# Patient Record
Sex: Male | Born: 1970 | Race: White | Hispanic: No | Marital: Married | Smoking: Never smoker
Health system: Southern US, Community
[De-identification: ages and names within clinical notes are randomized; demographics above are authoritative.]

## PROBLEM LIST (undated history)

## (undated) DIAGNOSIS — R740 Nonspecific elevation of levels of transaminase and lactic acid dehydrogenase [LDH]: Secondary | ICD-10-CM

## (undated) DIAGNOSIS — F419 Anxiety disorder, unspecified: Secondary | ICD-10-CM

## (undated) DIAGNOSIS — Z8619 Personal history of other infectious and parasitic diseases: Secondary | ICD-10-CM

## (undated) DIAGNOSIS — E785 Hyperlipidemia, unspecified: Secondary | ICD-10-CM

## (undated) DIAGNOSIS — K219 Gastro-esophageal reflux disease without esophagitis: Secondary | ICD-10-CM

## (undated) DIAGNOSIS — M1990 Unspecified osteoarthritis, unspecified site: Secondary | ICD-10-CM

## (undated) DIAGNOSIS — R7401 Elevation of levels of liver transaminase levels: Secondary | ICD-10-CM

## (undated) DIAGNOSIS — E049 Nontoxic goiter, unspecified: Secondary | ICD-10-CM

## (undated) DIAGNOSIS — E669 Obesity, unspecified: Secondary | ICD-10-CM

## (undated) DIAGNOSIS — E079 Disorder of thyroid, unspecified: Secondary | ICD-10-CM

## (undated) DIAGNOSIS — E042 Nontoxic multinodular goiter: Secondary | ICD-10-CM

## (undated) HISTORY — DX: Nonspecific elevation of levels of transaminase and lactic acid dehydrogenase (ldh): R74.0

## (undated) HISTORY — DX: Elevation of levels of liver transaminase levels: R74.01

## (undated) HISTORY — DX: Hyperlipidemia, unspecified: E78.5

## (undated) HISTORY — DX: Gastro-esophageal reflux disease without esophagitis: K21.9

## (undated) HISTORY — DX: Obesity, unspecified: E66.9

## (undated) HISTORY — DX: Nontoxic multinodular goiter: E04.2

## (undated) HISTORY — DX: Unspecified osteoarthritis, unspecified site: M19.90

## (undated) HISTORY — DX: Personal history of other infectious and parasitic diseases: Z86.19

## (undated) HISTORY — DX: Anxiety disorder, unspecified: F41.9

---

## 2006-07-13 HISTORY — PX: GANGLION CYST EXCISION: SHX1691

## 2008-09-03 LAB — IBC PANEL
Iron Saturation: 18
Iron: 56
TIBC: 315

## 2008-09-03 LAB — CERULOPLASMIN: Ceruloplasmin: 25.8

## 2008-09-03 LAB — ALPHA-1-ANTITRYPSIN: A-1 Antitrypsin, Ser: 109

## 2008-09-03 LAB — FERRITIN: Ferritin: 151 ng/mL (ref 18.0–300.0)

## 2008-09-03 LAB — HEPATITIS C VIRUS, RIBA: Hep C Virus Ab: <0.2

## 2009-08-08 LAB — TSH: TSH: 1.33 u[IU]/mL (ref 0.41–5.90)

## 2010-02-06 LAB — CBC AND DIFFERENTIAL: Hemoglobin: 16.1 g/dL (ref 13.5–17.5)

## 2010-09-02 LAB — LIPID PANEL
Cholesterol, Total: 181
Direct LDL: 73

## 2010-09-02 LAB — COMPREHENSIVE METABOLIC PANEL
ALT: 59 U/L — AB (ref 10–40)
AST: 22 U/L
Albumin: 4.5
Alkaline Phosphatase: 104 U/L
Calcium: 9.8 mg/dL
Glucose: 102

## 2010-11-27 ENCOUNTER — Encounter: Payer: Self-pay | Admitting: Family Medicine

## 2010-11-27 ENCOUNTER — Ambulatory Visit (INDEPENDENT_AMBULATORY_CARE_PROVIDER_SITE_OTHER): Payer: BC Managed Care – PPO | Admitting: Family Medicine

## 2010-11-27 DIAGNOSIS — E785 Hyperlipidemia, unspecified: Secondary | ICD-10-CM

## 2010-11-27 DIAGNOSIS — F419 Anxiety disorder, unspecified: Secondary | ICD-10-CM

## 2010-11-27 DIAGNOSIS — F411 Generalized anxiety disorder: Secondary | ICD-10-CM

## 2010-11-27 DIAGNOSIS — I10 Essential (primary) hypertension: Secondary | ICD-10-CM

## 2010-11-27 DIAGNOSIS — K219 Gastro-esophageal reflux disease without esophagitis: Secondary | ICD-10-CM | POA: Insufficient documentation

## 2010-11-27 HISTORY — DX: Anxiety disorder, unspecified: F41.9

## 2010-11-27 NOTE — Assessment & Plan Note (Signed)
Great control on 10mg  lisinopril. No hypotensive sxs. Continue currently, review records, return in 1-2 mo for recheck.  If staying low may back down on ACEI.

## 2010-11-27 NOTE — Assessment & Plan Note (Signed)
Xanax use about 1x/month.

## 2010-11-27 NOTE — Assessment & Plan Note (Signed)
Controlled with diet and with protonix PRN.

## 2010-11-27 NOTE — Patient Instructions (Signed)
Good to meet you today, call us with questions. Return in 1-2 months for follow up on blood pressure. We will request records from previous doctor.

## 2010-11-27 NOTE — Progress Notes (Signed)
  Subjective:    Patient ID: Erik Cole, male    DOB: 03-07-1971, 40 y.o.   MRN: [id number]  HPI CC: new patient, establish  Moved here for [place], [place] for work.  Works for [employer].  No concerns today.  HTN - tolerating lisinopril fine.  Small tickle in throat.  No cough.  Tolerating fine.  No dizziness, LOC, passing out. HLD - lipitor at night, no myalgias. GERD - protonix rarely.  Activity -walking 1x/wk a few miles, drinks water, good fruits and vegetables.  Getting settled in, trying to eat more healthy (previously a lot of eating out.)  Preventative: Unsure last tetanus, thinks within last 10 years.  Would like me to request records from prior pcp. Last CPE was last year, last blood work 07/2010.  Medications and allergies reviewed and updated as above. PMHx, SurgHx, SHx and FMHx reviewed for relevance and updated in chart.  Review of Systems  Constitutional: Negative for fever, chills, activity change, appetite change, fatigue and unexpected weight change.  HENT: Negative for hearing loss and neck pain.   Eyes: Negative for visual disturbance.  Respiratory: Negative for cough, choking, chest tightness, shortness of breath and wheezing.   Cardiovascular: Negative for chest pain, palpitations and leg swelling.  Gastrointestinal: Negative for nausea, vomiting, abdominal pain, diarrhea, constipation, blood in stool and abdominal distention.  Genitourinary: Negative for hematuria and difficulty urinating.  Musculoskeletal: Negative for myalgias and arthralgias.  Skin: Negative for rash.  Neurological: Negative for dizziness, seizures, syncope and headaches.  Hematological: Does not bruise/bleed easily.  Psychiatric/Behavioral: Negative for dysphoric mood. The patient is not nervous/anxious.        Objective:   Physical Exam  Nursing note and vitals reviewed. Constitutional: He is oriented to person, place, and time. He appears well-developed and well-nourished. No  distress.  HENT:  Head: Normocephalic and atraumatic.  Right Ear: External ear normal.  Left Ear: External ear normal.  Nose: Nose normal.  Mouth/Throat: Oropharynx is clear and moist.  Eyes: Conjunctivae and EOM are normal. Pupils are equal, round, and reactive to light. No scleral icterus.  Neck: Normal range of motion. Neck supple. No thyromegaly present.  Cardiovascular: Normal rate, regular rhythm, normal heart sounds and intact distal pulses.   No murmur heard. Pulses:      Radial pulses are 2+ on the right side, and 2+ on the left side.  Pulmonary/Chest: Effort normal and breath sounds normal. No respiratory distress. He has no wheezes. He has no rales.  Abdominal: Soft. Bowel sounds are normal. He exhibits no distension and no mass. There is no tenderness. There is no rebound and no guarding.  Musculoskeletal: Normal range of motion.  Lymphadenopathy:    He has no cervical adenopathy.  Neurological: He is alert and oriented to person, place, and time.       CN grossly intact, station and gait intact  Skin: Skin is warm and dry. No rash noted.  Psychiatric: He has a normal mood and affect. His behavior is normal. Judgment and thought content normal.          Assessment & Plan:

## 2010-11-27 NOTE — Assessment & Plan Note (Signed)
Takes lipitor nightly, tolerating fine.

## 2010-12-21 ENCOUNTER — Encounter: Payer: Self-pay | Admitting: Family Medicine

## 2010-12-30 ENCOUNTER — Encounter: Payer: Self-pay | Admitting: Family Medicine

## 2010-12-30 ENCOUNTER — Ambulatory Visit (INDEPENDENT_AMBULATORY_CARE_PROVIDER_SITE_OTHER): Payer: BC Managed Care – PPO | Admitting: Family Medicine

## 2010-12-30 DIAGNOSIS — E785 Hyperlipidemia, unspecified: Secondary | ICD-10-CM

## 2010-12-30 DIAGNOSIS — I10 Essential (primary) hypertension: Secondary | ICD-10-CM

## 2010-12-30 MED ORDER — LISINOPRIL 5 MG PO TABS: 5.0000 mg | ORAL_TABLET | Freq: Every day | ORAL | Status: DC

## 2010-12-30 NOTE — Patient Instructions (Addendum)
Back down on lisinopril to 5mg  daily (1/2 pill).  Keep track of blood pressure, ensure not consistently >140/90.  If that is case, call me. New prescription will be 5mg  daily. Continue other medicines for now.  Incorporate more healthy eating and we will check cholesterol levels in 3 months (return fasting). Return in 6 months for follow up visit. Good to see you today, call us with questions.

## 2010-12-30 NOTE — Progress Notes (Signed)
Subjective:    Patient ID: Erik Cole, male    DOB: Mar 20, 1971, 40 y.o.   MRN: [id number]  HPI CC: 1 mo f/u  HTN - on lisinopril 10mg  daily.  Since on this, tickle in throat (also recent cold, getting over).  No HA, vision changes, CP/tightness, SOB, leg swelling.    HLD - lipitor 20mg  nightly, no myalgias.  Tolerating well.  Also on fish oil 2-3 capsules daily.  Interested in diet changes.  Considering incorporating walks at night.  Hopeful with diet changes will be able to back off lipitor as doesn't want to take long term however does have family history of hypertriglyceridemia.  Reviewed previous blood work from 08/2010 and previous PCP with patient.    Patient Active Problem List  Diagnoses  . HTN (hypertension)  . HLD (hyperlipidemia)  . GERD (gastroesophageal reflux disease)  . Anxiety   Past Medical History  Diagnosis Date  . HTN (hypertension)   . HLD (hyperlipidemia)     previous PCP stopped tricor 2/2 elevated LFTs 07/2010  . GERD (gastroesophageal reflux disease)   . History of chicken pox   . Transaminitis     mild, w/u ~2008 neg HbsAg, Hep C, ANA, nl iron/ferritin, ceruloplasmin, a1-antitrypsin   Past Surgical History  Procedure Date  . Ganglion cyst excision 2008    R wrist   History  Substance Use Topics  . Smoking status: Never Smoker   . Smokeless tobacco: Never Used  . Alcohol Use: Yes     Rare   Family History  Problem Relation Age of Onset  . Hypertension Mother   . Hyperlipidemia Father     TG  . Diabetes Maternal Grandmother   . Coronary artery disease Paternal Grandfather 55    unhealthy  . Stroke Neg Hx   . Breast cancer Other 80    great grandmother, maternal   No Known Allergies Current Outpatient Prescriptions on File Prior to Visit  Medication Sig Dispense Refill  . ALPRAZolam (XANAX) 0.5 MG tablet Take 0.5-1 mg by mouth daily as needed.        Marland Kitchen atorvastatin (LIPITOR) 20 MG tablet Take 20 mg by mouth daily.        . fish  oil-omega-3 fatty acids 1000 MG capsule Take 2 g by mouth daily.        Marland Kitchen lisinopril (PRINIVIL,ZESTRIL) 10 MG tablet Take 10 mg by mouth daily.        . pantoprazole (PROTONIX) 40 MG tablet Take 40 mg by mouth daily as needed.        Review of Systems Per HPI    Objective:   Physical Exam  Nursing note and vitals reviewed. Constitutional: He appears well-developed and well-nourished. No distress.  HENT:  Head: Normocephalic and atraumatic.  Mouth/Throat: Oropharynx is clear and moist. No oropharyngeal exudate.  Eyes: Conjunctivae and EOM are normal. Pupils are equal, round, and reactive to light. No scleral icterus.  Neck: Normal range of motion. Neck supple. Carotid bruit is not present. No thyromegaly present.  Cardiovascular: Normal rate, regular rhythm, normal heart sounds and intact distal pulses.   No murmur heard. Pulmonary/Chest: Effort normal and breath sounds normal. No respiratory distress. He has no wheezes. He has no rales.  Abdominal:       No abd/renal bruits  Lymphadenopathy:    He has no cervical adenopathy.  Skin: Skin is warm and dry. No rash noted.          Assessment & Plan:

## 2010-12-30 NOTE — Assessment & Plan Note (Signed)
Mainly hypertriglyceridemia with trig 380s.  On fish oil and statin. Previous use of fibrates caused elevated LFTs. Provided with low cholesterol diet, discussed healthy eating and incorporating exercise. continue to monitor, recheck in 3 months.  Then readdress need for lipitor.

## 2010-12-30 NOTE — Assessment & Plan Note (Signed)
BP Readings from Last 3 Encounters:  12/30/10 102/80  11/27/10 108/70  Continues good control.  Decreased lisinopril to 5mg  daily.  Pt states has never had elevated blood pressure. No hypotension sxs. Pt hopeful to come off med.

## 2011-04-02 ENCOUNTER — Other Ambulatory Visit (INDEPENDENT_AMBULATORY_CARE_PROVIDER_SITE_OTHER): Payer: BC Managed Care – PPO

## 2011-04-02 DIAGNOSIS — E785 Hyperlipidemia, unspecified: Secondary | ICD-10-CM

## 2011-04-02 DIAGNOSIS — I10 Essential (primary) hypertension: Secondary | ICD-10-CM

## 2011-04-02 LAB — COMPREHENSIVE METABOLIC PANEL
BUN: 15 mg/dL (ref 6–23)
CO2: 29 mEq/L (ref 19–32)
Creatinine, Ser: 0.9 mg/dL (ref 0.4–1.5)
GFR: 96.95 mL/min (ref >60.00)
Glucose, Bld: 88 mg/dL (ref 70–99)
Sodium: 138 mEq/L (ref 135–145)
Total Bilirubin: 0.8 mg/dL (ref 0.3–1.2)
Total Protein: 7.6 g/dL (ref 6.0–8.3)

## 2011-04-02 LAB — LIPID PANEL
Cholesterol: 151 mg/dL (ref 0–200)
Triglycerides: 270 mg/dL — ABNORMAL HIGH (ref 0.0–149.0)

## 2011-04-15 ENCOUNTER — Ambulatory Visit (INDEPENDENT_AMBULATORY_CARE_PROVIDER_SITE_OTHER): Payer: BC Managed Care – PPO

## 2011-04-15 ENCOUNTER — Other Ambulatory Visit: Payer: Self-pay | Admitting: *Deleted

## 2011-04-15 DIAGNOSIS — Z23 Encounter for immunization: Secondary | ICD-10-CM

## 2011-04-15 MED ORDER — ATORVASTATIN CALCIUM 20 MG PO TABS: 20.0000 mg | ORAL_TABLET | Freq: Every day | ORAL | Status: DC

## 2011-07-02 ENCOUNTER — Encounter: Payer: Self-pay | Admitting: Family Medicine

## 2011-07-02 ENCOUNTER — Ambulatory Visit (INDEPENDENT_AMBULATORY_CARE_PROVIDER_SITE_OTHER): Payer: BC Managed Care – PPO | Admitting: Family Medicine

## 2011-07-02 VITALS — BP 118/82 | HR 72 | Temp 98.7°F | Ht 71.0 in | Wt 233.8 lb

## 2011-07-02 DIAGNOSIS — I10 Essential (primary) hypertension: Secondary | ICD-10-CM

## 2011-07-02 DIAGNOSIS — E669 Obesity, unspecified: Secondary | ICD-10-CM

## 2011-07-02 DIAGNOSIS — E785 Hyperlipidemia, unspecified: Secondary | ICD-10-CM

## 2011-07-02 DIAGNOSIS — E66811 Obesity, class 1: Secondary | ICD-10-CM

## 2011-07-02 HISTORY — DX: Obesity, class 1: E66.811

## 2011-07-02 MED ORDER — OMEGA-3 FATTY ACIDS 1000 MG PO CAPS: 3.0000 g | ORAL_CAPSULE | Freq: Every day | ORAL | Status: DC

## 2011-07-02 MED ORDER — ALPRAZOLAM 0.5 MG PO TABS: 0.5000 mg | ORAL_TABLET | Freq: Every day | ORAL | Status: DC | PRN

## 2011-07-02 NOTE — Assessment & Plan Note (Signed)
Body mass index is 32.60 kg/(m^2). Encouraged weight loss.  Pt considering bike trainer.

## 2011-07-02 NOTE — Patient Instructions (Signed)
Stop lisinopril. Keep eye on blood pressure intermittently as able.  Let me know if running >140/90. Increase fish oil to 3gm daily. Return in 6 months for physical and follow up, come in a few days prior fasting for blood work.

## 2011-07-02 NOTE — Assessment & Plan Note (Signed)
Hypertriglyceridemia. On fish oil and statin.  Increase fish oil to 3gm daily. Previous tricor caused transaminitis. Has decreased levels well.  Recheck 6 mo.

## 2011-07-02 NOTE — Progress Notes (Signed)
  Subjective:    Patient ID: Erik Cole, male    DOB: 1970-08-08, 40 y.o.   MRN: [id number]  HPI CC: 6 mo f/u   HTN - actually off lisinopril for 1 wk and normal BP.  Doesn't check at home.  No HA, vision changes, CP/tightness, SOB, leg swelling.  Anticipate doesn't truly have htn, likely elevated readings in past 2/2 stress level.  HLD - compliant with lipitor 20mg  at bedtime.  No myalgias.  Also taking fish oil 2gm daily.  Reviewed blood work.  GERD - rarely uses protonix.  Knows foods to avoid.  Flu shot 2012  Wt Readings from Last 3 Encounters:  07/02/11 233 lb 12 oz (106.028 kg)  12/30/10 220 lb 1.9 oz (99.846 kg)  11/27/10 218 lb 1.9 oz (98.939 kg)  13lb weight gain.  No regular exercise.  Thinking about bike trainer.  Body mass index is 32.60 kg/(m^2).  Avoids EtOH, red meat.  Does eat processed foods.  Review of Systems Per HPI    Objective:   Physical Exam  Nursing note and vitals reviewed. Constitutional: He appears well-developed and well-nourished. No distress.  HENT:  Head: Normocephalic and atraumatic.  Mouth/Throat: Oropharynx is clear and moist. No oropharyngeal exudate.  Eyes: Conjunctivae and EOM are normal. Pupils are equal, round, and reactive to light. No scleral icterus.  Neck: Normal range of motion. Neck supple. Carotid bruit is not present.  Cardiovascular: Normal rate, regular rhythm, normal heart sounds and intact distal pulses.   No murmur heard. Pulmonary/Chest: Effort normal and breath sounds normal. No respiratory distress. He has no wheezes. He has no rales.  Musculoskeletal: He exhibits no edema.  Lymphadenopathy:    He has no cervical adenopathy.  Skin: Skin is warm and dry. No rash noted.       Assessment & Plan:

## 2011-07-02 NOTE — Assessment & Plan Note (Signed)
Off antihypertensives for 1 wk, bp well controlled. Anticipate normotensive, will remove dx from list. Advised pt to keep track of BP's at store, update [place] if elevated.

## 2011-08-03 ENCOUNTER — Emergency Department
Admission: EM | Admit: 2011-08-03 | Discharge: 2011-08-03 | Disposition: A | Discharge status: Home / Self Care | Payer: BC Managed Care – PPO | Source: Home / Self Care | Attending: Emergency Medicine | Admitting: Emergency Medicine

## 2011-08-03 ENCOUNTER — Encounter: Payer: Self-pay | Admitting: *Deleted

## 2011-08-03 DIAGNOSIS — J069 Acute upper respiratory infection, unspecified: Secondary | ICD-10-CM

## 2011-08-03 DIAGNOSIS — R059 Cough, unspecified: Secondary | ICD-10-CM

## 2011-08-03 DIAGNOSIS — R05 Cough: Secondary | ICD-10-CM

## 2011-08-03 MED ORDER — PREDNISONE (PAK) 10 MG PO TABS: 10.0000 mg | ORAL_TABLET | Freq: Every day | ORAL | Status: AC

## 2011-08-03 MED ORDER — BENZONATATE 200 MG PO CAPS: 200.0000 mg | ORAL_CAPSULE | Freq: Three times a day (TID) | ORAL | Status: AC | PRN

## 2011-08-03 NOTE — ED Notes (Signed)
Pt states that he had a cold on 12/25 and he c/o lingering dry cough. He has taken alka seltzer and mucinex with no relief.

## 2011-08-03 NOTE — ED Provider Notes (Signed)
History     CSN: [id number]  Arrival date & time 08/03/11  1649   First MD Initiated Contact with Patient 08/03/11 1659      No chief complaint on file.   (Consider location/radiation/quality/duration/timing/severity/associated sxs/prior treatment) HPI Omkar is a 41 y.o. male who complains of onset of cold symptoms for a few weeks. He was sick a few weeks ago and got over it however he has a lingering cough that is certainly become a nuisance. He states that he frequently has the same symptoms but his wife is starting to complain about it. No sore throat + dry hacking cough No pleuritic pain No wheezing No nasal congestion No post-nasal drainage No sinus pain/pressure No chest congestion No itchy/red eyes No earache No hemoptysis No SOB No chills/sweats No fever No nausea No vomiting No abdominal pain No diarrhea No skin rashes No fatigue No myalgias No headache    Past Medical History  Diagnosis Date  . HLD (hyperlipidemia)     previous PCP stopped tricor 2/2 elevated LFTs 07/2010  . GERD (gastroesophageal reflux disease)   . History of chicken pox   . Transaminitis     hx mild, w/u ~2008 neg HbsAg, Hep C, ANA, nl iron/ferritin, ceruloplasmin, a1-antitrypsin  . Anxiety     mild, xanax sparingly  . Obesity     Past Surgical History  Procedure Date  . Ganglion cyst excision 2008    R wrist    Family History  Problem Relation Age of Onset  . Hypertension Mother   . Hyperlipidemia Father     TG  . Diabetes Maternal Grandmother   . Coronary artery disease Paternal Grandfather 55    unhealthy  . Stroke Neg Hx   . Breast cancer Other 36    great grandmother, maternal    History  Substance Use Topics  . Smoking status: Never Smoker   . Smokeless tobacco: Never Used  . Alcohol Use: Yes     Rare      Review of Systems  Allergies  Review of patient's allergies indicates no known allergies.  Home Medications   Current Outpatient Rx  Name  Route Sig Dispense Refill  . ALPRAZOLAM 0.5 MG PO TABS Oral Take 1 tablet (0.5 mg total) by mouth daily as needed. 30 tablet 0  . ATORVASTATIN CALCIUM 20 MG PO TABS Oral Take 1 tablet (20 mg total) by mouth daily. 90 tablet 2  . OMEGA-3 FATTY ACIDS 1000 MG PO CAPS Oral Take 3 capsules (3 g total) by mouth daily.    Marland Kitchen PANTOPRAZOLE SODIUM 40 MG PO TBEC Oral Take 40 mg by mouth daily as needed.       There were no vitals taken for this visit.  Physical Exam  Nursing note and vitals reviewed. Constitutional: He is oriented to person, place, and time. He appears well-developed and well-nourished.  HENT:  Head: Normocephalic and atraumatic.  Right Ear: Tympanic membrane, external ear and ear canal normal.  Left Ear: Tympanic membrane, external ear and ear canal normal.  Nose: Nose normal.  Mouth/Throat: Posterior oropharyngeal erythema (mild clear postnasal drip) present. No oropharyngeal exudate or posterior oropharyngeal edema.  Eyes: No scleral icterus.  Neck: Neck supple.  Cardiovascular: Regular rhythm and normal heart sounds.   Pulmonary/Chest: Effort normal and breath sounds normal. No respiratory distress.  Neurological: He is alert and oriented to person, place, and time.  Skin: Skin is warm and dry.  Psychiatric: He has a normal mood and affect. His  speech is normal.    ED Course  Procedures (including critical care time)  Labs Reviewed - No data to display No results found.   No diagnosis found.    MDM  1)  no antibiotic was given today since is likely viral or postviral. Instead I gave her prescription for prednisone as well as Tessalon. He may want to also consider getting a medicine for reflux since he does have a history of it. 2)  Use nasal saline solution (over the counter) at least 3 times a day. 3)  Follow up with your primary doctor if no improvement in 5-7 days, sooner if increasing pain, fever, or new symptoms.     Lily Kocher, MD 08/03/11  [phone]

## 2011-12-15 ENCOUNTER — Other Ambulatory Visit: Payer: Self-pay | Admitting: Family Medicine

## 2011-12-15 DIAGNOSIS — E785 Hyperlipidemia, unspecified: Secondary | ICD-10-CM

## 2011-12-24 ENCOUNTER — Other Ambulatory Visit: Payer: BC Managed Care – PPO

## 2011-12-25 ENCOUNTER — Other Ambulatory Visit (INDEPENDENT_AMBULATORY_CARE_PROVIDER_SITE_OTHER): Payer: BC Managed Care – PPO

## 2011-12-25 DIAGNOSIS — E785 Hyperlipidemia, unspecified: Secondary | ICD-10-CM

## 2011-12-25 LAB — LIPID PANEL
Cholesterol: 166 mg/dL (ref 0–200)
Total CHOL/HDL Ratio: 4
VLDL: 74.6 mg/dL — ABNORMAL HIGH (ref 0.0–40.0)

## 2011-12-25 LAB — LDL CHOLESTEROL, DIRECT: Direct LDL: 70.8 mg/dL

## 2011-12-25 LAB — COMPREHENSIVE METABOLIC PANEL
ALT: 50 U/L (ref 0–53)
AST: 32 U/L (ref 0–37)
CO2: 28 mEq/L (ref 19–32)
Calcium: 9.5 mg/dL (ref 8.4–10.5)
Chloride: 104 mEq/L (ref 96–112)
Creatinine, Ser: 1 mg/dL (ref 0.4–1.5)
Sodium: 139 mEq/L (ref 135–145)
Total Protein: 7.5 g/dL (ref 6.0–8.3)

## 2011-12-31 ENCOUNTER — Encounter: Payer: Self-pay | Admitting: Family Medicine

## 2011-12-31 ENCOUNTER — Ambulatory Visit (INDEPENDENT_AMBULATORY_CARE_PROVIDER_SITE_OTHER): Payer: BC Managed Care – PPO | Admitting: Family Medicine

## 2011-12-31 VITALS — BP 137/94 | HR 78 | Temp 98.5°F | Ht 71.0 in | Wt 233.8 lb

## 2011-12-31 DIAGNOSIS — E785 Hyperlipidemia, unspecified: Secondary | ICD-10-CM

## 2011-12-31 DIAGNOSIS — K219 Gastro-esophageal reflux disease without esophagitis: Secondary | ICD-10-CM

## 2011-12-31 DIAGNOSIS — E669 Obesity, unspecified: Secondary | ICD-10-CM

## 2011-12-31 DIAGNOSIS — I10 Essential (primary) hypertension: Secondary | ICD-10-CM

## 2011-12-31 DIAGNOSIS — Z0000 Encounter for general adult medical examination without abnormal findings: Secondary | ICD-10-CM

## 2011-12-31 DIAGNOSIS — K148 Other diseases of tongue: Secondary | ICD-10-CM | POA: Insufficient documentation

## 2011-12-31 HISTORY — DX: Encounter for general adult medical examination without abnormal findings: Z00.00

## 2011-12-31 MED ORDER — NYSTATIN 100000 UNIT/ML MT SUSP: 500000.0000 [IU] | Freq: Four times a day (QID) | OROMUCOSAL | Status: AC

## 2011-12-31 NOTE — Progress Notes (Signed)
Subjective:    Patient ID: Erik Cole, male    DOB: August 26, 1970, 41 y.o.   MRN: [id number]  HPI CC: annual exam  Abscessed tooth recently, s/p 10 d clindamycin.  Tongue staying yellow.  Swished with dilute H2O2 and sxs improved.  Would like this checked today.  Has upcoming tooth extraction planned this weekend.  To take amoxicillin perioperationally.   Wt Readings from Last 3 Encounters:  12/31/11 233 lb 12 oz (106.028 kg)  08/03/11 231 lb 8 oz (105.008 kg)  07/02/11 233 lb 12 oz (106.028 kg)   BP Readings from Last 3 Encounters:  12/31/11 137/94  08/03/11 128/85  07/02/11 118/82  off bp meds for last 6-8 months.  Doesn't check at home.  Preventative: No recent CPE Tetanus done 10/2009 Flu shot - yearly Seat belt 100% Sunscreen use discussed.  Caffeine: occasionally. Lives with wife [name]), 2 children (son and daughter), 1 cat Occupation:  Activity: no regular exercise Diet: good water, daily fruits/vegetables, red meat rarely, fish 1x/wk, cut out fast food  Medications and allergies reviewed and updated in chart.  Past histories reviewed and updated if relevant as below. Patient Active Problem List  Diagnosis  . HTN (hypertension)  . HLD (hyperlipidemia)  . GERD (gastroesophageal reflux disease)  . Anxiety  . Obesity   Past Medical History  Diagnosis Date  . HLD (hyperlipidemia)     previous PCP stopped tricor 2/2 elevated LFTs 07/2010  . GERD (gastroesophageal reflux disease)   . History of chicken pox   . Transaminitis     hx mild, w/u ~2008 neg HbsAg, Hep C, ANA, nl iron/ferritin, ceruloplasmin, a1-antitrypsin  . Anxiety     mild, xanax sparingly  . Obesity    Past Surgical History  Procedure Date  . Ganglion cyst excision 2008    R wrist   History  Substance Use Topics  . Smoking status: Never Smoker   . Smokeless tobacco: Never Used  . Alcohol Use: Yes     Rare   Family History  Problem Relation Age of Onset  . Hypertension Mother     . Hyperlipidemia Father     TG  . Diabetes Maternal Grandmother   . Coronary artery disease Paternal Grandfather 55    unhealthy  . Stroke Neg Hx   . Breast cancer Other 80    great grandmother, maternal   No Known Allergies Current Outpatient Prescriptions on File Prior to Visit  Medication Sig Dispense Refill  . ALPRAZolam (XANAX) 0.5 MG tablet Take 1 tablet (0.5 mg total) by mouth daily as needed.  30 tablet  0  . atorvastatin (LIPITOR) 20 MG tablet Take 1 tablet (20 mg total) by mouth daily.  90 tablet  2  . cetirizine (ZYRTEC) 10 MG tablet Take 10 mg by mouth daily.      . pantoprazole (PROTONIX) 40 MG tablet Take 40 mg by mouth daily as needed.          Review of Systems  Constitutional: Negative for fever, chills, activity change, appetite change, fatigue and unexpected weight change.  HENT: Negative for hearing loss and neck pain.   Eyes: Negative for visual disturbance.  Respiratory: Negative for cough, chest tightness, shortness of breath and wheezing.   Cardiovascular: Negative for chest pain, palpitations and leg swelling.  Gastrointestinal: Negative for nausea, vomiting, abdominal pain, diarrhea, constipation, blood in stool and abdominal distention.  Genitourinary: Negative for hematuria and difficulty urinating.  Musculoskeletal: Negative for myalgias and arthralgias.  Skin: Negative  for rash.  Neurological: Negative for dizziness, seizures, syncope and headaches.  Hematological: Does not bruise/bleed easily.  Psychiatric/Behavioral: Negative for dysphoric mood. The patient is not nervous/anxious.        Objective:   Physical Exam  Nursing note and vitals reviewed. Constitutional: He is oriented to person, place, and time. He appears well-developed and well-nourished. No distress.  HENT:  Head: Normocephalic and atraumatic.  Right Ear: Hearing, tympanic membrane, external ear and ear canal normal.  Left Ear: Hearing, tympanic membrane, external ear and ear  canal normal.  Nose: Nose normal.  Mouth/Throat: Oropharynx is clear and moist. No oropharyngeal exudate.       Slight yellowing of tongue, but no white lesions  Eyes: Conjunctivae and EOM are normal. Pupils are equal, round, and reactive to light. No scleral icterus.  Neck: Normal range of motion. Neck supple.  Cardiovascular: Normal rate, regular rhythm, normal heart sounds and intact distal pulses.   No murmur heard. Pulses:      Radial pulses are 2+ on the right side, and 2+ on the left side.  Pulmonary/Chest: Effort normal and breath sounds normal. No respiratory distress. He has no wheezes. He has no rales.  Abdominal: Soft. Bowel sounds are normal. He exhibits no distension and no mass. There is no tenderness. There is no rebound and no guarding.  Musculoskeletal: Normal range of motion. He exhibits no edema.  Lymphadenopathy:    He has no cervical adenopathy.  Neurological: He is alert and oriented to person, place, and time.       CN grossly intact, station and gait intact  Skin: Skin is warm and dry. No rash noted.  Psychiatric: He has a normal mood and affect. His behavior is normal. Judgment and thought content normal.       Assessment & Plan:

## 2011-12-31 NOTE — Patient Instructions (Addendum)
Good to see you today, call us with quesitons. Increase fish oil to 3 pills daily - try to increase fish in diet as well.   Triglyceride levels were somewhat high today. Return as needed or in 1 year for next physical. Try to keep eye on blood pressure at pharmacy or store and if consistently >140/90, please let me know. Try to get more exercise in regularly.

## 2011-12-31 NOTE — Assessment & Plan Note (Signed)
Anticipate mild case of thrush after recent abx use (clinda).  As about to take 2nd course of abx, provided with nystatin swish/swallow WASP.

## 2011-12-31 NOTE — Assessment & Plan Note (Signed)
Stable off bp meds. Today somewhat elevated. Asked keep track at store and update [place] if elevated.

## 2011-12-31 NOTE — Assessment & Plan Note (Signed)
Diet controlled and PPI PRN.

## 2011-12-31 NOTE — Assessment & Plan Note (Addendum)
Chronic.  Reviewed #s.  Great LDL, low HDL Trig elevated. rec increase fish oil to 3 pills daily, increase fish intake in diet.   discussed lovaza, pt prefers OTC option. Continue lipitor. Previous tricor caused transaminitis.

## 2011-12-31 NOTE — Assessment & Plan Note (Signed)
Preventative protocols reviewed and updated unless pt declined. Discussed healthy diet, lifestyle. Body mass index is 32.60 kg/(m^2).

## 2011-12-31 NOTE — Assessment & Plan Note (Signed)
Body mass index is 32.60 kg/(m^2).  Encouraged weight loss through diet changes and increased regular exercise.

## 2012-06-03 ENCOUNTER — Other Ambulatory Visit: Payer: Self-pay | Admitting: Family Medicine

## 2012-08-01 ENCOUNTER — Other Ambulatory Visit: Payer: Self-pay | Admitting: Family Medicine

## 2012-08-01 NOTE — Telephone Encounter (Signed)
Rx called in as directed.   

## 2012-08-01 NOTE — Telephone Encounter (Signed)
plz phone in. 

## 2012-12-23 ENCOUNTER — Other Ambulatory Visit: Payer: Self-pay | Admitting: Family Medicine

## 2012-12-23 DIAGNOSIS — E785 Hyperlipidemia, unspecified: Secondary | ICD-10-CM

## 2012-12-23 DIAGNOSIS — I10 Essential (primary) hypertension: Secondary | ICD-10-CM

## 2012-12-26 ENCOUNTER — Encounter: Payer: Self-pay | Admitting: *Deleted

## 2012-12-27 ENCOUNTER — Other Ambulatory Visit (INDEPENDENT_AMBULATORY_CARE_PROVIDER_SITE_OTHER): Payer: BC Managed Care – PPO

## 2012-12-27 DIAGNOSIS — E785 Hyperlipidemia, unspecified: Secondary | ICD-10-CM

## 2012-12-27 DIAGNOSIS — I10 Essential (primary) hypertension: Secondary | ICD-10-CM

## 2012-12-27 LAB — COMPREHENSIVE METABOLIC PANEL
ALT: 36 U/L (ref 0–53)
Alkaline Phosphatase: 75 U/L (ref 39–117)
Glucose, Bld: 91 mg/dL (ref 70–99)
Sodium: 140 mEq/L (ref 135–145)
Total Bilirubin: 0.9 mg/dL (ref 0.3–1.2)
Total Protein: 7.9 g/dL (ref 6.0–8.3)

## 2012-12-27 LAB — LIPID PANEL
HDL: 35.1 mg/dL — ABNORMAL LOW (ref >39.00)
Total CHOL/HDL Ratio: 5
Triglycerides: 350 mg/dL — ABNORMAL HIGH (ref 0.0–149.0)

## 2013-01-03 ENCOUNTER — Other Ambulatory Visit: Payer: BC Managed Care – PPO

## 2013-01-03 ENCOUNTER — Encounter: Payer: Self-pay | Admitting: Radiology

## 2013-01-03 ENCOUNTER — Encounter: Payer: BC Managed Care – PPO | Admitting: Family Medicine

## 2013-01-04 ENCOUNTER — Ambulatory Visit (INDEPENDENT_AMBULATORY_CARE_PROVIDER_SITE_OTHER): Payer: BC Managed Care – PPO | Admitting: Family Medicine

## 2013-01-04 ENCOUNTER — Encounter: Payer: Self-pay | Admitting: Family Medicine

## 2013-01-04 VITALS — BP 118/86 | HR 80 | Temp 97.7°F | Ht 71.0 in | Wt 236.2 lb

## 2013-01-04 DIAGNOSIS — I10 Essential (primary) hypertension: Secondary | ICD-10-CM

## 2013-01-04 DIAGNOSIS — R1011 Right upper quadrant pain: Secondary | ICD-10-CM | POA: Insufficient documentation

## 2013-01-04 DIAGNOSIS — K219 Gastro-esophageal reflux disease without esophagitis: Secondary | ICD-10-CM

## 2013-01-04 DIAGNOSIS — E785 Hyperlipidemia, unspecified: Secondary | ICD-10-CM

## 2013-01-04 DIAGNOSIS — E669 Obesity, unspecified: Secondary | ICD-10-CM

## 2013-01-04 DIAGNOSIS — Z0000 Encounter for general adult medical examination without abnormal findings: Secondary | ICD-10-CM

## 2013-01-04 DIAGNOSIS — F419 Anxiety disorder, unspecified: Secondary | ICD-10-CM

## 2013-01-04 DIAGNOSIS — F411 Generalized anxiety disorder: Secondary | ICD-10-CM

## 2013-01-04 MED ORDER — ATORVASTATIN CALCIUM 40 MG PO TABS: 40.0000 mg | ORAL_TABLET | Freq: Every day | ORAL | Status: DC

## 2013-01-04 NOTE — Assessment & Plan Note (Signed)
Encouraged start regular exercise on weekends in form of brisk walking

## 2013-01-04 NOTE — Assessment & Plan Note (Signed)
BP stable off meds - will remove from problem list

## 2013-01-04 NOTE — Assessment & Plan Note (Signed)
Preventative protocols reviewed and updated unless pt declined. Discussed healthy diet and lifestyle.  Body mass index is 32.96 kg/(m^2).

## 2013-01-04 NOTE — Progress Notes (Signed)
Subjective:    Patient ID: Erik Cole, male    DOB: August 27, 1970, 42 y.o.   MRN: [id number]  HPI CC: CPE  Occasional RUQ pain described as cramping that travels to back.  Lasts hours to days.  Every few months.   No fevers/chills, nausea/vomiting, indigestion, bloating. May be related to nut intake. Mainly during day. Not worse at night or with greasy fatty meals. Improved with stretching and laying flat. At its worse 5/10 pain.  GERD - controlled off meds.  Hypertriglyceridemia - elevated to 300s.  Has been on tricor in past, thinks caused transaminitis.  Takes 2 capsules per day of fish oil.  Preventative:  No recent CPE  Tetanus done 10/2009 Flu shot - yearly   Seat belt 100%  Sunscreen use discussed.   Caffeine: occasionally.  Lives with wife [name]), 2 children (son and daughter), 1 cat  Occupation: Psychologist, counselling at [employer] Activity: no regular exercise, walks at work. Diet: good water, daily fruits/vegetables, red meat rarely, fish 1x/wk, cut out fast food    Medications and allergies reviewed and updated in chart.  Past histories reviewed and updated if relevant as below. Patient Active Problem List   Diagnosis Date Noted  . Healthcare maintenance 12/31/2011  . Tongue lesion 12/31/2011  . Obesity 07/02/2011  . Anxiety, mild 11/27/2010  . HTN (hypertension)   . Dyslipidemia   . GERD (gastroesophageal reflux disease)    Past Medical History  Diagnosis Date  . HLD (hyperlipidemia)     previous PCP stopped tricor 2/2 elevated LFTs 07/2010  . GERD (gastroesophageal reflux disease)   . History of chicken pox   . Transaminitis     hx mild, w/u ~2008 neg HbsAg, Hep C, ANA, nl iron/ferritin, ceruloplasmin, a1-antitrypsin, normal [place]  . Anxiety     mild, xanax sparingly  . Obesity    Past Surgical History  Procedure Laterality Date  . Ganglion cyst excision  2008    R wrist   History  Substance Use Topics  . Smoking status: Never Smoker   .  Smokeless tobacco: Never Used  . Alcohol Use: Yes     Comment: Rare   Family History  Problem Relation Age of Onset  . Hypertension Mother   . Hyperlipidemia Father     TG  . Diabetes Maternal Grandmother   . Coronary artery disease Paternal Grandfather 55    unhealthy  . Stroke Neg Hx   . Breast cancer Other 80    great grandmother, maternal   No Known Allergies Current Outpatient Prescriptions on File Prior to Visit  Medication Sig Dispense Refill  . ALPRAZolam (XANAX) 0.5 MG tablet TAKE ONE (1) TABLET BY MOUTH DAILY AS NEEDED  30 tablet  0  . atorvastatin (LIPITOR) 20 MG tablet TAKE ONE (1) TABLET BY MOUTH EVERY      DAY  90 tablet  1  . cetirizine (ZYRTEC) 10 MG tablet Take 10 mg by mouth daily.      . Omega-3 Fatty Acids (FISH OIL) 1200 MG CAPS Take 2 capsules by mouth daily.        No current facility-administered medications on file prior to visit.    Review of Systems  Constitutional: Negative for fever, chills, activity change, appetite change, fatigue and unexpected weight change.  HENT: Negative for hearing loss and neck pain.   Eyes: Negative for visual disturbance.  Respiratory: Negative for cough, chest tightness, shortness of breath and wheezing.   Cardiovascular: Negative for chest pain, palpitations and leg  swelling.  Gastrointestinal: Negative for nausea, vomiting, abdominal pain, diarrhea, constipation, blood in stool and abdominal distention.  Genitourinary: Negative for hematuria and difficulty urinating.  Musculoskeletal: Negative for myalgias and arthralgias.  Skin: Negative for rash.  Neurological: Negative for dizziness, seizures, syncope and headaches.  Hematological: Negative for adenopathy. Does not bruise/bleed easily.  Psychiatric/Behavioral: Negative for dysphoric mood. The patient is not nervous/anxious.        Objective:   Physical Exam  Nursing note and vitals reviewed. Constitutional: He is oriented to person, place, and time. He appears  well-developed and well-nourished. No distress.  HENT:  Head: Normocephalic and atraumatic.  Nose: Nose normal.  Mouth/Throat: No oropharyngeal exudate.  Eyes: Conjunctivae and EOM are normal. Pupils are equal, round, and reactive to light. No scleral icterus.  Neck: Normal range of motion. Neck supple. Carotid bruit is not present.  Cardiovascular: Normal rate, regular rhythm, normal heart sounds and intact distal pulses.   No murmur heard. Pulses:      Radial pulses are 2+ on the right side, and 2+ on the left side.  Pulmonary/Chest: Effort normal and breath sounds normal. No respiratory distress. He has no wheezes. He has no rales.  Abdominal: Soft. Normal appearance and bowel sounds are normal. He exhibits no distension and no mass. There is no hepatosplenomegaly. There is no tenderness. There is no rigidity, no rebound, no guarding and negative Murphy's sign.  Musculoskeletal: Normal range of motion. He exhibits no edema.  Lymphadenopathy:    He has no cervical adenopathy.  Neurological: He is alert and oriented to person, place, and time.  CN grossly intact, station and gait intact  Skin: Skin is warm and dry. No rash noted.  Psychiatric: He has a normal mood and affect. His behavior is normal. Judgment and thought content normal.       Assessment & Plan:

## 2013-01-04 NOTE — Assessment & Plan Note (Signed)
Minimal

## 2013-01-04 NOTE — Assessment & Plan Note (Signed)
Sounds more musculoskeletal, not consistent with gallstones. Had abd [place] 2010 - unrevealing per patient. Will monitor for now.

## 2013-01-04 NOTE — Assessment & Plan Note (Signed)
Chronic, persistent hypertriglyceridemia on lipitor. Consider lovaza. Try to increase fish oil to 3 pills daily. Increase lipitor to 40mg  daily and reassess. tricor caused transaminitis.

## 2013-01-04 NOTE — Patient Instructions (Addendum)
Increase lipitor to 40mg  daily as triglyceride levels remain very elevated.  Let me know if pain worsens or changes. Return in 2-3 months to recheck cholesterol and liver function. Good to see you today, call us with questions.

## 2013-01-04 NOTE — Assessment & Plan Note (Signed)
Minimal.  Xanax use about every few months.  Did not update controlled substance agreement.

## 2013-03-24 ENCOUNTER — Other Ambulatory Visit: Payer: Self-pay | Admitting: Family Medicine

## 2013-03-24 DIAGNOSIS — E785 Hyperlipidemia, unspecified: Secondary | ICD-10-CM

## 2013-03-29 ENCOUNTER — Other Ambulatory Visit (INDEPENDENT_AMBULATORY_CARE_PROVIDER_SITE_OTHER): Payer: BC Managed Care – PPO

## 2013-03-29 DIAGNOSIS — E669 Obesity, unspecified: Secondary | ICD-10-CM

## 2013-03-29 DIAGNOSIS — E785 Hyperlipidemia, unspecified: Secondary | ICD-10-CM

## 2013-03-29 LAB — HEPATIC FUNCTION PANEL
Albumin: 4.2 g/dL (ref 3.5–5.2)
Alkaline Phosphatase: 74 U/L (ref 39–117)
Total Protein: 7.4 g/dL (ref 6.0–8.3)

## 2013-03-29 LAB — LIPID PANEL
Cholesterol: 156 mg/dL (ref 0–200)
HDL: 29.8 mg/dL — ABNORMAL LOW (ref >39.00)

## 2013-04-05 ENCOUNTER — Ambulatory Visit: Payer: BC Managed Care – PPO | Admitting: Family Medicine

## 2013-04-11 ENCOUNTER — Encounter: Payer: Self-pay | Admitting: Family Medicine

## 2013-04-11 ENCOUNTER — Ambulatory Visit (INDEPENDENT_AMBULATORY_CARE_PROVIDER_SITE_OTHER): Payer: BC Managed Care – PPO | Admitting: Family Medicine

## 2013-04-11 VITALS — BP 118/82 | HR 76 | Temp 98.9°F | Wt 236.5 lb

## 2013-04-11 DIAGNOSIS — E785 Hyperlipidemia, unspecified: Secondary | ICD-10-CM

## 2013-04-11 DIAGNOSIS — F449 Dissociative and conversion disorder, unspecified: Secondary | ICD-10-CM

## 2013-04-11 DIAGNOSIS — R09A2 Foreign body sensation, throat: Secondary | ICD-10-CM

## 2013-04-11 DIAGNOSIS — Z23 Encounter for immunization: Secondary | ICD-10-CM

## 2013-04-11 DIAGNOSIS — F458 Other somatoform disorders: Secondary | ICD-10-CM

## 2013-04-11 HISTORY — DX: Foreign body sensation, throat: R09.A2

## 2013-04-11 MED ORDER — FENOFIBRATE 54 MG PO TABS: 54.0000 mg | ORAL_TABLET | Freq: Every day | ORAL | Status: DC

## 2013-04-11 MED ORDER — ATORVASTATIN CALCIUM 20 MG PO TABS: 20.0000 mg | ORAL_TABLET | Freq: Every day | ORAL | Status: DC

## 2013-04-11 NOTE — Patient Instructions (Addendum)
Let's decrease lipitor to 20mg  daily. Continue fish oil 2 grams daily. Start tricor at 54 mg daily. Return in 2-3 months for lab visit for fasting blood work again Flu shot today. Trial of nexium 40mg  daily for next week - if this helps with night time symptoms, may start omeprazole over the counter 20mg  daily or as needed. Sleep questionairre provided today.

## 2013-04-11 NOTE — Assessment & Plan Note (Signed)
Increased lipitor had opposite effect from expected on HDL and trig. Decrease back to 20mg  daily (at this dose good LDL effect) and continue fish oil Retrial of tricor at lower dose - monitor LFTs in 2 months. Pt agrees with plan. Consider lovaza vs niacin in future.

## 2013-04-11 NOTE — Addendum Note (Signed)
Addended by: Josph Macho A on: 04/11/2013 09:02 AM   Modules accepted: Orders

## 2013-04-11 NOTE — Assessment & Plan Note (Addendum)
Anticipate hidden GERD in h/o GERD. Trial of 40mg  nexium (samples provided) if this helps, start OTC PPI. Alternatively, ?OSA.  ESS today = 8.  If not relieved with PPI, consider sleep study. Doubt allergy related -continue zyrtec nightly.

## 2013-04-11 NOTE — Progress Notes (Signed)
  Subjective:    Patient ID: Erik Cole, male    DOB: 21-Feb-1971, 42 y.o.   MRN: [id number]  HPI CC: 3 mo f/u  Pleasant 42 yo who presents today for 3 mo f/u  Hypertriglyceridemia - compliant with lipitor 40mg  daily and fish oil 2 capsules daily.  Recent increase to 40mg  daily of lipitor over last 3 months.  Trig elevated to 300s. tricor in past caused transaminitis.  Takes zyrtec nightly for allergies. At night time when laying flat on back, feels something lodged in back of throat. Denies gerd sxs - h/o this, resolved with backing off sodas.  Has used protonix in past. Restful sleep.  Some daytime somnolence.  Easily falls asleep when watching tv or reading book. Snores.  No PNdyspnea or cough.  Better with sitting up or sleeping on side. Some PNDrainage.  Wt Readings from Last 3 Encounters:  04/11/13 236 lb 8 oz (107.276 kg)  01/04/13 236 lb 4 oz (107.162 kg)  12/31/11 233 lb 12 oz (106.028 kg)    Past Medical History  Diagnosis Date  . HLD (hyperlipidemia)     previous PCP stopped tricor 2/2 elevated LFTs 07/2010  . GERD (gastroesophageal reflux disease)   . History of chicken pox   . Transaminitis     hx mild, w/u ~2008 neg HbsAg, Hep C, ANA, nl iron/ferritin, ceruloplasmin, a1-antitrypsin, normal [place]  . Anxiety     mild, xanax sparingly  . Obesity      Review of Systems Per HPI    Objective:   Physical Exam  Nursing note and vitals reviewed. Constitutional: He appears well-developed and well-nourished. No distress.  HENT:  Mouth/Throat: Oropharynx is clear and moist. No oropharyngeal exudate.  Neck: Normal range of motion. Neck supple. No tracheal deviation present. No thyromegaly present.  Lymphadenopathy:    He has no cervical adenopathy.       Assessment & Plan:

## 2013-05-12 ENCOUNTER — Other Ambulatory Visit: Payer: Self-pay | Admitting: Family Medicine

## 2013-05-12 NOTE — Telephone Encounter (Signed)
plz phone in. 

## 2013-05-12 NOTE — Telephone Encounter (Signed)
Ok to refill 

## 2013-05-12 NOTE — Telephone Encounter (Signed)
Rx called in as directed.   

## 2013-06-11 ENCOUNTER — Other Ambulatory Visit: Payer: Self-pay | Admitting: Family Medicine

## 2013-06-11 DIAGNOSIS — E785 Hyperlipidemia, unspecified: Secondary | ICD-10-CM

## 2013-06-13 ENCOUNTER — Other Ambulatory Visit: Payer: BC Managed Care – PPO

## 2013-06-14 ENCOUNTER — Other Ambulatory Visit (INDEPENDENT_AMBULATORY_CARE_PROVIDER_SITE_OTHER): Payer: BC Managed Care – PPO

## 2013-06-14 DIAGNOSIS — E785 Hyperlipidemia, unspecified: Secondary | ICD-10-CM

## 2013-06-14 DIAGNOSIS — E669 Obesity, unspecified: Secondary | ICD-10-CM

## 2013-06-14 LAB — LIPID PANEL: Triglycerides: 330 mg/dL — ABNORMAL HIGH (ref 0.0–149.0)

## 2013-06-14 LAB — HEPATIC FUNCTION PANEL
ALT: 36 U/L (ref 0–53)
AST: 25 U/L (ref 0–37)
Alkaline Phosphatase: 63 U/L (ref 39–117)
Bilirubin, Direct: 0.1 mg/dL (ref 0.0–0.3)
Total Bilirubin: 0.9 mg/dL (ref 0.3–1.2)

## 2013-06-14 LAB — LDL CHOLESTEROL, DIRECT: Direct LDL: 97.1 mg/dL

## 2013-06-15 ENCOUNTER — Other Ambulatory Visit: Payer: Self-pay | Admitting: Family Medicine

## 2013-06-15 DIAGNOSIS — E785 Hyperlipidemia, unspecified: Secondary | ICD-10-CM

## 2013-06-15 MED ORDER — FENOFIBRATE 120 MG PO TABS: 120.0000 mg | ORAL_TABLET | Freq: Every day | ORAL | Status: DC

## 2013-06-16 ENCOUNTER — Telehealth: Payer: Self-pay | Admitting: *Deleted

## 2013-06-16 MED ORDER — FENOFIBRATE MICRONIZED 130 MG PO CAPS: 130.0000 mg | ORAL_CAPSULE | Freq: Every day | ORAL | Status: DC

## 2013-06-16 NOTE — Telephone Encounter (Signed)
[facility] sent fax requesting to change fenofibrate 120 mg to fenofibrate 130 mg. Apparently fenofibrate 120 is considered Fenoglide and is $260/mo and the fenofibrate 130 mg is generic and much cheaper for patient.

## 2013-06-16 NOTE — Telephone Encounter (Signed)
Ok to change - sent new script in.

## 2013-09-08 ENCOUNTER — Other Ambulatory Visit: Payer: BC Managed Care – PPO

## 2013-09-14 ENCOUNTER — Other Ambulatory Visit (INDEPENDENT_AMBULATORY_CARE_PROVIDER_SITE_OTHER): Payer: BC Managed Care – PPO

## 2013-09-14 DIAGNOSIS — E785 Hyperlipidemia, unspecified: Secondary | ICD-10-CM

## 2013-09-14 LAB — CK: Total CK: 72 U/L (ref 7–232)

## 2013-09-14 LAB — COMPREHENSIVE METABOLIC PANEL
ALK PHOS: 53 U/L (ref 39–117)
ALT: 64 U/L — AB (ref 0–53)
AST: 35 U/L (ref 0–37)
Albumin: 4.2 g/dL (ref 3.5–5.2)
BUN: 15 mg/dL (ref 6–23)
CALCIUM: 9.6 mg/dL (ref 8.4–10.5)
CO2: 29 mEq/L (ref 19–32)
Chloride: 104 mEq/L (ref 96–112)
Creatinine, Ser: 1.1 mg/dL (ref 0.4–1.5)
GFR: 80.46 mL/min (ref >60.00)
Glucose, Bld: 86 mg/dL (ref 70–99)
Potassium: 4.1 mEq/L (ref 3.5–5.1)
SODIUM: 139 meq/L (ref 135–145)
TOTAL PROTEIN: 7.3 g/dL (ref 6.0–8.3)
Total Bilirubin: 0.7 mg/dL (ref 0.3–1.2)

## 2013-09-14 LAB — LIPID PANEL
CHOLESTEROL: 166 mg/dL (ref 0–200)
HDL: 35.6 mg/dL — ABNORMAL LOW (ref >39.00)
LDL Cholesterol: 92 mg/dL (ref 0–99)
TRIGLYCERIDES: 190 mg/dL — AB (ref 0.0–149.0)
Total CHOL/HDL Ratio: 5
VLDL: 38 mg/dL (ref 0.0–40.0)

## 2013-09-15 ENCOUNTER — Other Ambulatory Visit: Payer: Self-pay | Admitting: Family Medicine

## 2013-09-15 NOTE — Telephone Encounter (Signed)
Rx called in as directed.   

## 2013-09-15 NOTE — Telephone Encounter (Signed)
plz phone in. 

## 2013-09-16 ENCOUNTER — Other Ambulatory Visit: Payer: Self-pay | Admitting: Family Medicine

## 2013-09-16 ENCOUNTER — Encounter: Payer: Self-pay | Admitting: Family Medicine

## 2013-09-16 DIAGNOSIS — R7401 Elevation of levels of liver transaminase levels: Secondary | ICD-10-CM | POA: Insufficient documentation

## 2013-09-16 DIAGNOSIS — R740 Nonspecific elevation of levels of transaminase and lactic acid dehydrogenase [LDH]: Secondary | ICD-10-CM

## 2013-09-16 DIAGNOSIS — E785 Hyperlipidemia, unspecified: Secondary | ICD-10-CM

## 2013-09-16 MED ORDER — ATORVASTATIN CALCIUM 10 MG PO TABS: 10.0000 mg | ORAL_TABLET | Freq: Every day | ORAL | Status: DC

## 2013-09-27 ENCOUNTER — Encounter: Payer: Self-pay | Admitting: *Deleted

## 2014-01-31 ENCOUNTER — Other Ambulatory Visit: Payer: Self-pay | Admitting: Family Medicine

## 2014-02-14 ENCOUNTER — Other Ambulatory Visit: Payer: Self-pay | Admitting: Family Medicine

## 2014-02-14 NOTE — Telephone Encounter (Signed)
Ok to refill in Dr. Synthia Innocent absence? Last filled 09/15/13 #30 0RF.

## 2014-02-14 NOTE — Telephone Encounter (Signed)
Please call in.  Thanks.   

## 2014-02-14 NOTE — Telephone Encounter (Signed)
Rx called in as directed.   

## 2014-03-20 ENCOUNTER — Other Ambulatory Visit: Payer: Self-pay | Admitting: Family Medicine

## 2014-03-23 ENCOUNTER — Other Ambulatory Visit: Payer: BC Managed Care – PPO

## 2014-03-30 ENCOUNTER — Other Ambulatory Visit (INDEPENDENT_AMBULATORY_CARE_PROVIDER_SITE_OTHER): Payer: BC Managed Care – PPO

## 2014-03-30 DIAGNOSIS — R7402 Elevation of levels of lactic acid dehydrogenase (LDH): Secondary | ICD-10-CM

## 2014-03-30 DIAGNOSIS — R7401 Elevation of levels of liver transaminase levels: Secondary | ICD-10-CM

## 2014-03-30 DIAGNOSIS — R740 Nonspecific elevation of levels of transaminase and lactic acid dehydrogenase [LDH]: Secondary | ICD-10-CM

## 2014-03-30 DIAGNOSIS — E785 Hyperlipidemia, unspecified: Secondary | ICD-10-CM

## 2014-03-30 LAB — HEPATIC FUNCTION PANEL
ALT: 33 U/L (ref 0–53)
AST: 22 U/L (ref 0–37)
Albumin: 4.4 g/dL (ref 3.5–5.2)
Alkaline Phosphatase: 59 U/L (ref 39–117)
Bilirubin, Direct: 0 mg/dL (ref 0.0–0.3)
TOTAL PROTEIN: 7.7 g/dL (ref 6.0–8.3)
Total Bilirubin: 0.5 mg/dL (ref 0.2–1.2)

## 2014-03-30 LAB — LDL CHOLESTEROL, DIRECT: Direct LDL: 101.4 mg/dL

## 2014-03-30 LAB — LIPID PANEL
CHOL/HDL RATIO: 6
Cholesterol: 161 mg/dL (ref 0–200)
HDL: 28 mg/dL — ABNORMAL LOW (ref >39.00)
NonHDL: 133
TRIGLYCERIDES: 269 mg/dL — AB (ref 0.0–149.0)
VLDL: 53.8 mg/dL — AB (ref 0.0–40.0)

## 2014-06-13 ENCOUNTER — Other Ambulatory Visit: Payer: Self-pay | Admitting: Family Medicine

## 2014-06-14 NOTE — Telephone Encounter (Signed)
Rx called in as directed.   

## 2014-06-14 NOTE — Telephone Encounter (Signed)
Ok to refill 

## 2014-06-14 NOTE — Telephone Encounter (Signed)
plz phone in. 

## 2014-07-20 ENCOUNTER — Encounter: Payer: Self-pay | Admitting: Family Medicine

## 2014-07-20 ENCOUNTER — Ambulatory Visit (INDEPENDENT_AMBULATORY_CARE_PROVIDER_SITE_OTHER): Payer: BLUE CROSS/BLUE SHIELD | Admitting: Family Medicine

## 2014-07-20 VITALS — BP 128/94 | HR 88 | Temp 98.3°F | Ht 71.0 in | Wt 240.5 lb

## 2014-07-20 DIAGNOSIS — E669 Obesity, unspecified: Secondary | ICD-10-CM

## 2014-07-20 DIAGNOSIS — Z0000 Encounter for general adult medical examination without abnormal findings: Secondary | ICD-10-CM

## 2014-07-20 DIAGNOSIS — E785 Hyperlipidemia, unspecified: Secondary | ICD-10-CM

## 2014-07-20 DIAGNOSIS — E041 Nontoxic single thyroid nodule: Secondary | ICD-10-CM

## 2014-07-20 DIAGNOSIS — Z23 Encounter for immunization: Secondary | ICD-10-CM

## 2014-07-20 DIAGNOSIS — F419 Anxiety disorder, unspecified: Secondary | ICD-10-CM

## 2014-07-20 DIAGNOSIS — E042 Nontoxic multinodular goiter: Secondary | ICD-10-CM | POA: Insufficient documentation

## 2014-07-20 MED ORDER — ATORVASTATIN CALCIUM 10 MG PO TABS: 10.0000 mg | ORAL_TABLET | Freq: Every day | ORAL | Status: DC

## 2014-07-20 MED ORDER — OMEPRAZOLE 20 MG PO CPDR: 20.0000 mg | DELAYED_RELEASE_CAPSULE | Freq: Every day | ORAL | Status: DC | PRN

## 2014-07-20 MED ORDER — FENOFIBRATE MICRONIZED 130 MG PO CAPS: 130.0000 mg | ORAL_CAPSULE | Freq: Every day | ORAL | Status: DC

## 2014-07-20 NOTE — Assessment & Plan Note (Signed)
Check thyroid US Lab Results  Component Value Date   TSH 1.22 04/02/2011

## 2014-07-20 NOTE — Assessment & Plan Note (Signed)
Body mass index is 33.56 kg/(m^2).  Encouraged healthy diet and lifestyle changes.

## 2014-07-20 NOTE — Progress Notes (Signed)
Pre visit review using our clinic review tool, if applicable. No additional management support is needed unless otherwise documented below in the visit note. 

## 2014-07-20 NOTE — Progress Notes (Signed)
BP 128/94 mmHg  Pulse 88  Temp(Src) 98.3 F (36.8 C) (Oral)  Ht 5\' 11"  (1.803 m)  Wt 240 lb 8 oz (109.09 kg)  BMI 33.56 kg/m2   CC: CPE  Subjective:    Patient ID: Erik Cole, male    DOB: 1970-08-03, 44 y.o.   MRN: [id number]  HPI: Erik Cole is a 44 y.o. male presenting on 07/20/2014 for Annual Exam   Anxiety - takes alprazolam rarely. Refilled Q6 mo. Feels fatigued and somnolent with 0.5mg , will try 1/2 tab.  Preventative: Tetanus done 10/2009 Flu shot - yearly  Seat belt 100%  Sunscreen use discussed.   Caffeine: occasionally.  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  Diet: good water, daily fruits/vegetables, red meat rarely, fish 1x/wk, cut out fast food   Relevant past medical, surgical, family and social history reviewed and updated as indicated. Interim medical history since our last visit reviewed. Allergies and medications reviewed and updated. Current Outpatient Prescriptions on File Prior to Visit  Medication Sig  . ALPRAZolam (XANAX) 0.5 MG tablet TAKE ONE (1) TABLET BY MOUTH DAILY AS NEEDED  . cetirizine (ZYRTEC) 10 MG tablet Take 10 mg by mouth daily.   No current facility-administered medications on file prior to visit.    Review of Systems  Constitutional: Negative for fever, chills, activity change, appetite change, fatigue and unexpected weight change.  HENT: Negative for hearing loss.   Eyes: Negative for visual disturbance.  Respiratory: Negative for cough, chest tightness, shortness of breath and wheezing.   Cardiovascular: Negative for chest pain, palpitations and leg swelling.  Gastrointestinal: Negative for nausea, vomiting, abdominal pain, diarrhea, constipation, blood in stool and abdominal distention.  Genitourinary: Negative for hematuria and difficulty urinating.  Musculoskeletal: Negative for myalgias,  arthralgias and neck pain.  Skin: Negative for rash.  Neurological: Negative for dizziness, seizures, syncope and headaches.  Hematological: Negative for adenopathy. Does not bruise/bleed easily.  Psychiatric/Behavioral: Negative for dysphoric mood. The patient is nervous/anxious (work related stress).    Per HPI unless specifically indicated above     Objective:    BP 128/94 mmHg  Pulse 88  Temp(Src) 98.3 F (36.8 C) (Oral)  Ht 5\' 11"  (1.803 m)  Wt 240 lb 8 oz (109.09 kg)  BMI 33.56 kg/m2  Wt Readings from Last 3 Encounters:  07/20/14 240 lb 8 oz (109.09 kg)  04/11/13 236 lb 8 oz (107.276 kg)  01/04/13 236 lb 4 oz (107.162 kg)    Physical Exam  Constitutional: He is oriented to person, place, and time. He appears well-developed and well-nourished. No distress.  HENT:  Head: Normocephalic and atraumatic.  Right Ear: Hearing, tympanic membrane, external ear and ear canal normal.  Left Ear: Hearing, tympanic membrane, external ear and ear canal normal.  Nose: Nose normal.  Mouth/Throat: Uvula is midline, oropharynx is clear and moist and mucous membranes are normal. No oropharyngeal exudate, posterior oropharyngeal edema or posterior oropharyngeal erythema.  Eyes: Conjunctivae and EOM are normal. Pupils are equal, round, and reactive to light. No scleral icterus.  Neck: Normal range of motion. Neck supple. No thyromegaly (?small R thyroid nodule) present.  Cardiovascular: Normal rate, regular rhythm, normal heart sounds and intact distal pulses.   No murmur heard. Pulses:      Radial pulses are 2+ on the right side, and 2+ on the left side.  Pulmonary/Chest: Effort normal and breath sounds normal. No  respiratory distress. He has no wheezes. He has no rales.  Abdominal: Soft. Bowel sounds are normal. He exhibits no distension and no mass. There is no tenderness. There is no rebound and no guarding.  Musculoskeletal: Normal range of motion. He exhibits no edema.  Lymphadenopathy:      He has no cervical adenopathy.  Neurological: He is alert and oriented to person, place, and time.  CN grossly intact, station and gait intact  Skin: Skin is warm and dry. No rash noted.  Psychiatric: He has a normal mood and affect. His behavior is normal. Judgment and thought content normal.  Nursing note and vitals reviewed.  Results for orders placed or performed in visit on 03/30/14  Lipid panel  Result Value Ref Range   Cholesterol 161 0 - 200 mg/dL   Triglycerides 269.0 (H) 0.0 - 149.0 mg/dL   HDL 28.00 (L) >39.00 mg/dL   VLDL 53.8 (H) 0.0 - 40.0 mg/dL   Total CHOL/HDL Ratio 6    NonHDL 133.00   Hepatic Function Panel  Result Value Ref Range   Total Bilirubin 0.5 0.2 - 1.2 mg/dL   Bilirubin, Direct 0.0 0.0 - 0.3 mg/dL   Alkaline Phosphatase 59 39 - 117 U/L   AST 22 0 - 37 U/L   ALT 33 0 - 53 U/L   Total Protein 7.7 6.0 - 8.3 g/dL   Albumin 4.4 3.5 - 5.2 g/dL  LDL cholesterol, direct  Result Value Ref Range   Direct LDL 101.4 mg/dL      Assessment & Plan:   Problem List Items Addressed This Visit    Right thyroid nodule    Check thyroid US Lab Results  Component Value Date   TSH 1.22 04/02/2011      Relevant Orders      US Soft Tissue Head/Neck   Obesity    Body mass index is 33.56 kg/(m^2).  Encouraged healthy diet and lifestyle changes.    Healthcare maintenance - Primary    Preventative protocols reviewed and updated unless pt declined. Discussed healthy diet and lifestyle.    Dyslipidemia    Continue lipitor/fenofibrate. Return fasting for labs.    Relevant Medications      atorvastatin (LIPITOR) tablet      fenofibrate micronized (ANTARA) capsule   Anxiety, mild    Minimal use. Did not update controlled substance agreement.        Follow up plan: No Follow-up on file.

## 2014-07-20 NOTE — Addendum Note (Signed)
Addended by: Royann Shivers A on: 07/20/2014 11:43 AM   Modules accepted: Orders

## 2014-07-20 NOTE — Assessment & Plan Note (Signed)
Preventative protocols reviewed and updated unless pt declined. Discussed healthy diet and lifestyle.  

## 2014-07-20 NOTE — Assessment & Plan Note (Signed)
Continue lipitor/fenofibrate. Return fasting for labs.

## 2014-07-20 NOTE — Patient Instructions (Addendum)
Flu shot today. Return at your convenience for fasting labwork. Look into mediterranean diet. Work on incorporating activity into routine. Return as needed or in 1 year for next physical.

## 2014-07-20 NOTE — Assessment & Plan Note (Signed)
Minimal use. Did not update controlled substance agreement.

## 2014-07-23 ENCOUNTER — Ambulatory Visit (HOSPITAL_BASED_OUTPATIENT_CLINIC_OR_DEPARTMENT_OTHER)
Admission: RE | Admit: 2014-07-23 | Discharge: 2014-07-23 | Disposition: A | Discharge status: Home / Self Care | Payer: BLUE CROSS/BLUE SHIELD | Source: Ambulatory Visit | Attending: Family Medicine | Admitting: Family Medicine

## 2014-07-23 DIAGNOSIS — E042 Nontoxic multinodular goiter: Secondary | ICD-10-CM | POA: Insufficient documentation

## 2014-07-23 DIAGNOSIS — R221 Localized swelling, mass and lump, neck: Secondary | ICD-10-CM | POA: Diagnosis present

## 2014-07-23 DIAGNOSIS — E041 Nontoxic single thyroid nodule: Secondary | ICD-10-CM

## 2014-07-23 IMAGING — US US SOFT TISSUE HEAD/NECK
1 series · 13 of 25 positions shown · non-contrast
Comparison: None.

CLINICAL DATA: Lump felt on right side of the neck.

EXAM:
THYROID ULTRASOUND
TECHNIQUE: Ultrasound examination of the thyroid gland and adjacent soft
tissues was performed.

[Series 1: us soft tissue head/neck · 0.06mm/px · 13 of 28 slices shown]
[im 1/28]
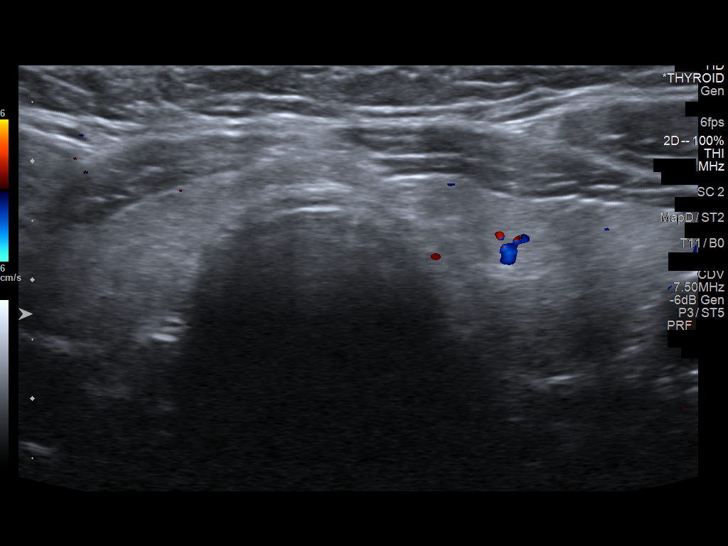
[im 3/28]
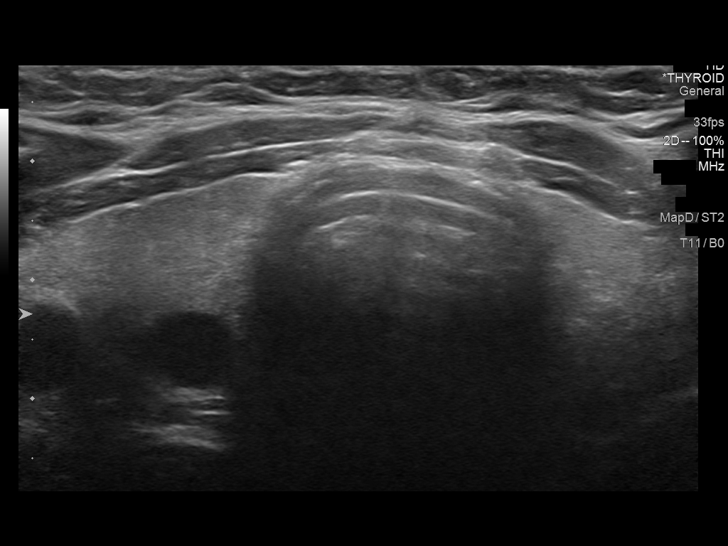
[im 5/28]
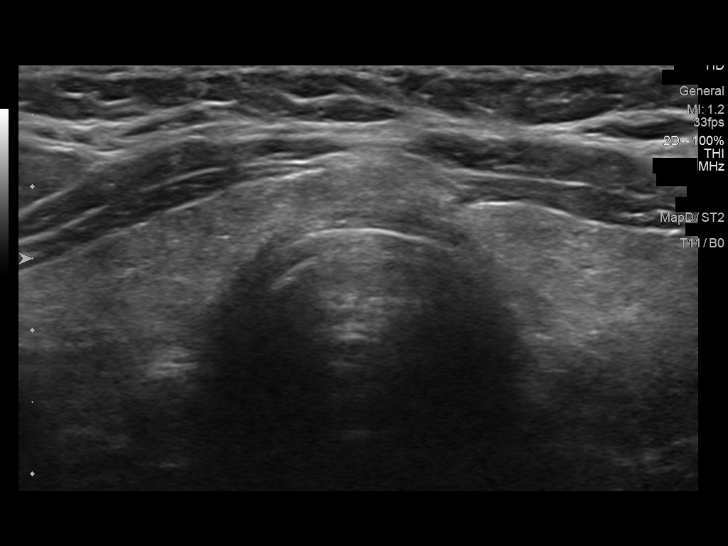
[im 7/28]
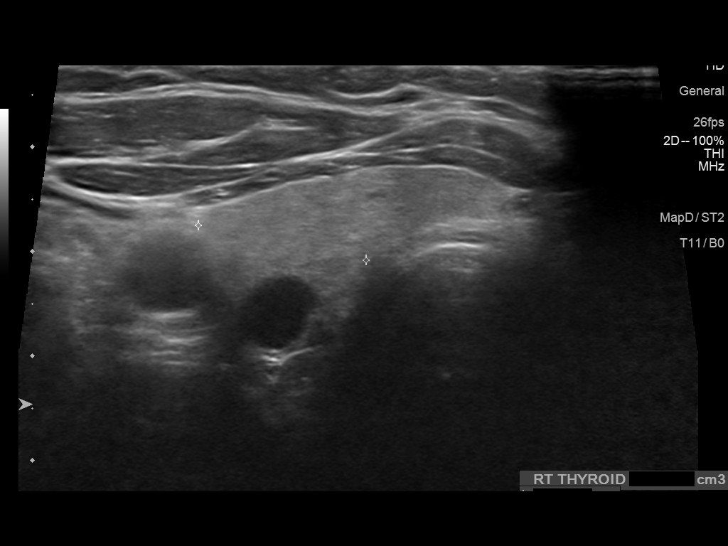
[im 10/28]
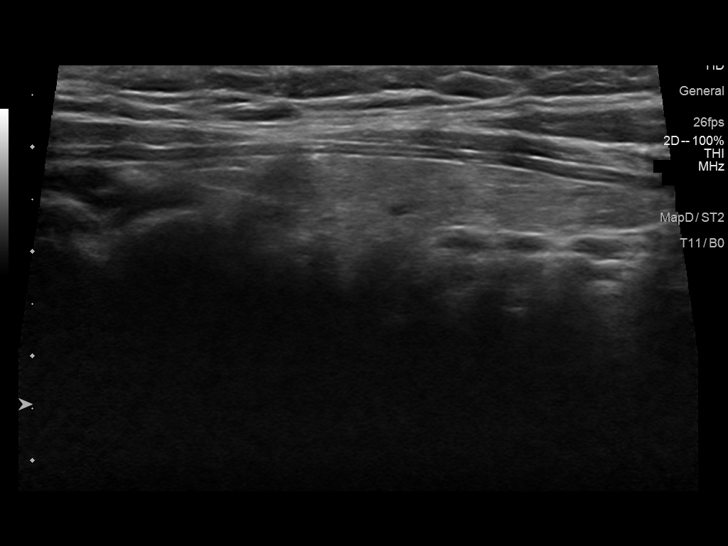
[im 12/28]
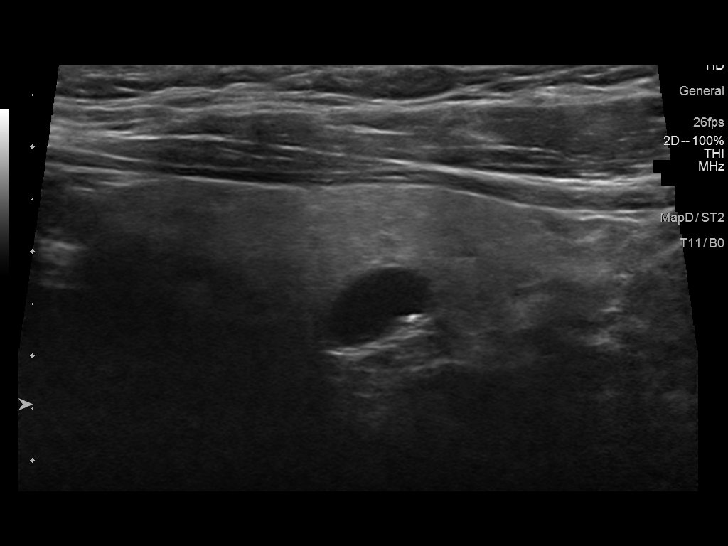
[im 14/28]
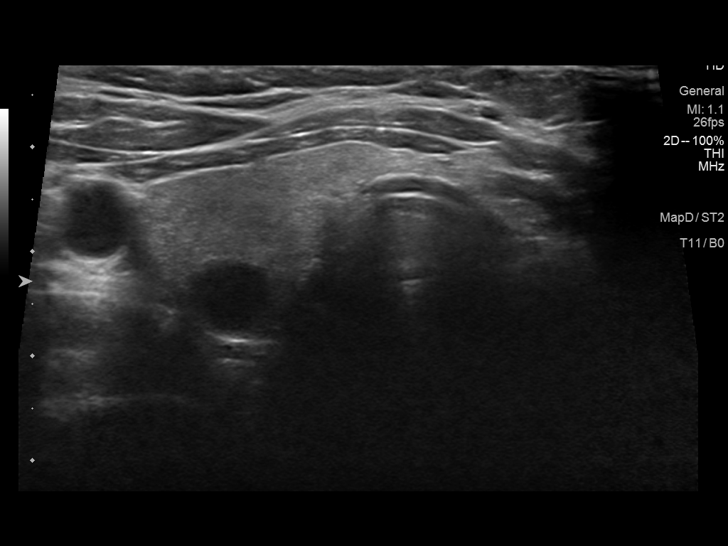
[im 16/28]
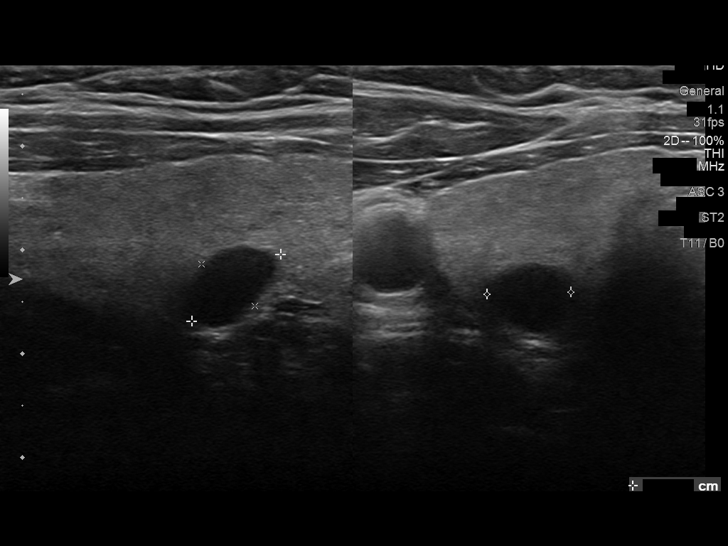
[im 19/28]
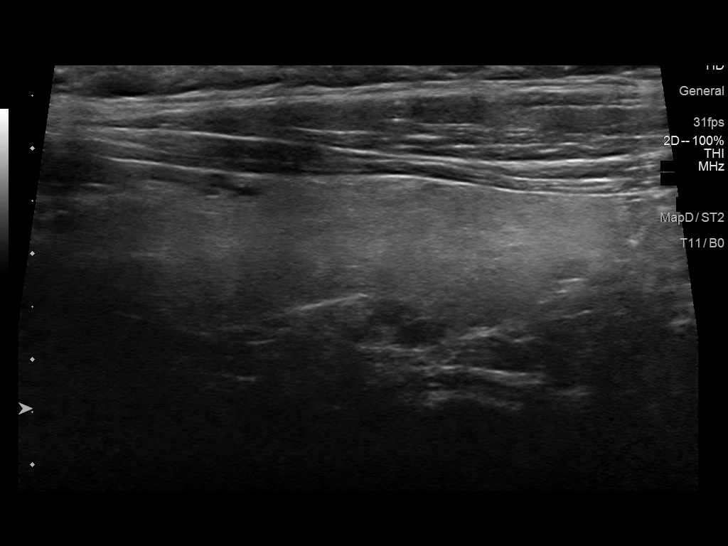
[im 21/28]
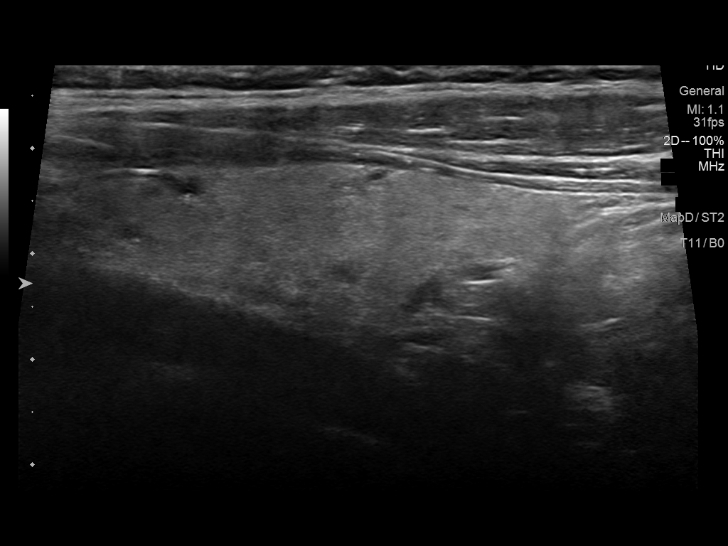
[im 23/28]
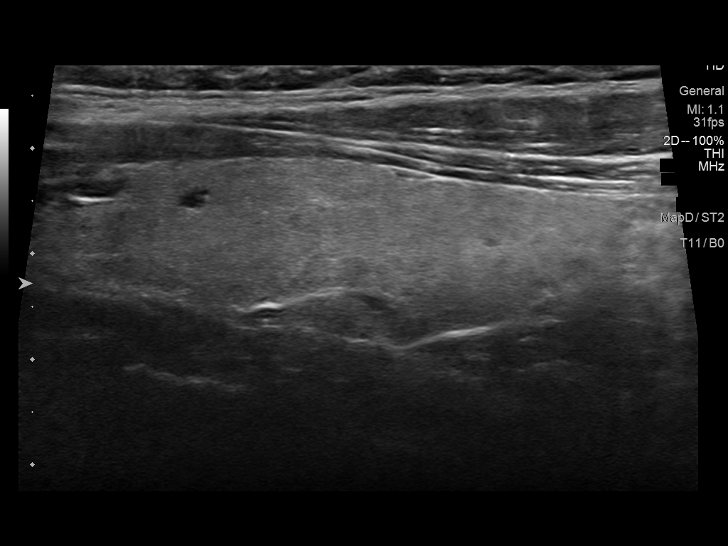
[im 25/28]
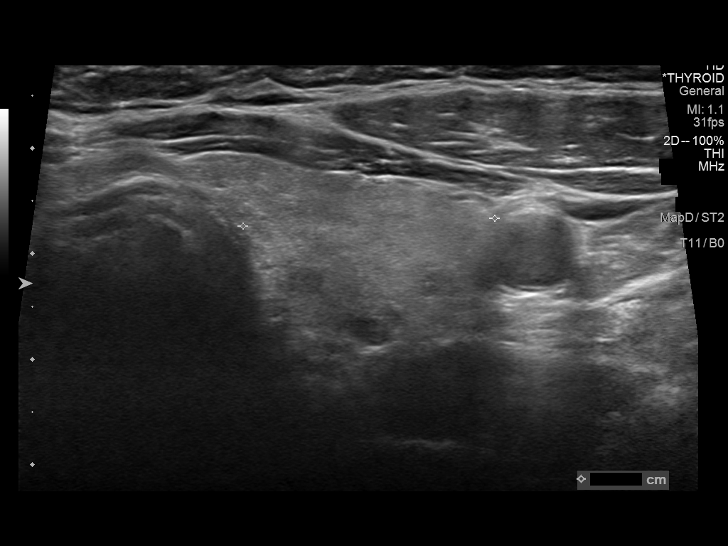
[im 28/28]
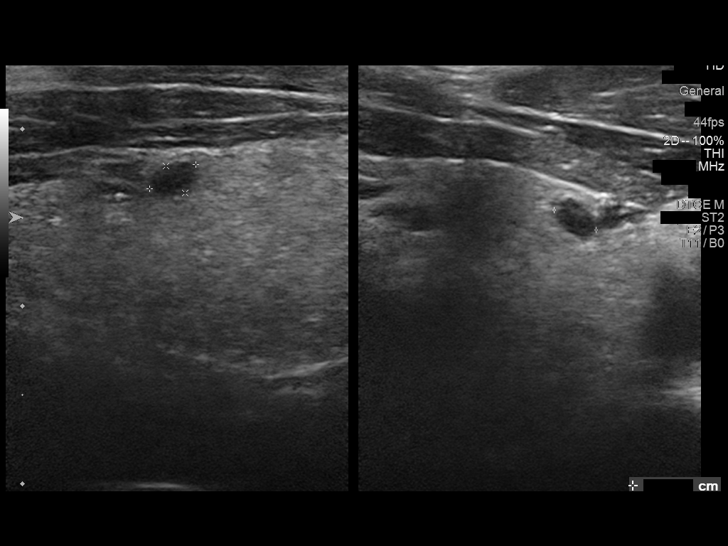

[13 of 25 positions shown; findings below may reference images not displayed]

FINDINGS: Right thyroid lobe

Measurements: Normal in size measuring 4.9 x 1.8 x 1.6 cm.

Right, mid, posterior - 1.1 x 0.7 x 0.8 cm - anechoic, cystic

Left thyroid lobe

Measurements: Borderline enlarged measuring 5.2 x 1.5 x 2.3 cm.

Left, mid, posterior, exophytic - 0.3 x 0.6 x 0.6 cm - anechoic,
likely cystic.

Left, mid, lateral - 0.3 x 0.5 x 0.4 cm - hypoechoic, likely solid

Left, mid, anterior - 0.4 x 0.2 x 0.3 cm - anechoic with peripheral
echogenic nodule with ring down artifact suggestive of colloid.

Left, mid, anterior, lateral - 0.3 x 0.2 x 0.3 cm - anechoic, likely
cystic

Isthmus

Thickness: Normal in size measures 0.3 cm in diameter..

No discrete nodules identified within the thyroid isthmus.

Lymphadenopathy

None visualized.
IMPRESSION: Findings compatible with multi nodular goiter. None of the
discretely measured thyroid nodules, including the dominant
approximately 1.1 cm cystic nodule within the right lobe of the
thyroid, currently meet imaging criteria to recommend percutaneous
sampling. This recommendation follows the consensus statement:
Management of Thyroid Nodules Detected at US: Society of
Radiologists in Ultrasound Consensus Conference Statement. Radiology

## 2014-07-24 ENCOUNTER — Other Ambulatory Visit (INDEPENDENT_AMBULATORY_CARE_PROVIDER_SITE_OTHER): Payer: BLUE CROSS/BLUE SHIELD

## 2014-07-24 DIAGNOSIS — E785 Hyperlipidemia, unspecified: Secondary | ICD-10-CM

## 2014-07-24 DIAGNOSIS — E041 Nontoxic single thyroid nodule: Secondary | ICD-10-CM

## 2014-07-24 LAB — COMPREHENSIVE METABOLIC PANEL
ALK PHOS: 54 U/L (ref 39–117)
ALT: 36 U/L (ref 0–53)
AST: 24 U/L (ref 0–37)
Albumin: 4.8 g/dL (ref 3.5–5.2)
BUN: 17 mg/dL (ref 6–23)
CALCIUM: 9.6 mg/dL (ref 8.4–10.5)
CO2: 27 mEq/L (ref 19–32)
Chloride: 105 mEq/L (ref 96–112)
Creatinine, Ser: 1.1 mg/dL (ref 0.4–1.5)
GFR: 79.28 mL/min (ref >60.00)
Glucose, Bld: 99 mg/dL (ref 70–99)
POTASSIUM: 4.1 meq/L (ref 3.5–5.1)
SODIUM: 138 meq/L (ref 135–145)
TOTAL PROTEIN: 8 g/dL (ref 6.0–8.3)
Total Bilirubin: 0.8 mg/dL (ref 0.2–1.2)

## 2014-07-24 LAB — LIPID PANEL
Cholesterol: 197 mg/dL (ref 0–200)
HDL: 31.8 mg/dL — ABNORMAL LOW (ref >39.00)
NONHDL: 165.2
TRIGLYCERIDES: 258 mg/dL — AB (ref 0.0–149.0)
Total CHOL/HDL Ratio: 6
VLDL: 51.6 mg/dL — ABNORMAL HIGH (ref 0.0–40.0)

## 2014-07-24 LAB — TSH: TSH: 1.67 u[IU]/mL (ref 0.35–4.50)

## 2014-07-24 LAB — T4, FREE: FREE T4: 0.82 ng/dL (ref 0.60–1.60)

## 2014-07-24 LAB — LDL CHOLESTEROL, DIRECT: LDL DIRECT: 121.5 mg/dL

## 2014-07-25 ENCOUNTER — Encounter: Payer: Self-pay | Admitting: Family Medicine

## 2014-09-11 ENCOUNTER — Other Ambulatory Visit: Payer: Self-pay | Admitting: Family Medicine

## 2014-09-11 NOTE — Telephone Encounter (Signed)
Rx called in as directed.   

## 2014-09-11 NOTE — Telephone Encounter (Signed)
plz phone in. 

## 2014-11-09 ENCOUNTER — Encounter: Payer: Self-pay | Admitting: Emergency Medicine

## 2014-11-09 ENCOUNTER — Emergency Department
Admission: EM | Admit: 2014-11-09 | Discharge: 2014-11-09 | Disposition: A | Discharge status: Home / Self Care | Payer: BLUE CROSS/BLUE SHIELD | Source: Home / Self Care | Attending: Family Medicine | Admitting: Family Medicine

## 2014-11-09 DIAGNOSIS — R300 Dysuria: Secondary | ICD-10-CM

## 2014-11-09 LAB — POCT URINALYSIS DIP (MANUAL ENTRY)
BILIRUBIN UA: NEGATIVE
Blood, UA: NEGATIVE
Glucose, UA: NEGATIVE
Ketones, POC UA: NEGATIVE
Leukocytes, UA: NEGATIVE
Nitrite, UA: NEGATIVE
PH UA: 6 (ref 5–8)
PROTEIN UA: NEGATIVE
SPEC GRAV UA: 1.02 (ref 1.005–1.03)
UROBILINOGEN UA: 1 (ref 0–1)

## 2014-11-09 MED ORDER — CIPROFLOXACIN HCL 500 MG PO TABS: 500.0000 mg | ORAL_TABLET | Freq: Two times a day (BID) | ORAL | Status: DC

## 2014-11-09 NOTE — Discharge Instructions (Signed)
Increase fluid intake.   Dysuria Dysuria is the medical term for pain with urination. There are many causes for dysuria, but urinary tract infection is the most common. If a urinalysis was performed it can show that there is a urinary tract infection. A urine culture confirms that you or your child is sick. You will need to follow up with a healthcare provider because:  If a urine culture was done you will need to know the culture results and treatment recommendations.  If the urine culture was positive, you or your child will need to be put on antibiotics or know if the antibiotics prescribed are the right antibiotics for your urinary tract infection.  If the urine culture is negative (no urinary tract infection), then other causes may need to be explored or antibiotics need to be stopped. Today laboratory work may have been done and there does not seem to be an infection. If cultures were done they will take at least 24 to 48 hours to be completed. Today x-rays may have been taken and they read as normal. No cause can be found for the problems. The x-rays may be re-read by a radiologist and you will be contacted if additional findings are made. You or your child may have been put on medications to help with this problem until you can see your primary caregiver. If the problems get better, see your primary caregiver if the problems return. If you were given antibiotics (medications which kill germs), take all of the mediations as directed for the full course of treatment.  If laboratory work was done, you need to find the results. Leave a telephone number where you can be reached. If this is not possible, make sure you find out how you are to get test results. HOME CARE INSTRUCTIONS   Drink lots of fluids. For adults, drink eight, 8 ounce glasses of clear juice or water a day. For children, replace fluids as suggested by your caregiver.  Empty the bladder often. Avoid holding urine for long  periods of time.  After a bowel movement, women should cleanse front to back, using each tissue only once.  Empty your bladder before and after sexual intercourse.  Take all the medicine given to you until it is gone. You may feel better in a few days, but TAKE ALL MEDICINE.  Avoid caffeine, tea, alcohol and carbonated beverages, because they tend to irritate the bladder.  In men, alcohol may irritate the prostate.  Only take over-the-counter or prescription medicines for pain, discomfort, or fever as directed by your caregiver.  If your caregiver has given you a follow-up appointment, it is very important to keep that appointment. Not keeping the appointment could result in a chronic or permanent injury, pain, and disability. If there is any problem keeping the appointment, you must call back to this facility for assistance. SEEK IMMEDIATE MEDICAL CARE IF:   Back pain develops.  A fever develops.  There is nausea (feeling sick to your stomach) or vomiting (throwing up).  Problems are no better with medications or are getting worse. MAKE SURE YOU:   Understand these instructions.  Will watch your condition.  Will get help right away if you are not doing well or get worse. Document Released: 03/27/2004 Document Revised: 09/21/2011 Document Reviewed: 02/02/2008 [facility] Patient Information 2015 [place], [place]. This information is not intended to replace advice given to you by your health care provider. Make sure you discuss any questions you have with your health care  provider.

## 2014-11-09 NOTE — ED Provider Notes (Signed)
CSN: [id number]     Arrival date & time 11/09/14  1902 History   First MD Initiated Contact with Patient 11/09/14 1920     Chief Complaint  Patient presents with  . Dysuria      HPI Comments: Patient complains of two week history of vague intermittent burning sensation in his testicles.  Last night he developed dysuria and mild urgency but no frequency.  No nocturia.  No abdominal or testicular pain.  No urethral discharge.  No fevers, chills, and sweats.  His wife has had no GU symptoms.  Patient is a 44 y.o. male presenting with dysuria. The history is provided by the patient.  Dysuria This is a new problem. The current episode started yesterday. The problem occurs constantly. The problem has not changed since onset.Pertinent negatives include no abdominal pain and no headaches. Nothing aggravates the symptoms. Nothing relieves the symptoms. He has tried nothing for the symptoms.    Past Medical History  Diagnosis Date  . HLD (hyperlipidemia)     previous PCP stopped tricor 2/2 elevated LFTs 07/2010  . GERD (gastroesophageal reflux disease)   . History of chicken pox   . Transaminitis     hx mild, thought fibrate related - w/u ~2008 neg HbsAg, Hep C, ANA, nl iron/ferritin, ceruloplasmin, a1-antitrypsin, normal [place]  . Anxiety     mild, xanax sparingly  . Obesity   . 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 Ganglion cyst excision  2008    R wrist   Family History  Problem Relation Age of Onset  . Hypertension Mother   . Hyperlipidemia Father     TG  . Diabetes Maternal Grandmother   . Coronary artery disease Paternal Grandfather 55    unhealthy  . Stroke Neg Hx   . Breast cancer Other 45    great grandmother, maternal   History  Substance Use Topics  . Smoking status: Never Smoker   . Smokeless tobacco: Never Used  . Alcohol Use: Yes     Comment: Rare    Review of Systems  Constitutional: Negative for fever,  chills, diaphoresis, activity change and fatigue.  Gastrointestinal: Negative for abdominal pain.  Genitourinary: Positive for dysuria and urgency. Negative for frequency, hematuria, flank pain, discharge, penile swelling, scrotal swelling, difficulty urinating, genital sores, penile pain and testicular pain.  Musculoskeletal: Negative.   Skin: Negative.   Neurological: Negative for headaches.  All other systems reviewed and are negative.   Allergies  Tricor  Home Medications   Prior to Admission medications   Medication Sig Start Date End Date Taking? Authorizing Provider  ALPRAZolam Duanne Moron) 0.5 MG tablet TAKE ONE TABLET BY MOUTH DAILY AS NEEDED 09/11/14   Ria Bush, MD  atorvastatin (LIPITOR) 10 MG tablet Take 1 tablet (10 mg total) by mouth daily. 07/20/14   Ria Bush, MD  cetirizine (ZYRTEC) 10 MG tablet Take 10 mg by mouth daily.    Historical Provider, MD  ciprofloxacin (CIPRO) 500 MG tablet Take 1 tablet (500 mg total) by mouth 2 (two) times daily. 11/09/14   Kandra Nicolas, MD  fenofibrate micronized (ANTARA) 130 MG capsule Take 1 capsule (130 mg total) by mouth daily. 07/20/14   Ria Bush, MD  omeprazole (PRILOSEC) 20 MG capsule Take 1 capsule (20 mg total) by mouth daily as needed. 07/20/14   Ria Bush, MD   BP 124/85 mmHg  Pulse 86  Temp(Src) 97.6 F (36.4 C) (Oral)  Ht 5\' 11"  (1.803 m)  Wt 236 lb (107.049 kg)  BMI 32.93 kg/m2  SpO2 98% Physical Exam Nursing notes and Vital Signs reviewed. Appearance:  Patient appears stated age, and in no acute distress Eyes:  Pupils are equal, round, and reactive to light and accomodation.  Extraocular movement is intact.  Conjunctivae are not inflamed  Pharynx:  Normal Neck:  Supple.  No adenopathy Lungs:  Clear to auscultation.  Breath sounds are equal.  Heart:  Regular rate and rhythm without murmurs, rubs, or gallops.  Abdomen:  Nontender without masses or hepatosplenomegaly.  Bowel sounds are present.  No  CVA or flank tenderness.  Extremities:  No edema.   Skin:  No rash present.  Genitourinary:  Penis normal without lesions or urethral discharge.  Scrotum is normal.  Testes are descended bilaterally without nodules or tenderness.  No regional lymphadenopathy palpated   ED Course  Procedures  None  Labs Reviewed -  POCT Urinalysis dipstick:  negative      MDM   1. Dysuria; ?urethritis; ?cystitis    Urine culture pending.  Begin Cipro 500mg  BID Increase fluid intake. Followup with Family Doctor if not improved in 10 days.    Kandra Nicolas, MD 11/09/14 1950

## 2014-11-09 NOTE — ED Notes (Signed)
Dysuria, polyuria started last night

## 2014-11-11 LAB — URINE CULTURE
COLONY COUNT: NO GROWTH
Organism ID, Bacteria: NO GROWTH

## 2014-11-13 ENCOUNTER — Telehealth: Payer: Self-pay | Admitting: Emergency Medicine

## 2014-12-13 ENCOUNTER — Other Ambulatory Visit: Payer: Self-pay | Admitting: Family Medicine

## 2014-12-14 NOTE — Telephone Encounter (Signed)
Rx called in as directed.   

## 2014-12-14 NOTE — Telephone Encounter (Signed)
plz phone in. 

## 2015-02-25 ENCOUNTER — Ambulatory Visit (INDEPENDENT_AMBULATORY_CARE_PROVIDER_SITE_OTHER): Payer: Worker's Compensation | Admitting: Family Medicine

## 2015-02-25 ENCOUNTER — Encounter: Payer: Self-pay | Admitting: Family Medicine

## 2015-02-25 VITALS — BP 133/81 | HR 89 | Ht 72.0 in | Wt 220.0 lb

## 2015-02-25 DIAGNOSIS — S8992XA Unspecified injury of left lower leg, initial encounter: Secondary | ICD-10-CM

## 2015-02-25 NOTE — Patient Instructions (Signed)
You have a knee contusion. There's a small hematoma over this bony prominence on your knee. Ice the area 15 minutes at a time 3-4 times a day. Tylenol, motrin only if it's bothering you. I'd expect another 3 weeks this will go back to looking like the other side. Call me with any questions or concerns.

## 2015-02-27 DIAGNOSIS — S8992XA Unspecified injury of left lower leg, initial encounter: Secondary | ICD-10-CM | POA: Insufficient documentation

## 2015-02-27 NOTE — Assessment & Plan Note (Signed)
consistent with contusion and small hematoma.  Reassured patient.  Icing, tylenol/motrin if needed.  Call with any concerns - expect about 3 more weeks for this to be resolved.

## 2015-02-27 NOTE — Progress Notes (Signed)
PCP: Ria Bush, MD  Subjective:   HPI: Patient is a 44 y.o. male here for left knee injury.  Patient reports on 7/19 he was on a business trip, accidentally turned ankle and fell directly onto left knee. + swelling, knot in this area. Continues to be swollen, hurts to put pressure on it. No pain with walking. Has been icing, elevating. No redness, sweats, chills, fevers. No prior injuries.  Past Medical History  Diagnosis Date  . HLD (hyperlipidemia)     previous PCP stopped tricor 2/2 elevated LFTs 07/2010  . GERD (gastroesophageal reflux disease)   . History of chicken pox   . Transaminitis     hx mild, thought fibrate related - w/u ~2008 neg HbsAg, Hep C, ANA, nl iron/ferritin, ceruloplasmin, a1-antitrypsin, normal [place]  . Anxiety     mild, xanax sparingly  . Obesity   . 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 ALPRAZolam (XANAX) 0.5 MG tablet TAKE ONE (1) TABLET BY MOUTH DAILY AS NEEDED 30 tablet 0  . atorvastatin (LIPITOR) 10 MG tablet Take 1 tablet (10 mg total) by mouth daily. 90 tablet 3  . cetirizine (ZYRTEC) 10 MG tablet Take 10 mg by mouth daily.    . ciprofloxacin (CIPRO) 500 MG tablet Take 1 tablet (500 mg total) by mouth 2 (two) times daily. 20 tablet 0  . fenofibrate micronized (ANTARA) 130 MG capsule Take 1 capsule (130 mg total) by mouth daily. 90 capsule 3  . omeprazole (PRILOSEC) 20 MG capsule Take 1 capsule (20 mg total) by mouth daily as needed. 30 capsule 3   No current facility-administered medications on file prior to visit.    Past Surgical History  Procedure Laterality Date  . Ganglion cyst excision  2008    R wrist    Allergies  Allergen Reactions  . Tricor [Fenofibrate] Other (See Comments)    transaminitis    Social History   Social History  . Marital Status: Married    Spouse Name: N/A  . Number of Children: 3  . 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 Smoking status: Never Smoker   . Smokeless tobacco: Never Used  . Alcohol Use: 0.0 oz/week    0 Standard drinks or equivalent per week     Comment: Rare  . Drug Use: No  . Sexual Activity: Not on file   Other Topics Concern  . Not on file   Social History Narrative   Caffeine: occasionally.   Lives with wife [name]), 2 children (son and daughter), 1 cat   Occupation:    Activity: no regular exercise   Diet: good water, daily fruits/vegetables, red meat rarely, fish 1x/wk, cut out fast food    Family History  Problem Relation Age of Onset  . Hypertension Mother   . Hyperlipidemia Father     TG  . Diabetes Maternal Grandmother   . Coronary artery disease Paternal Grandfather 55    unhealthy  . Stroke Neg Hx   . Breast cancer Other 80    great grandmother, maternal    BP 133/81 mmHg  Pulse 89  Ht 6' (1.829 m)  Wt 220 lb (99.791 kg)  BMI 29.83 kg/m2  Review of Systems: See HPI above.    Objective:  Physical Exam:  Gen: NAD  Left  knee: Mild prominence of tibial tubercle compared to right knee. Minimal TTP tibial tubercle.  No joint line, other tenderness. FROM. Negative ant/post drawers. Negative valgus/varus testing. Negative lachmanns. Negative mcmurrays, apleys, patellar apprehension. NV intact distally.  MSK u/s:  Small amount of swelling consistent with hematoma overlying tibial tubercle left knee - no bony irregularities, edema overlying cortex, neovascularity.    Assessment & Plan:  1. Left knee injury - consistent with contusion and small hematoma.  Reassured patient.  Icing, tylenol/motrin if needed.  Call with any concerns - expect about 3 more weeks for this to be resolved.

## 2015-03-16 ENCOUNTER — Other Ambulatory Visit: Payer: Self-pay | Admitting: Family Medicine

## 2015-03-17 NOTE — Telephone Encounter (Signed)
plz phone in. 

## 2015-03-19 NOTE — Telephone Encounter (Signed)
Rx called in as directed.   

## 2015-04-16 ENCOUNTER — Encounter: Payer: Self-pay | Admitting: Gastroenterology

## 2015-04-17 ENCOUNTER — Encounter: Payer: Self-pay | Admitting: Family Medicine

## 2015-04-17 ENCOUNTER — Ambulatory Visit (INDEPENDENT_AMBULATORY_CARE_PROVIDER_SITE_OTHER): Payer: BLUE CROSS/BLUE SHIELD | Admitting: Family Medicine

## 2015-04-17 ENCOUNTER — Ambulatory Visit (HOSPITAL_BASED_OUTPATIENT_CLINIC_OR_DEPARTMENT_OTHER)
Admission: RE | Admit: 2015-04-17 | Discharge: 2015-04-17 | Disposition: A | Discharge status: Home / Self Care | Payer: BLUE CROSS/BLUE SHIELD | Source: Ambulatory Visit | Attending: Family Medicine | Admitting: Family Medicine

## 2015-04-17 VITALS — BP 130/80 | HR 94 | Temp 98.1°F | Resp 16 | Wt 234.0 lb

## 2015-04-17 DIAGNOSIS — Z8719 Personal history of other diseases of the digestive system: Secondary | ICD-10-CM | POA: Insufficient documentation

## 2015-04-17 DIAGNOSIS — R1030 Lower abdominal pain, unspecified: Secondary | ICD-10-CM

## 2015-04-17 HISTORY — DX: Personal history of other diseases of the digestive system: Z87.19

## 2015-04-17 LAB — CBC WITH DIFFERENTIAL/PLATELET
BASOS PCT: 0.2 % (ref 0.0–3.0)
Basophils Absolute: 0 10*3/uL (ref 0.0–0.1)
EOS PCT: 0.9 % (ref 0.0–5.0)
Eosinophils Absolute: 0.1 10*3/uL (ref 0.0–0.7)
HEMATOCRIT: 44.2 % (ref 39.0–52.0)
HEMOGLOBIN: 14.6 g/dL (ref 13.0–17.0)
LYMPHS PCT: 12.6 % (ref 12.0–46.0)
Lymphs Abs: 1.5 10*3/uL (ref 0.7–4.0)
MCHC: 33 g/dL (ref 30.0–36.0)
MCV: 83.8 fl (ref 78.0–100.0)
Monocytes Absolute: 0.9 10*3/uL (ref 0.1–1.0)
Monocytes Relative: 7.6 % (ref 3.0–12.0)
NEUTROS ABS: 9.5 10*3/uL — AB (ref 1.4–7.7)
Neutrophils Relative %: 78.7 % — ABNORMAL HIGH (ref 43.0–77.0)
Platelets: 306 10*3/uL (ref 150.0–400.0)
RBC: 5.28 Mil/uL (ref 4.22–5.81)
RDW: 13.7 % (ref 11.5–15.5)
WBC: 12.1 10*3/uL — AB (ref 4.0–10.5)

## 2015-04-17 LAB — BASIC METABOLIC PANEL
BUN: 11 mg/dL (ref 6–23)
CALCIUM: 9.6 mg/dL (ref 8.4–10.5)
CO2: 26 mEq/L (ref 19–32)
CREATININE: 0.98 mg/dL (ref 0.40–1.50)
Chloride: 104 mEq/L (ref 96–112)
GFR: 88.38 mL/min (ref >60.00)
GLUCOSE: 126 mg/dL — AB (ref 70–99)
Potassium: 3.8 mEq/L (ref 3.5–5.1)
Sodium: 138 mEq/L (ref 135–145)

## 2015-04-17 IMAGING — CR DG ABDOMEN 1V
2 series · 2 of 2 positions shown · non-contrast
Comparison: None.

CLINICAL DATA: Chronic lower abdominal pain

EXAM:
ABDOMEN - 1 VIEW

[t abdomen supine (1 of 2)]
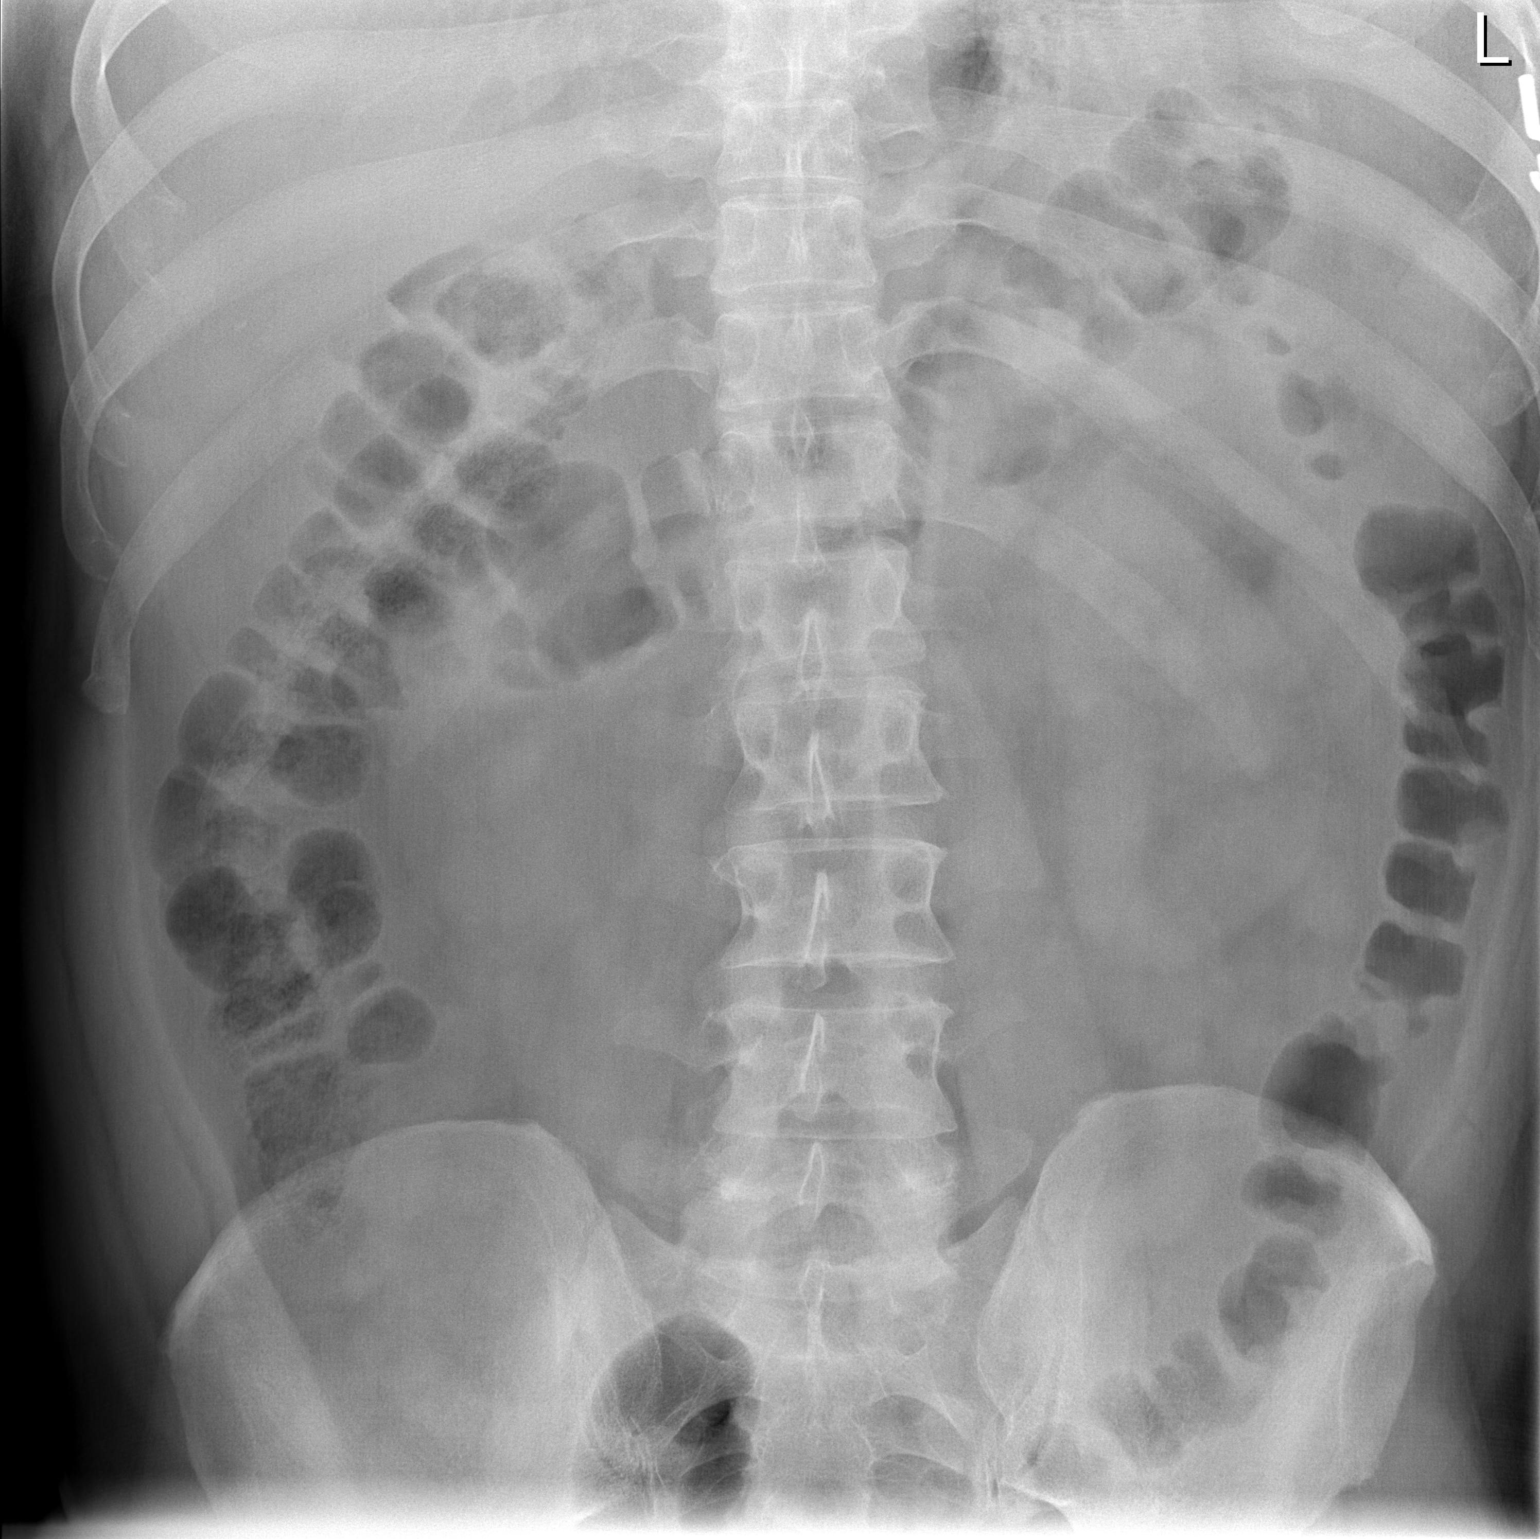

[t abdomen supine (2 of 2)]
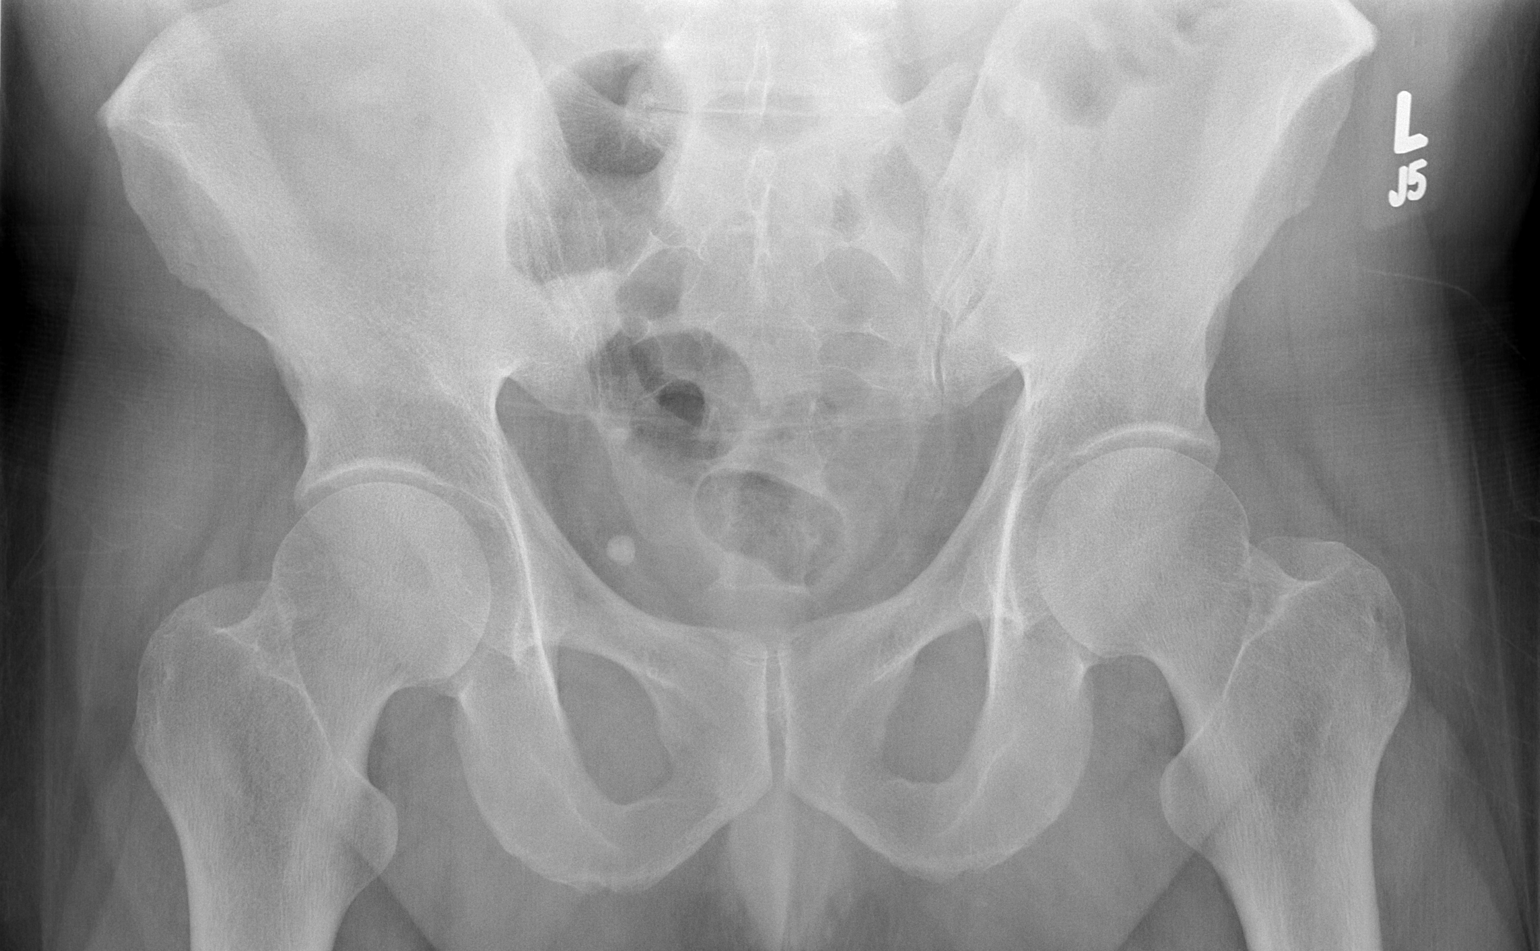

[2 of 2 positions shown; findings below may reference images not displayed]

FINDINGS: Scattered large and small bowel gas is noted. No abnormal mass or
abnormal calcifications are seen. No acute bony abnormality is
noted.
IMPRESSION: No acute abnormality seen.

## 2015-04-17 NOTE — Progress Notes (Signed)
   Subjective:    Patient ID: Erik Cole, male    DOB: Feb 23, 1971, 44 y.o.   MRN: [id number]  HPI abd pain- sxs started Monday.  Pain was constant 'w/ waves of really heavy pain'.  'like i need to go the bathroom but I couldn't'.  Some relief this AM.  Pt was 'incapacitated for a couple days'.  No fevers.  Some mild nausea.  Pt has had similar sxs previously- last occuring 1 year ago.  sxs started suddenly while pt was at work.  Had to leave early.  Pt denies problems w/ constipation.  Took Miralax Monday night, took again Tuesday 'w/ more movement, more relief'.  No changes in diet.  Pt reports pain has nearly resolved today.   Review of Systems For ROS see HPI     Objective:   Physical Exam  Constitutional: He is oriented to person, place, and time. He appears well-developed and well-nourished. No distress.  HENT:  Head: Normocephalic and atraumatic.  Cardiovascular: Normal rate, regular rhythm and normal heart sounds.   Pulmonary/Chest: Effort normal and breath sounds normal. No respiratory distress. He has no wheezes. He has no rales.  Abdominal: Soft. Bowel sounds are normal. He exhibits no distension and no mass. There is tenderness (mild lower abdominal tenderness). There is no rebound and no guarding.  Neurological: He is alert and oriented to person, place, and time.  Skin: Skin is warm and dry.  Psychiatric: He has a normal mood and affect. His behavior is normal. Thought content normal.  Vitals reviewed.         Assessment & Plan:

## 2015-04-17 NOTE — Assessment & Plan Note (Signed)
New.  Pt's sxs and spontaneous resolution are more consistent w/ constipation related pain/colonic spasm than diverticulitis but will get CBC to r/o infxn.  Get KUB to assess stool burden. Discussed bowel regimen of increased water, miralax.  Pt has only minimal tenderness on exam today.  Has GI appt upcoming as this is not first episode.  Reviewed supportive care and red flags that should prompt return. Pt expressed understanding and is in agreement w/ plan.

## 2015-04-17 NOTE — Patient Instructions (Signed)
Follow up as needed We'll notify you of your lab results and xray report (stop downstairs on your way out) Drink plenty of fluids Start Miralax once daily- adjusting dose as needed- to goal of bowel movement daily Call with any questions or concerns- particularly if symptoms change or worsen Hang in there!!!

## 2015-04-17 NOTE — Progress Notes (Signed)
Pre visit review using our clinic review tool, if applicable. No additional management support is needed unless otherwise documented below in the visit note. 

## 2015-04-18 ENCOUNTER — Other Ambulatory Visit: Payer: Self-pay | Admitting: Family Medicine

## 2015-04-18 DIAGNOSIS — D72829 Elevated white blood cell count, unspecified: Secondary | ICD-10-CM

## 2015-04-18 MED ORDER — CIPROFLOXACIN HCL 500 MG PO TABS: 500.0000 mg | ORAL_TABLET | Freq: Two times a day (BID) | ORAL | Status: DC

## 2015-04-18 MED ORDER — METRONIDAZOLE 500 MG PO TABS: 500.0000 mg | ORAL_TABLET | Freq: Three times a day (TID) | ORAL | Status: DC

## 2015-04-19 ENCOUNTER — Other Ambulatory Visit (INDEPENDENT_AMBULATORY_CARE_PROVIDER_SITE_OTHER): Payer: BLUE CROSS/BLUE SHIELD

## 2015-04-19 DIAGNOSIS — D72829 Elevated white blood cell count, unspecified: Secondary | ICD-10-CM | POA: Diagnosis not present

## 2015-04-19 LAB — CBC WITH DIFFERENTIAL/PLATELET
BASOS PCT: 0.4 % (ref 0.0–3.0)
Basophils Absolute: 0 10*3/uL (ref 0.0–0.1)
EOS ABS: 0.2 10*3/uL (ref 0.0–0.7)
Eosinophils Relative: 2.5 % (ref 0.0–5.0)
HCT: 42.3 % (ref 39.0–52.0)
Hemoglobin: 14.4 g/dL (ref 13.0–17.0)
Lymphocytes Relative: 17.9 % (ref 12.0–46.0)
Lymphs Abs: 1.3 10*3/uL (ref 0.7–4.0)
MCHC: 34 g/dL (ref 30.0–36.0)
MCV: 82.6 fl (ref 78.0–100.0)
MONO ABS: 0.6 10*3/uL (ref 0.1–1.0)
Monocytes Relative: 8.7 % (ref 3.0–12.0)
Neutro Abs: 5 10*3/uL (ref 1.4–7.7)
Neutrophils Relative %: 70.5 % (ref 43.0–77.0)
PLATELETS: 331 10*3/uL (ref 150.0–400.0)
RBC: 5.12 Mil/uL (ref 4.22–5.81)
RDW: 13.3 % (ref 11.5–15.5)
WBC: 7.1 10*3/uL (ref 4.0–10.5)

## 2015-06-14 ENCOUNTER — Encounter: Payer: Self-pay | Admitting: Gastroenterology

## 2015-06-17 ENCOUNTER — Ambulatory Visit: Payer: Self-pay | Admitting: Gastroenterology

## 2015-07-19 ENCOUNTER — Other Ambulatory Visit: Payer: Self-pay | Admitting: Family Medicine

## 2015-07-19 ENCOUNTER — Other Ambulatory Visit (INDEPENDENT_AMBULATORY_CARE_PROVIDER_SITE_OTHER): Payer: BLUE CROSS/BLUE SHIELD

## 2015-07-19 DIAGNOSIS — E785 Hyperlipidemia, unspecified: Secondary | ICD-10-CM | POA: Diagnosis not present

## 2015-07-19 DIAGNOSIS — E042 Nontoxic multinodular goiter: Secondary | ICD-10-CM

## 2015-07-19 LAB — LIPID PANEL
CHOLESTEROL: 161 mg/dL (ref 0–200)
HDL: 37.4 mg/dL — AB (ref >39.00)
LDL Cholesterol: 93 mg/dL (ref 0–99)
NonHDL: 123.38
TRIGLYCERIDES: 153 mg/dL — AB (ref 0.0–149.0)
Total CHOL/HDL Ratio: 4
VLDL: 30.6 mg/dL (ref 0.0–40.0)

## 2015-07-19 LAB — BASIC METABOLIC PANEL
BUN: 18 mg/dL (ref 6–23)
CO2: 26 meq/L (ref 19–32)
CREATININE: 1.01 mg/dL (ref 0.40–1.50)
Calcium: 9.6 mg/dL (ref 8.4–10.5)
Chloride: 103 mEq/L (ref 96–112)
GFR: 85.26 mL/min (ref >60.00)
GLUCOSE: 90 mg/dL (ref 70–99)
Potassium: 4.2 mEq/L (ref 3.5–5.1)
Sodium: 138 mEq/L (ref 135–145)

## 2015-07-19 LAB — TSH: TSH: 1.44 u[IU]/mL (ref 0.35–4.50)

## 2015-07-23 ENCOUNTER — Encounter: Payer: BLUE CROSS/BLUE SHIELD | Admitting: Family Medicine

## 2015-07-24 ENCOUNTER — Ambulatory Visit (INDEPENDENT_AMBULATORY_CARE_PROVIDER_SITE_OTHER): Payer: BLUE CROSS/BLUE SHIELD | Admitting: Family Medicine

## 2015-07-24 ENCOUNTER — Encounter: Payer: Self-pay | Admitting: Family Medicine

## 2015-07-24 VITALS — BP 116/78 | HR 84 | Temp 98.3°F | Ht 70.5 in | Wt 229.0 lb

## 2015-07-24 DIAGNOSIS — Z23 Encounter for immunization: Secondary | ICD-10-CM | POA: Diagnosis not present

## 2015-07-24 DIAGNOSIS — E785 Hyperlipidemia, unspecified: Secondary | ICD-10-CM

## 2015-07-24 DIAGNOSIS — R0683 Snoring: Secondary | ICD-10-CM

## 2015-07-24 DIAGNOSIS — R1030 Lower abdominal pain, unspecified: Secondary | ICD-10-CM

## 2015-07-24 DIAGNOSIS — K219 Gastro-esophageal reflux disease without esophagitis: Secondary | ICD-10-CM

## 2015-07-24 DIAGNOSIS — Z0000 Encounter for general adult medical examination without abnormal findings: Secondary | ICD-10-CM

## 2015-07-24 DIAGNOSIS — E669 Obesity, unspecified: Secondary | ICD-10-CM

## 2015-07-24 DIAGNOSIS — E042 Nontoxic multinodular goiter: Secondary | ICD-10-CM

## 2015-07-24 DIAGNOSIS — F419 Anxiety disorder, unspecified: Secondary | ICD-10-CM

## 2015-07-24 HISTORY — DX: Snoring: R06.83

## 2015-07-24 MED ORDER — ATORVASTATIN CALCIUM 10 MG PO TABS: 10.0000 mg | ORAL_TABLET | Freq: Every day | ORAL | Status: DC

## 2015-07-24 MED ORDER — FENOFIBRATE MICRONIZED 130 MG PO CAPS: 130.0000 mg | ORAL_CAPSULE | Freq: Every day | ORAL | Status: DC

## 2015-07-24 NOTE — Assessment & Plan Note (Signed)
No significant OSA sxs. Pt declines sleep eval referral, will monitor sxs for now. Encouraged weight loss.

## 2015-07-24 NOTE — Assessment & Plan Note (Signed)
TSH stable. Exam stable.

## 2015-07-24 NOTE — Assessment & Plan Note (Signed)
Chronic, stable. Continue lipitor/fenofibrate. Tolerating well.

## 2015-07-24 NOTE — Assessment & Plan Note (Addendum)
Rare xanax use for situational anxiety.

## 2015-07-24 NOTE — Assessment & Plan Note (Signed)
Preventative protocols reviewed and updated unless pt declined. Discussed healthy diet and lifestyle.  

## 2015-07-24 NOTE — Assessment & Plan Note (Signed)
With lower abd pain, constipation and mild leukocytosis (checked towards end of illness), anticipate episode of diverticulitis that resolved without antibiotics 04/2015. Recommended high fiber diet, consider metamucil daily. Has f/u with GI scheduled for Feb.

## 2015-07-24 NOTE — Addendum Note (Signed)
Addended by: Yauco Lions on: 07/24/2015 10:22 AM   Modules accepted: Orders

## 2015-07-24 NOTE — Progress Notes (Signed)
BP 116/78 mmHg  Pulse 84  Temp(Src) 98.3 F (36.8 C) (Oral)  Ht 5' 10.5" (1.791 m)  Wt 229 lb (103.874 kg)  BMI 32.38 kg/m2  SpO2 96%   CC: CPE  Subjective:    Patient ID: Erik Cole, male    DOB: 11/17/1970, 45 y.o.   MRN: [id number]  HPI: Erik Cole is a 45 y.o. male presenting on 07/24/2015 for Annual Exam   Rare xanax use for anxiety.   Episodes of severe lower abdominal pain every 6 months associated constipation. No fever, nausea/vomiting. Last occurred Oct, saw Dr Birdie Riddle. Thought constipation related colon spasm. KUB ok. WBC elevated at 12.1. Started taking miralax 1 tablespoon every few days. Scheduled to see GI Feb.   Wife worried about trouble sleeping. He does snore. No witnessed apneic episodes, more "heavy irregular breathing". No daytime somnolence. Endorses restorative sleep. H/o allergic rhinitis causing trouble sleeping. Takes prn zyrtec.   Preventative: Tetanus done 10/2009 Flu shot - today Seat belt 100%  Sunscreen use discussed. No changing moles on skin.  Caffeine: occasionally.  Lives with wife [name]), 2 children (son and daughter), 1 cat  Occupation: Radiation protection practitioner at [employer]: no regular exercise, walks at work. Stationary bike at home  Diet: good water, daily fruits/vegetables, red meat rarely, fish 1x/wk, cut out fast food   Relevant past medical, surgical, family and social history reviewed and updated as indicated. Interim medical history since our last visit reviewed. Allergies and medications reviewed and updated. Current Outpatient Prescriptions on File Prior to Visit  Medication Sig  . ALPRAZolam (XANAX) 0.5 MG tablet TAKE 1 TABLET BY MOUTH DAILY  . omeprazole (PRILOSEC) 20 MG capsule Take 1 capsule (20 mg total) by mouth daily as needed.   No current facility-administered medications on file prior to visit.    Review of Systems  Constitutional: Negative for fever, chills, activity change, appetite change,  fatigue and unexpected weight change.  HENT: Negative for hearing loss.   Eyes: Negative for visual disturbance.  Respiratory: Negative for cough, chest tightness, shortness of breath and wheezing.   Cardiovascular: Negative for chest pain, palpitations and leg swelling.  Gastrointestinal: Negative for nausea, vomiting, abdominal pain, diarrhea, constipation, blood in stool and abdominal distention.  Genitourinary: Negative for hematuria and difficulty urinating.  Musculoskeletal: Negative for myalgias, arthralgias and neck pain.  Skin: Negative for rash.  Neurological: Negative for dizziness, seizures, syncope and headaches.  Hematological: Negative for adenopathy. Does not bruise/bleed easily.  Psychiatric/Behavioral: Negative for dysphoric mood. The patient is not nervous/anxious.    Per HPI unless specifically indicated in ROS section     Objective:    BP 116/78 mmHg  Pulse 84  Temp(Src) 98.3 F (36.8 C) (Oral)  Ht 5' 10.5" (1.791 m)  Wt 229 lb (103.874 kg)  BMI 32.38 kg/m2  SpO2 96%  Wt Readings from Last 3 Encounters:  07/24/15 229 lb (103.874 kg)  04/17/15 234 lb (106.142 kg)  02/25/15 220 lb (99.791 kg)    Physical Exam  Constitutional: He is oriented to person, place, and time. He appears well-developed and well-nourished. No distress.  HENT:  Head: Normocephalic and atraumatic.  Right Ear: Hearing, tympanic membrane, external ear and ear canal normal.  Left Ear: Hearing, tympanic membrane, external ear and ear canal normal.  Nose: Nose normal.  Mouth/Throat: Uvula is midline, oropharynx is clear and moist and mucous membranes are normal. No oropharyngeal exudate, posterior oropharyngeal edema or posterior oropharyngeal erythema.  Eyes: Conjunctivae and EOM are normal. Pupils  are equal, round, and reactive to light. No scleral icterus.  Neck: Normal range of motion. Neck supple. No thyromegaly present.  Cardiovascular: Normal rate, regular rhythm, normal heart sounds  and intact distal pulses.   No murmur heard. Pulses:      Radial pulses are 2+ on the right side, and 2+ on the left side.  Pulmonary/Chest: Effort normal and breath sounds normal. No respiratory distress. He has no wheezes. He has no rales.  Abdominal: Soft. Bowel sounds are normal. He exhibits no distension and no mass. There is no tenderness. There is no rebound and no guarding.  Musculoskeletal: Normal range of motion. He exhibits no edema.  Lymphadenopathy:    He has no cervical adenopathy.  Neurological: He is alert and oriented to person, place, and time.  CN grossly intact, station and gait intact  Skin: Skin is warm and dry. No rash noted.  Psychiatric: He has a normal mood and affect. His behavior is normal. Judgment and thought content normal.  Nursing note and vitals reviewed.  Results for orders placed or performed in visit on 07/19/15  Lipid panel  Result Value Ref Range   Cholesterol 161 0 - 200 mg/dL   Triglycerides 153.0 (H) 0.0 - 149.0 mg/dL   HDL 37.40 (L) >39.00 mg/dL   VLDL 30.6 0.0 - 40.0 mg/dL   LDL Cholesterol 93 0 - 99 mg/dL   Total CHOL/HDL Ratio 4    NonHDL 123.38   TSH  Result Value Ref Range   TSH 1.44 0.35 - 4.50 uIU/mL  Basic metabolic panel  Result Value Ref Range   Sodium 138 135 - 145 mEq/L   Potassium 4.2 3.5 - 5.1 mEq/L   Chloride 103 96 - 112 mEq/L   CO2 26 19 - 32 mEq/L   Glucose, Bld 90 70 - 99 mg/dL   BUN 18 6 - 23 mg/dL   Creatinine, Ser 1.01 0.40 - 1.50 mg/dL   Calcium 9.6 8.4 - 10.5 mg/dL   GFR 85.26 >60.00 mL/min      Assessment & Plan:   Problem List Items Addressed This Visit    Snoring    No significant OSA sxs. Pt declines sleep eval referral, will monitor sxs for now. Encouraged weight loss.      Obesity, Class I, BMI 30-34.9    Discussed healthy diet and lifestyle changes to affect sustainable weight loss.      Nontoxic multinodular goiter    TSH stable. Exam stable.      Lower abdominal pain    With lower abd  pain, constipation and mild leukocytosis (checked towards end of illness), anticipate episode of diverticulitis that resolved without antibiotics 04/2015. Recommended high fiber diet, consider metamucil daily. Has f/u with GI scheduled for Feb.       Healthcare maintenance - Primary    Preventative protocols reviewed and updated unless pt declined. Discussed healthy diet and lifestyle.       GERD (gastroesophageal reflux disease)    Stable on low dose omperazole 20mg  prn.      Dyslipidemia    Chronic, stable. Continue lipitor/fenofibrate. Tolerating well.      Relevant Medications   atorvastatin (LIPITOR) 10 MG tablet   fenofibrate micronized (ANTARA) 130 MG capsule   Anxiety, mild    Rare xanax use for situational anxiety.           Follow up plan: Return in about 1 year (around 07/23/2016), or as needed, for annual exam, prior fasting for blood work.

## 2015-07-24 NOTE — Assessment & Plan Note (Signed)
Stable on low dose omperazole 20mg  prn.

## 2015-07-24 NOTE — Progress Notes (Signed)
Pre visit review using our clinic review tool, if applicable. No additional management support is needed unless otherwise documented below in the visit note. 

## 2015-07-24 NOTE — Assessment & Plan Note (Addendum)
Discussed healthy diet and lifestyle changes to affect sustainable weight loss  

## 2015-07-24 NOTE — Patient Instructions (Addendum)
Flu shot today. I recommend high fiber diet - consider metamucil. Increase water intake. Fiber in diet handout provided today. Keep GI appointment. Continue working out on stationary bicycle Good to see you today, call us with questions. Return as needed or in 1 year for next physical.  Health Maintenance, Male A healthy lifestyle and preventative care can promote health and wellness.  Maintain regular health, dental, and eye exams.  Eat a healthy diet. Foods like vegetables, fruits, whole grains, low-fat dairy products, and lean protein foods contain the nutrients you need and are low in calories. Decrease your intake of foods high in solid fats, added sugars, and salt. Get information about a proper diet from your health care provider, if necessary.  Regular physical exercise is one of the most important things you can do for your health. Most adults should get at least 150 minutes of moderate-intensity exercise (any activity that increases your heart rate and causes you to sweat) each week. In addition, most adults need muscle-strengthening exercises on 2 or more days a week.   Maintain a healthy weight. The body mass index (BMI) is a screening tool to identify possible weight problems. It provides an estimate of body fat based on height and weight. Your health care provider can find your BMI and can help you achieve or maintain a healthy weight. For males 20 years and older:  A BMI below 18.5 is considered underweight.  A BMI of 18.5 to 24.9 is normal.  A BMI of 25 to 29.9 is considered overweight.  A BMI of 30 and above is considered obese.  Maintain normal blood lipids and cholesterol by exercising and minimizing your intake of saturated fat. Eat a balanced diet with plenty of fruits and vegetables. Blood tests for lipids and cholesterol should begin at age 23 and be repeated every 5 years. If your lipid or cholesterol levels are high, you are over age 26, or you are at high risk for  heart disease, you may need your cholesterol levels checked more frequently.Ongoing high lipid and cholesterol levels should be treated with medicines if diet and exercise are not working.  If you smoke, find out from your health care provider how to quit. If you do not use tobacco, do not start.  Lung cancer screening is recommended for adults aged 28-80 years who are at high risk for developing lung cancer because of a history of smoking. A yearly low-dose CT scan of the lungs is recommended for people who have at least a 30-pack-year history of smoking and are current smokers or have quit within the past 15 years. A pack year of smoking is smoking an average of 1 pack of cigarettes a day for 1 year (for example, a 30-pack-year history of smoking could mean smoking 1 pack a day for 30 years or 2 packs a day for 15 years). Yearly screening should continue until the smoker has stopped smoking for at least 15 years. Yearly screening should be stopped for people who develop a health problem that would prevent them from having lung cancer treatment.  If you choose to drink alcohol, do not have more than 2 drinks per day. One drink is considered to be 12 oz (360 mL) of beer, 5 oz (150 mL) of wine, or 1.5 oz (45 mL) of liquor.  Avoid the use of street drugs. Do not share needles with anyone. Ask for help if you need support or instructions about stopping the use of drugs.  High blood  pressure causes heart disease and increases the risk of stroke. High blood pressure is more likely to develop in:  People who have blood pressure in the end of the normal range (100-139/85-89 mm Hg).  People who are overweight or obese.  People who are African American.  If you are 31-66 years of age, have your blood pressure checked every 3-5 years. If you are 68 years of age or older, have your blood pressure checked every year. You should have your blood pressure measured twice--once when you are at a hospital or  clinic, and once when you are not at a hospital or clinic. Record the average of the two measurements. To check your blood pressure when you are not at a hospital or clinic, you can use:  An automated blood pressure machine at a pharmacy.  A home blood pressure monitor.  If you are 66-65 years old, ask your health care provider if you should take aspirin to prevent heart disease.  Diabetes screening involves taking a blood sample to check your fasting blood sugar level. This should be done once every 3 years after age 34 if you are at a normal weight and without risk factors for diabetes. Testing should be considered at a younger age or be carried out more frequently if you are overweight and have at least 1 risk factor for diabetes.  Colorectal cancer can be detected and often prevented. Most routine colorectal cancer screening begins at the age of 20 and continues through age 68. However, your health care provider may recommend screening at an earlier age if you have risk factors for colon cancer. On a yearly basis, your health care provider may provide home test kits to check for hidden blood in the stool. A small camera at the end of a tube may be used to directly examine the colon (sigmoidoscopy or colonoscopy) to detect the earliest forms of colorectal cancer. Talk to your health care provider about this at age 4 when routine screening begins. A direct exam of the colon should be repeated every 5-10 years through age 71, unless early forms of precancerous polyps or small growths are found.  People who are at an increased risk for hepatitis B should be screened for this virus. You are considered at high risk for hepatitis B if:  You were born in a country where hepatitis B occurs often. Talk with your health care provider about which countries are considered high risk.  Your parents were born in a high-risk country and you have not received a shot to protect against hepatitis B (hepatitis B  vaccine).  You have HIV or AIDS.  You use needles to inject street drugs.  You live with, or have sex with, someone who has hepatitis B.  You are a man who has sex with other men (MSM).  You get hemodialysis treatment.  You take certain medicines for conditions like cancer, organ transplantation, and autoimmune conditions.  Hepatitis C blood testing is recommended for all people born from [birth year] through 1965 and any individual with known risk factors for hepatitis C.  Healthy men should no longer receive prostate-specific antigen (PSA) blood tests as part of routine cancer screening. Talk to your health care provider about prostate cancer screening.  Testicular cancer screening is not recommended for adolescents or adult males who have no symptoms. Screening includes self-exam, a health care provider exam, and other screening tests. Consult with your health care provider about any symptoms you have or any concerns you  have about testicular cancer.  Practice safe sex. Use condoms and avoid high-risk sexual practices to reduce the spread of sexually transmitted infections (STIs).  You should be screened for STIs, including gonorrhea and chlamydia if:  You are sexually active and are younger than 24 years.  You are older than 24 years, and your health care provider tells you that you are at risk for this type of infection.  Your sexual activity has changed since you were last screened, and you are at an increased risk for chlamydia or gonorrhea. Ask your health care provider if you are at risk.  If you are at risk of being infected with HIV, it is recommended that you take a prescription medicine daily to prevent HIV infection. This is called pre-exposure prophylaxis (PrEP). You are considered at risk if:  You are a man who has sex with other men (MSM).  You are a heterosexual man who is sexually active with multiple partners.  You take drugs by injection.  You are sexually active  with a partner who has HIV.  Talk with your health care provider about whether you are at high risk of being infected with HIV. If you choose to begin PrEP, you should first be tested for HIV. You should then be tested every 3 months for as long as you are taking PrEP.  Use sunscreen. Apply sunscreen liberally and repeatedly throughout the day. You should seek shade when your shadow is shorter than you. Protect yourself by wearing long sleeves, pants, a wide-brimmed hat, and sunglasses year round whenever you are outdoors.  Tell your health care provider of new moles or changes in moles, especially if there is a change in shape or color. Also, tell your health care provider if a mole is larger than the size of a pencil eraser.  A one-time screening for abdominal aortic aneurysm (AAA) and surgical repair of large AAAs by ultrasound is recommended for men aged 16-75 years who are current or former smokers.  Stay current with your vaccines (immunizations).   This information is not intended to replace advice given to you by your health care provider. Make sure you discuss any questions you have with your health care provider.   Document Released: 12/26/2007 Document Revised: 07/20/2014 Document Reviewed: 11/24/2010 Elsevier Interactive Patient Education [employer].

## 2015-07-29 ENCOUNTER — Other Ambulatory Visit: Payer: Self-pay | Admitting: Family Medicine

## 2015-07-29 NOTE — Telephone Encounter (Signed)
plz phone in. 

## 2015-07-30 NOTE — Telephone Encounter (Signed)
Rx called in as directed.   

## 2015-08-20 ENCOUNTER — Encounter: Payer: Self-pay | Admitting: Gastroenterology

## 2015-08-20 ENCOUNTER — Ambulatory Visit (INDEPENDENT_AMBULATORY_CARE_PROVIDER_SITE_OTHER): Payer: BLUE CROSS/BLUE SHIELD | Admitting: Gastroenterology

## 2015-08-20 VITALS — BP 128/78 | HR 72 | Ht 70.5 in | Wt 231.0 lb

## 2015-08-20 DIAGNOSIS — R109 Unspecified abdominal pain: Secondary | ICD-10-CM

## 2015-08-20 DIAGNOSIS — R194 Change in bowel habit: Secondary | ICD-10-CM

## 2015-08-20 DIAGNOSIS — K219 Gastro-esophageal reflux disease without esophagitis: Secondary | ICD-10-CM

## 2015-08-20 NOTE — Patient Instructions (Signed)
You have been scheduled for a CT scan of the abdomen and pelvis at [facility] ([address]---this is in the same building as Press [facility]).   You are scheduled on 08/23/2015 at 8:00am. You should arrive 15 minutes prior to your appointment time for registration. Please follow the written instructions below on the day of your exam:  WARNING: IF YOU ARE ALLERGIC TO IODINE/X-RAY DYE, PLEASE NOTIFY RADIOLOGY IMMEDIATELY AT [phone]! YOU WILL BE GIVEN A 13 HOUR PREMEDICATION PREP.  1) Do not eat or drink anything after 4:00am (4 hours prior to your test) 2) You have been given 2 bottles of oral contrast to drink. The solution may taste better if refrigerated, but do NOT add ice or any other liquid to this solution. Shake well before drinking.    Drink 1 bottle of contrast @ 6:00am (2 hours prior to your exam)  Drink 1 bottle of contrast @ 7:00am (1 hour prior to your exam)  You may take any medications as prescribed with a small amount of water except for the following: Metformin, Glucophage, Glucovance, Avandamet, Riomet, Fortamet, Actoplus Met, Janumet, Glumetza or Metaglip. The above medications must be held the day of the exam AND 48 hours after the exam.  The purpose of you drinking the oral contrast is to aid in the visualization of your intestinal tract. The contrast solution may cause some diarrhea. Before your exam is started, you will be given a small amount of fluid to drink. Depending on your individual set of symptoms, you may also receive an intravenous injection of x-ray contrast/dye. Plan on being at [facility] for 30 minutes or longer, depending on the type of exam you are having performed.  This test typically takes 30-45 minutes to complete.  If you have any questions regarding your exam or if you need to reschedule, you may call the CT department at [phone] between the hours of 8:00 am and 5:00 pm,  Monday-Friday.  ________________________________________________________________________

## 2015-08-20 NOTE — Progress Notes (Signed)
HPI :  45 y/o male with a history of HLD and GERD seen here in consultation for changes in bowel habits associated with abdominal pain.  Patient states a few years ago he suffered from his first episode of acute onset severe abdominal pain associated with obstipation. Since that time he reports every 3-6 months he has episodes of severe abdominal pain association with obstipation that prevent him from working and cause significant discomfort. Prior to this onset a few years ago this had never previously happened. He is not sure if dietary changes can precipitate his symptoms, he does not think it does. He reports with each episode he acutely stops passing both gas and stool and has severe pains diffusely in his abdomen. These episodes can last for 48 hours prior to resolving on their own, and during this time he is in significant discomfort. He denies nausea / vomiting. He reports he doesn't have much appetite however and tolerates only a bland diet such as soup until it passes. No fevers. No blood in the stools. He develops abdominal distension when this occurs. Pain is located diffusely and without a focal area.  In between episodes of pain he feels normal without problems. He has roughly one BM per day to every other day at baseline. No weight loss. No prior bowel surgeries. No prior bowel obstruction. He has never been hospitalized for these symptoms. He had been on miralax after he saw a prior provider for this previously to prevent constipation, but he is not sure if it has helped him. He has a hard time predicting when this is going to occur, it happens very quickly and is acute onset regardless of the frequency of his bowel movements in preceeding days prior to the episode.   No FH of CRC. No FH of Crohns or colitis. Father had diverticulitis. He has never had a prior colonoscopy.   He otherwise has prilosec use for GERD. He states it works well, no breakthrough. No dysphagia.   Past Medical  History  Diagnosis Date  . HLD (hyperlipidemia)     previous PCP stopped tricor 2/2 elevated LFTs 07/2010  . GERD (gastroesophageal reflux disease)   . History of chicken pox   . Transaminitis     hx mild, thought fibrate related - w/u ~2008 neg HbsAg, Hep C, ANA, nl iron/ferritin, ceruloplasmin, a1-antitrypsin, normal [place]  . Anxiety     mild, xanax sparingly  . Obesity   . 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 Ganglion cyst excision Right 2008     wrist   Family History  Problem Relation Age of Onset  . Hypertension Mother   . Hyperlipidemia Father     TG  . Diabetes Maternal Grandmother   . Coronary artery disease Paternal Grandfather 55    unhealthy  . Stroke Neg Hx   . Breast cancer Other 60    great grandmother, maternal   Social History  Substance Use Topics  . Smoking status: Never Smoker   . Smokeless tobacco: Never Used  . Alcohol Use: 0.0 oz/week    0 Standard drinks or equivalent per week     Comment: Rare   Current Outpatient Prescriptions  Medication Sig Dispense Refill  . ALPRAZolam (XANAX) 0.5 MG tablet TAKE 1 TABLET BY MOUTH DAILY 30 tablet 0  . atorvastatin (LIPITOR) 10 MG tablet Take 1 tablet (10 mg total) by mouth daily. 90 tablet 3  .  fenofibrate micronized (ANTARA) 130 MG capsule Take 1 capsule (130 mg total) by mouth daily. 90 capsule 3  . omeprazole (PRILOSEC) 20 MG capsule Take 1 capsule (20 mg total) by mouth daily as needed. 30 capsule 3   No current facility-administered medications for this visit.   Allergies  Allergen Reactions  . Tricor [Fenofibrate] Other (See Comments)    transaminitis     Review of Systems: All systems reviewed and negative except where noted in HPI.   Lab Results  Component Value Date   WBC 7.1 04/19/2015   HGB 14.4 04/19/2015   HCT 42.3 04/19/2015   MCV 82.6 04/19/2015   PLT 331.0 04/19/2015    Lab Results  Component Value Date   ALT 36  07/24/2014   AST 24 07/24/2014   ALKPHOS 54 07/24/2014   BILITOT 0.8 07/24/2014    Lab Results  Component Value Date   CREATININE 1.01 07/19/2015   BUN 18 07/19/2015   NA 138 07/19/2015   K 4.2 07/19/2015   CL 103 07/19/2015   CO2 26 07/19/2015     Physical Exam: BP 128/78 mmHg  Pulse 72  Ht 5' 10.5" (1.791 m)  Wt 231 lb (104.781 kg)  BMI 32.67 kg/m2 Constitutional: Pleasant,well-developed, male in no acute distress. HEENT: Normocephalic and atraumatic. Conjunctivae are normal. No scleral icterus. Neck supple.  Cardiovascular: Normal rate, regular rhythm.  Pulmonary/chest: Effort normal and breath sounds normal. No wheezing, rales or rhonchi. Abdominal: Soft, nondistended, nontender. Bowel sounds active throughout. There are no masses palpable. No hepatomegaly. Extremities: no edema Lymphadenopathy: No cervical adenopathy noted. Neurological: Alert and oriented to person place and time. Skin: Skin is warm and dry. No rashes noted. Psychiatric: Normal mood and affect. Behavior is normal.   ASSESSMENT AND PLAN: 45 y/o male with PMH as outlined above, presenting with recurrent episodes of acute onset abdominal pain with associated obstipation, which occur every 3-6 months as outlined above. Episodes occuring over the past few years. He has been treated for constipation however unclear if this has made any difference to date. The acute onset of pain with obstipation and inability to pass gas are concerning for obstructive pathology. Recommend in this light we initially evaluate his symptoms with a CT abdomen / pelvis. Pending this result, we may consider colonoscopy. He will continue miralax in the interim daily to keep stools loose and see if this helps prevent future episodes. We will let him know results of CT once available. Otherwise, regarding GERD management, PPI at present dose is working well for him without any significant breakthrough, he will continue this for now. All  questions answered.   Page Cellar, MD [facility] Pager [phone]  CC: Ria Bush, MD

## 2015-08-23 ENCOUNTER — Telehealth: Payer: Self-pay | Admitting: Gastroenterology

## 2015-08-23 ENCOUNTER — Encounter (HOSPITAL_COMMUNITY): Payer: Self-pay

## 2015-08-23 ENCOUNTER — Ambulatory Visit (HOSPITAL_COMMUNITY)
Admission: RE | Admit: 2015-08-23 | Discharge: 2015-08-23 | Disposition: A | Discharge status: Home / Self Care | Payer: BLUE CROSS/BLUE SHIELD | Source: Ambulatory Visit | Attending: Gastroenterology | Admitting: Gastroenterology

## 2015-08-23 DIAGNOSIS — K5732 Diverticulitis of large intestine without perforation or abscess without bleeding: Secondary | ICD-10-CM

## 2015-08-23 DIAGNOSIS — K639 Disease of intestine, unspecified: Secondary | ICD-10-CM | POA: Insufficient documentation

## 2015-08-23 DIAGNOSIS — R109 Unspecified abdominal pain: Secondary | ICD-10-CM | POA: Diagnosis not present

## 2015-08-23 DIAGNOSIS — K4090 Unilateral inguinal hernia, without obstruction or gangrene, not specified as recurrent: Secondary | ICD-10-CM | POA: Diagnosis not present

## 2015-08-23 IMAGING — CT CT ABD-PELV W/ CM
2 of 6 series · 16 of 46 positions shown, 18 images · IV contrast (OMNIPAQUE)
Comparison: None.

CLINICAL DATA: Patient with mid to lower abdominal pain
intermittently for 2 years.

EXAM:
CT ABDOMEN AND PELVIS WITH CONTRAST
TECHNIQUE: Multidetector CT imaging of the abdomen and pelvis was performed
using the standard protocol following bolus administration of
intravenous contrast.
CONTRAST:  100mL OMNIPAQUE IOHEXOL 300 MG/ML  SOLN

[Series 2: rtn a/p with · axial · 0.81mm/px · z∈[+816,+1280]mm · 13 of 105 slices shown, 15 images]
[im 6/105  soft-tissue]
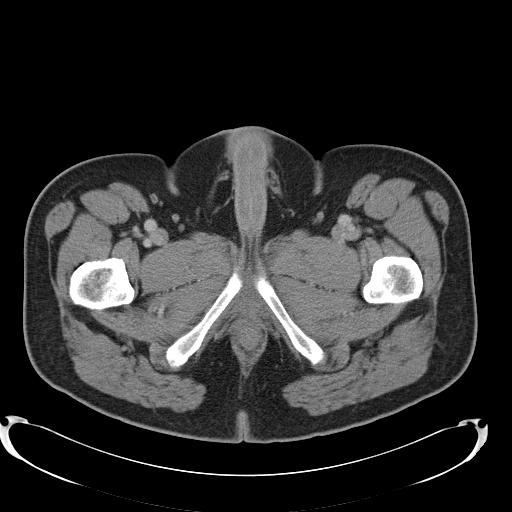
[im 6/105  bone]
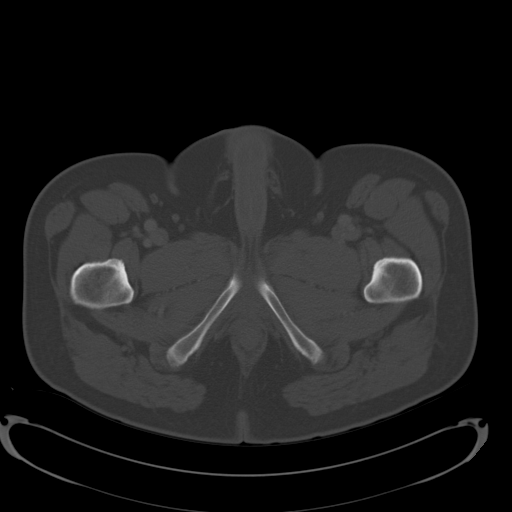
[im 12/105  soft-tissue]
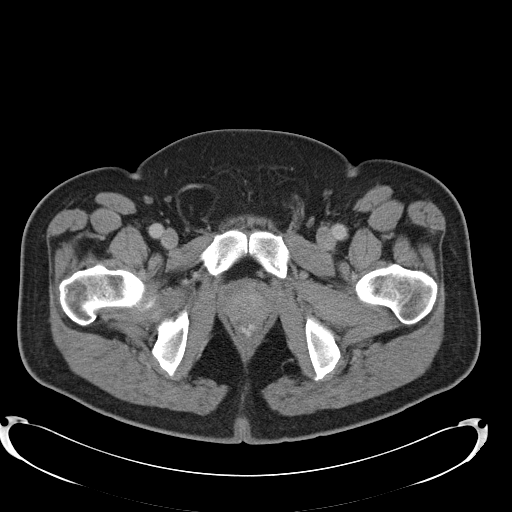
[im 24/105  soft-tissue]
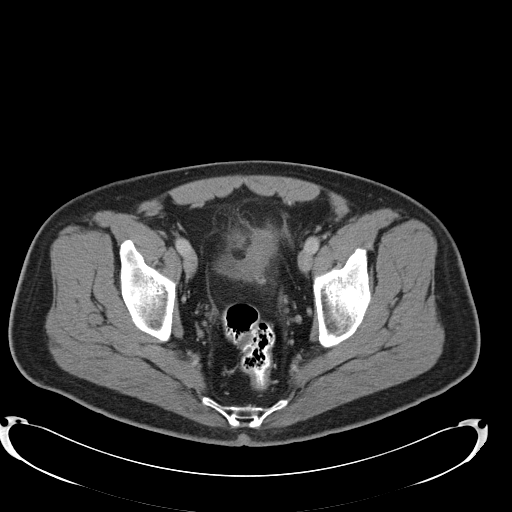
[im 29/105  soft-tissue]
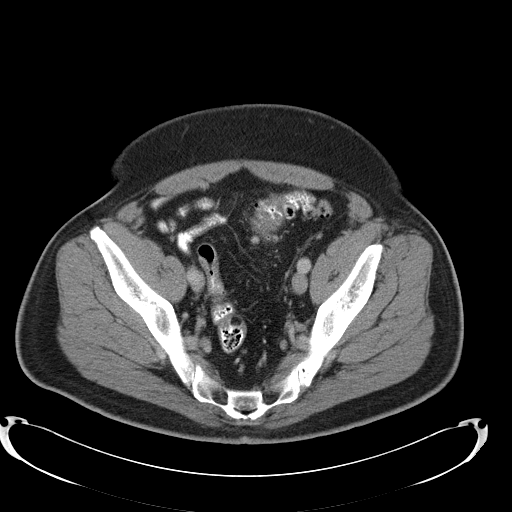
[im 35/105  soft-tissue]
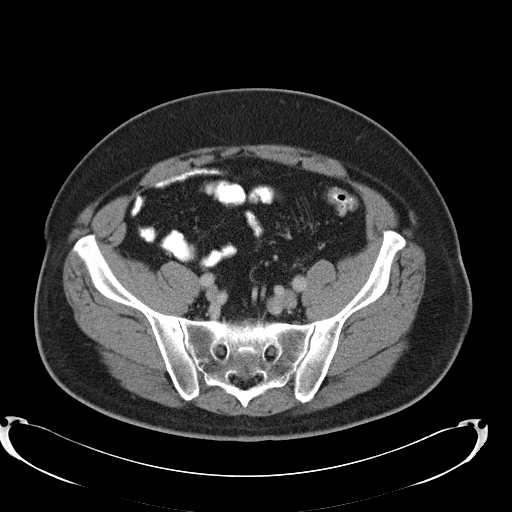
[im 47/105  soft-tissue]
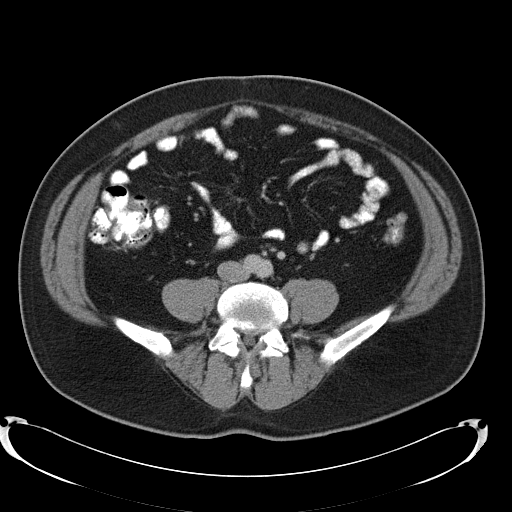
[im 53/105  soft-tissue]
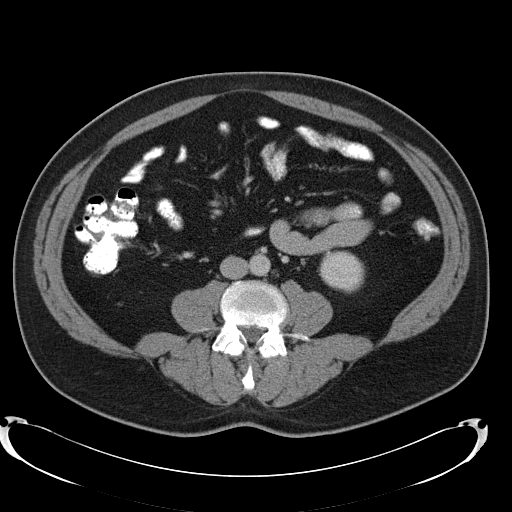
[im 58/105  soft-tissue]
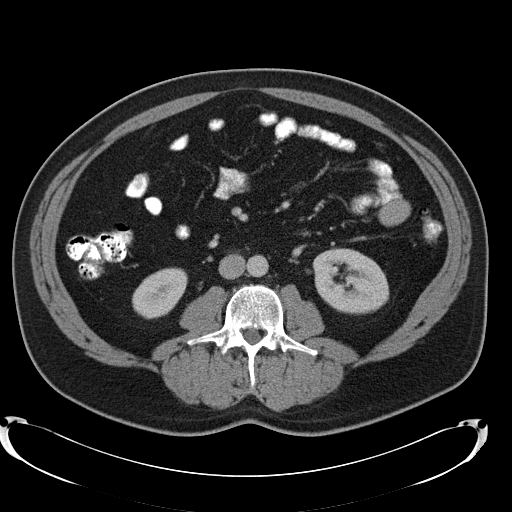
[im 70/105  soft-tissue]
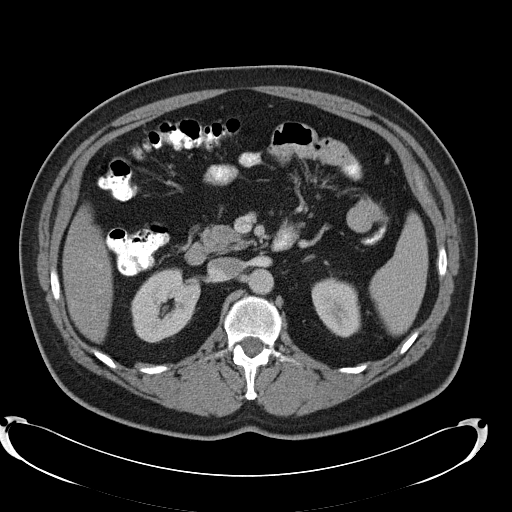
[im 70/105  bone]
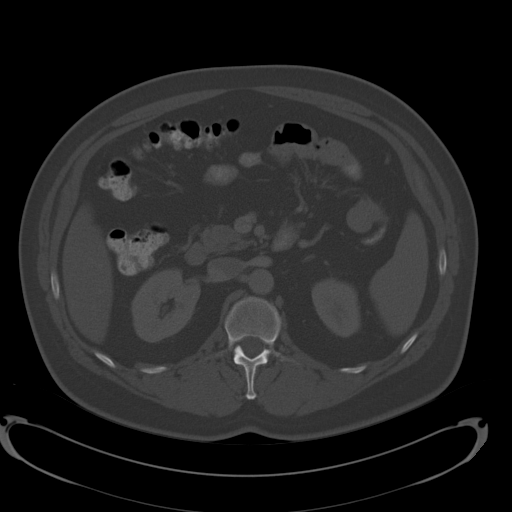
[im 76/105  soft-tissue]
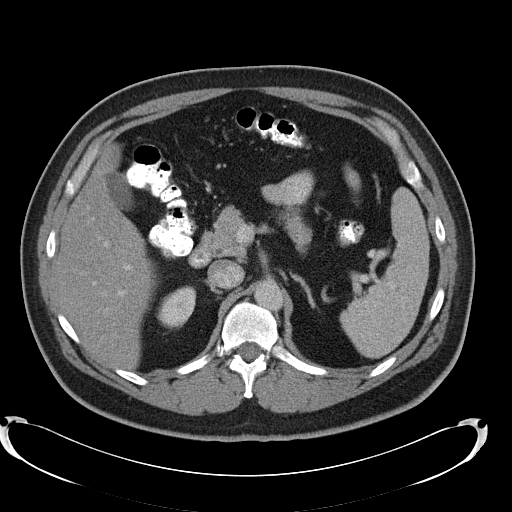
[im 81/105  soft-tissue]
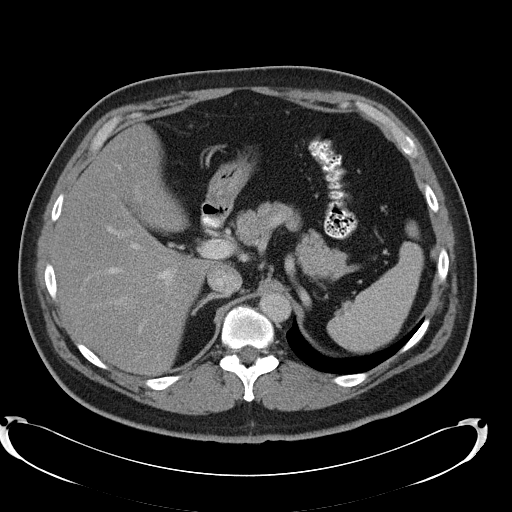
[im 93/105  soft-tissue]
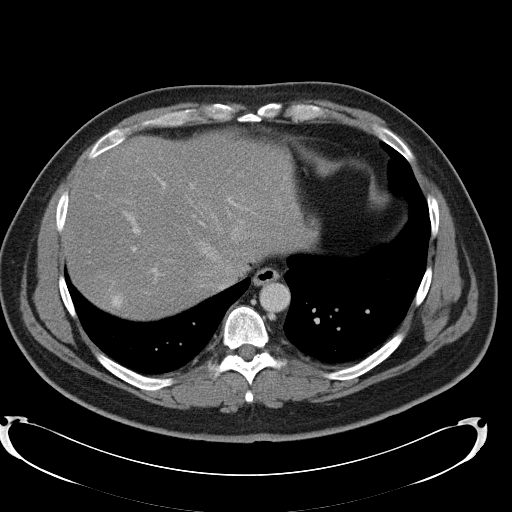
[im 99/105  soft-tissue]
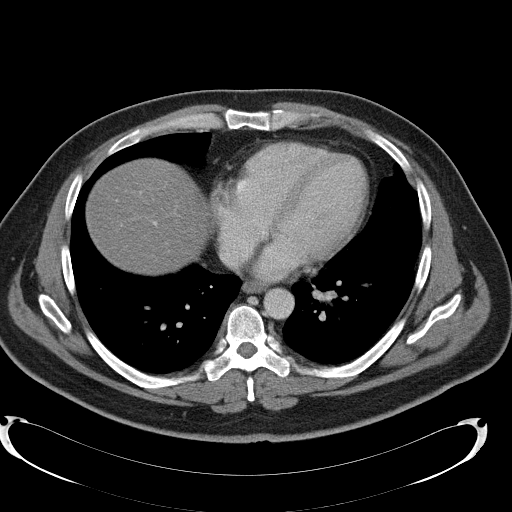

[Series 602: <mpr thick range> · coronal · 1.02mm/px · 3 of 103 slices shown]
[im 26/103  soft-tissue]
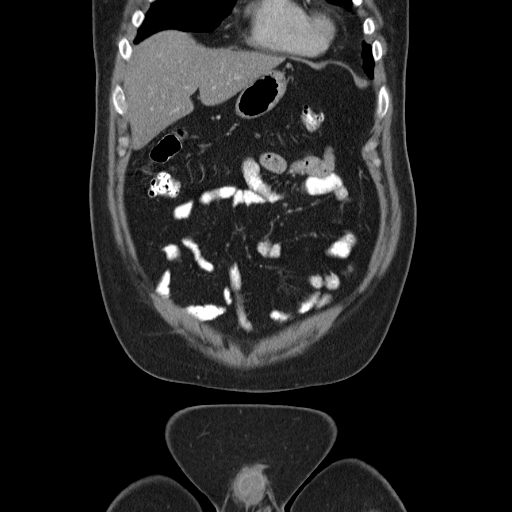
[im 52/103  soft-tissue]
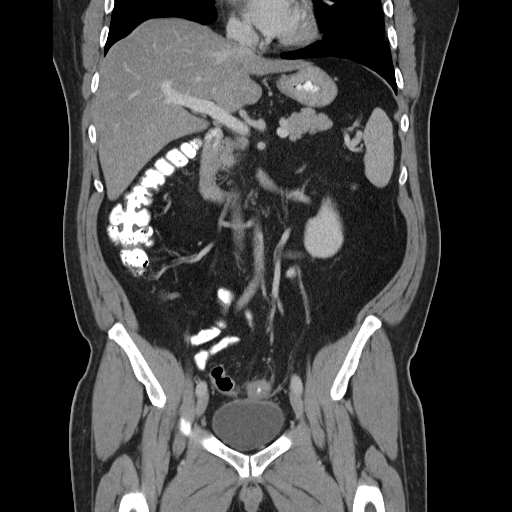
[im 77/103  soft-tissue]
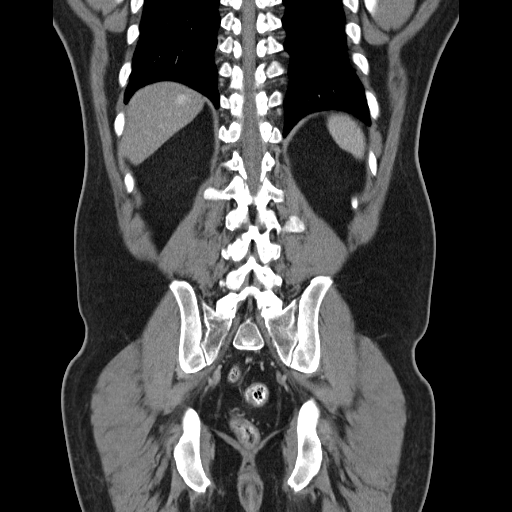

[16 of 46 positions shown; findings below may reference images not displayed]

FINDINGS: Lower chest: Minimal atelectasis bilateral lower lobes. No pleural
effusion. Normal heart size.

Hepatobiliary: There is a 1.3 cm enhancing lesion within the
posterior right hepatic lobe (image 21; series 2) and a 1.2 cm
enhancing lesion within the hepatic dome (image 13; series 2),
likely representing flash filling hemangiomas. Gallbladder is
unremarkable. No intrahepatic or extrahepatic biliary ductal
dilatation.

Pancreas: Unremarkable

Spleen: Unremarkable

Adrenals/Urinary Tract: The adrenal glands are normal. Kidneys
enhance symmetrically with contrast. No hydronephrosis.

Stomach/Bowel: Sigmoid colonic diverticulosis. Circumferential wall
thickening and pericolonic fat stranding about the sigmoid colon
compatible with acute sigmoid diverticulitis. No evidence for
perforation or abscess formation. Small amount of intramural
phlegmonous change measuring up to 11 mm within the sigmoid colon
(image 46; series 602). The appendix is normal. No free fluid or
free intraperitoneal air. Normal morphology of the stomach.

Vascular/Lymphatic: Normal caliber abdominal aorta. No
retroperitoneal lymphadenopathy.

Other: Fat containing right inguinal hernia. Prostate is
unremarkable.

Musculoskeletal: Lumbar spine degenerative changes. No aggressive or
acute appearing osseous lesions.
IMPRESSION: Findings compatible with acute sigmoid diverticulitis. No evidence
for perforation. Recommend correlation with colonoscopy if not
previously performed upon resolution of the acute symptomatology.

Fat containing right inguinal hernia.

## 2015-08-23 MED ORDER — METRONIDAZOLE 500 MG PO TABS: 500.0000 mg | ORAL_TABLET | Freq: Three times a day (TID) | ORAL | Status: DC

## 2015-08-23 MED ORDER — CIPROFLOXACIN HCL 500 MG PO TABS: 500.0000 mg | ORAL_TABLET | Freq: Two times a day (BID) | ORAL | Status: DC

## 2015-08-23 MED ORDER — IOHEXOL 300 MG/ML  SOLN
100.0000 mL | Freq: Once | INTRAMUSCULAR | Status: AC | PRN
  Administered 2015-08-23: 100 mL via INTRAVENOUS

## 2015-08-23 NOTE — Telephone Encounter (Signed)
Called the patient twice this afternoon and could not get ahold of him. Called and spoke with his wife and relayed results of CT scan. He has diverticulitis noted on CT however this seems a bit unusual to cause his chronic symptoms. Recommend treating him acutely with antibiotics, recommend 14 day course of Cipro and Flagyl. Roughly 4-6 weeks after antibiotics he will need a colonoscopy to confirm CT findings and ensure no evidence of a polyp or mass lesion which look like this on CT. I have ordered the medication to the pharmacy and his wife verbalized understanding.   Rollene Fare, can you please contact this patient Monday and help coordinate a follow up colonoscopy for him? Thanks much

## 2015-08-26 NOTE — Telephone Encounter (Signed)
Left a message for patient to call back to schedule colonoscopy.

## 2015-08-26 NOTE — Telephone Encounter (Signed)
Spoke with patient and scheduled colonoscopy on 10/31/15 at 11:00 AM and pre visit on 10/24/15 at 3:30 PM.

## 2015-10-12 HISTORY — PX: COLONOSCOPY: SHX174

## 2015-10-21 ENCOUNTER — Ambulatory Visit (AMBULATORY_SURGERY_CENTER): Payer: Self-pay | Admitting: *Deleted

## 2015-10-21 VITALS — Ht 71.0 in | Wt 234.0 lb

## 2015-10-21 DIAGNOSIS — R1030 Lower abdominal pain, unspecified: Secondary | ICD-10-CM

## 2015-10-21 DIAGNOSIS — R194 Change in bowel habit: Secondary | ICD-10-CM

## 2015-10-21 MED ORDER — NA SULFATE-K SULFATE-MG SULF 17.5-3.13-1.6 GM/177ML PO SOLN: 1.0000 | Freq: Once | ORAL | Status: DC

## 2015-10-21 NOTE — Progress Notes (Signed)
No egg or soy allergy known to patient  No issues with past sedation with any surgeries  or procedures, no intubation problems  No diet pills per patient No home 02 use per patient  No blood thinners per patient  Pt denies issues with constipation since stopped abx Colon video to e mail    [email or web address]

## 2015-10-31 ENCOUNTER — Ambulatory Visit (AMBULATORY_SURGERY_CENTER): Payer: BLUE CROSS/BLUE SHIELD | Admitting: Gastroenterology

## 2015-10-31 ENCOUNTER — Encounter: Payer: Self-pay | Admitting: Gastroenterology

## 2015-10-31 VITALS — BP 108/75 | HR 76 | Temp 98.4°F | Resp 14 | Ht 71.0 in | Wt 234.0 lb

## 2015-10-31 DIAGNOSIS — R1030 Lower abdominal pain, unspecified: Secondary | ICD-10-CM

## 2015-10-31 DIAGNOSIS — R1032 Left lower quadrant pain: Secondary | ICD-10-CM | POA: Diagnosis present

## 2015-10-31 DIAGNOSIS — D122 Benign neoplasm of ascending colon: Secondary | ICD-10-CM

## 2015-10-31 MED ORDER — SODIUM CHLORIDE 0.9 % IV SOLN: 500.0000 mL | INTRAVENOUS | Status: DC

## 2015-10-31 NOTE — Progress Notes (Signed)
To pacu vss patent aw report to rn 

## 2015-10-31 NOTE — Patient Instructions (Signed)
Discharge instructions given. Handouts on polyps,diverticulosis and hemorrhoids. Hold aspirin and all other anti-inflammatory medications for 2 weeks.Marland Kitchen Resume previous medications. YOU HAD AN ENDOSCOPIC PROCEDURE TODAY AT [facility]:   Refer to the procedure report that was given to you for any specific questions about what was found during the examination.  If the procedure report does not answer your questions, please call your gastroenterologist to clarify.  If you requested that your care partner not be given the details of your procedure findings, then the procedure report has been included in a sealed envelope for you to review at your convenience later.  YOU SHOULD EXPECT: Some feelings of bloating in the abdomen. Passage of more gas than usual.  Walking can help get rid of the air that was put into your GI tract during the procedure and reduce the bloating. If you had a lower endoscopy (such as a colonoscopy or flexible sigmoidoscopy) you may notice spotting of blood in your stool or on the toilet paper. If you underwent a bowel prep for your procedure, you may not have a normal bowel movement for a few days.  Please Note:  You might notice some irritation and congestion in your nose or some drainage.  This is from the oxygen used during your procedure.  There is no need for concern and it should clear up in a day or so.  SYMPTOMS TO REPORT IMMEDIATELY:   Following lower endoscopy (colonoscopy or flexible sigmoidoscopy):  Excessive amounts of blood in the stool  Significant tenderness or worsening of abdominal pains  Swelling of the abdomen that is new, acute  Fever of 100F or higher   For urgent or emergent issues, a gastroenterologist can be reached at any hour by calling [phone].   DIET: Your first meal following the procedure should be a small meal and then it is ok to progress to your normal diet. Heavy or fried foods are harder to digest and may make you  feel nauseous or bloated.  Likewise, meals heavy in dairy and vegetables can increase bloating.  Drink plenty of fluids but you should avoid alcoholic beverages for 24 hours.  ACTIVITY:  You should plan to take it easy for the rest of today and you should NOT DRIVE or use heavy machinery until tomorrow (because of the sedation medicines used during the test).    FOLLOW UP: Our staff will call the number listed on your records the next business day following your procedure to check on you and address any questions or concerns that you may have regarding the information given to you following your procedure. If we do not reach you, we will leave a message.  However, if you are feeling well and you are not experiencing any problems, there is no need to return our call.  We will assume that you have returned to your regular daily activities without incident.  If any biopsies were taken you will be contacted by phone or by letter within the next 1-3 weeks.  Please call us at [phone] if you have not heard about the biopsies in 3 weeks.    SIGNATURES/CONFIDENTIALITY: You and/or your care partner have signed paperwork which will be entered into your electronic medical record.  These signatures attest to the fact that that the information above on your After Visit Summary has been reviewed and is understood.  Full responsibility of the confidentiality of this discharge information lies with you and/or your care-partner.

## 2015-10-31 NOTE — Progress Notes (Signed)
Called to room to assist during endoscopic procedure.  Patient ID and intended procedure confirmed with present staff. Received instructions for my participation in the procedure from the performing physician.  

## 2015-10-31 NOTE — Op Note (Signed)
[facility] Patient Name: Erik Cole Procedure Date: 10/31/2015 11:01 AM MRN: [id number] Endoscopist: Remo Lipps P. Havery Moros , MD Age: 45 Date of Birth: 1970/10/21 Gender: Male Procedure:                Colonoscopy Indications:              Abdominal pain in the left lower quadrant, Abnormal                            CT of the GI tract concerning for diverticulitis Medicines:                Monitored Anesthesia Care Procedure:                Pre-Anesthesia Assessment:                           - Prior to the procedure, a History and Physical                            was performed, and patient medications and                            allergies were reviewed. The patient's tolerance of                            previous anesthesia was also reviewed. The risks                            and benefits of the procedure and the sedation                            options and risks were discussed with the patient.                            All questions were answered, and informed consent                            was obtained. Prior Anticoagulants: The patient has                            taken no previous anticoagulant or antiplatelet                            agents. ASA Grade Assessment: III - A patient with                            severe systemic disease. After reviewing the risks                            and benefits, the patient was deemed in                            satisfactory condition to undergo the procedure.  After obtaining informed consent, the colonoscope                            was passed under direct vision. Throughout the                            procedure, the patient's blood pressure, pulse, and                            oxygen saturations were monitored continuously. The                            Model CF-HQ190L [id number]) scope was introduced                            through the anus and advanced to  the the terminal                            ileum, with identification of the appendiceal                            orifice and IC valve. The colonoscopy was performed                            without difficulty. The patient tolerated the                            procedure well. The quality of the bowel                            preparation was adequate. The terminal ileum,                            ileocecal valve, appendiceal orifice, and rectum                            were photographed. Scope In: 11:09:27 AM Scope Out: 11:25:55 AM Scope Withdrawal Time: 0 hours 12 minutes 56 seconds  Total Procedure Duration: 0 hours 16 minutes 28 seconds  Findings:                 The perianal and digital rectal examinations were                            normal.                           A 8 mm polyp was found in the ascending colon. The                            polyp was flat. The polyp was removed with a cold                            snare. Resection and retrieval were complete.  Several medium-mouthed diverticula were found in                            the left colon, mostly sigmoid colon, with some                            patchy mild superficial erythema without ulceration                            or erosion, consistent with diverticular associated                            inflammation                           Non-bleeding internal hemorrhoids were found during                            retroflexion.                           The terminal ileum appeared normal.                           The exam was otherwise without abnormality. Complications:            No immediate complications. Estimated blood loss:                            Minimal. Estimated Blood Loss:     Estimated blood loss was minimal. Impression:               - One 8 mm polyp in the ascending colon, removed                            with a cold snare. Resected and retrieved.                            - Diverticulosis in the left colon which correlates                            to CT scan findings.                           - Non-bleeding internal hemorrhoids.                           - The examined portion of the ileum was normal.                           - The examination was otherwise normal.                           Overall, suspect diverticular disease is the likely                            cause of  the patient's intermitent symptoms Recommendation:           - Patient has a contact number available for                            emergencies. The signs and symptoms of potential                            delayed complications were discussed with the                            patient. Return to normal activities tomorrow.                            Written discharge instructions were provided to the                            patient.                           - Resume previous diet.                           - Recommend daily fiber supplement for                            diverticulosis                           - Continue present medications.                           - No aspirin, ibuprofen, naproxen, or other                            non-steroidal anti-inflammatory drugs for 2 weeks                            after polyp removal.                           - Await pathology results.                           - Repeat colonoscopy is recommended for                            surveillance. The colonoscopy date will be                            determined after pathology results from today's                            exam become available for review. Remo Lipps P. Armbruster, MD 10/31/2015 11:36:07 AM This report has been signed electronically.

## 2015-11-01 ENCOUNTER — Telehealth: Payer: Self-pay

## 2015-11-01 NOTE — Telephone Encounter (Signed)
  Follow up Call-  Call back number 10/31/2015  Post procedure Call Back phone  # [phone]  Permission to leave phone message Yes     Patient questions:  Do you have a fever, pain , or abdominal swelling? No. Pain Score  0 *  Have you tolerated food without any problems? Yes.    Have you been able to return to your normal activities? Yes.    Do you have any questions about your discharge instructions: Diet   No. Medications  No. Follow up visit  No.  Do you have questions or concerns about your Care? No.  Actions: * If pain score is 4 or above: No action needed, pain <4.

## 2015-11-05 ENCOUNTER — Encounter: Payer: Self-pay | Admitting: Gastroenterology

## 2015-11-07 ENCOUNTER — Encounter: Payer: Self-pay | Admitting: Family Medicine

## 2015-12-03 ENCOUNTER — Other Ambulatory Visit: Payer: Self-pay | Admitting: Family Medicine

## 2016-03-10 ENCOUNTER — Other Ambulatory Visit: Payer: Self-pay | Admitting: Family Medicine

## 2016-03-10 NOTE — Telephone Encounter (Signed)
Ok to refill? Last filled 07/29/15 #[id number] [phone]

## 2016-03-11 MED ORDER — ALPRAZOLAM 0.5 MG PO TABS: 0.5000 mg | ORAL_TABLET | Freq: Every day | ORAL | 0 refills | Status: DC

## 2016-03-11 NOTE — Telephone Encounter (Signed)
Rx called in as directed.   

## 2016-03-11 NOTE — Telephone Encounter (Signed)
plz phone in. 

## 2016-07-13 ENCOUNTER — Other Ambulatory Visit: Payer: Self-pay | Admitting: Family Medicine

## 2016-07-13 DIAGNOSIS — E785 Hyperlipidemia, unspecified: Secondary | ICD-10-CM

## 2016-07-13 DIAGNOSIS — E042 Nontoxic multinodular goiter: Secondary | ICD-10-CM

## 2016-07-17 ENCOUNTER — Other Ambulatory Visit (INDEPENDENT_AMBULATORY_CARE_PROVIDER_SITE_OTHER): Payer: BLUE CROSS/BLUE SHIELD

## 2016-07-17 DIAGNOSIS — R7989 Other specified abnormal findings of blood chemistry: Secondary | ICD-10-CM | POA: Diagnosis not present

## 2016-07-17 DIAGNOSIS — E042 Nontoxic multinodular goiter: Secondary | ICD-10-CM

## 2016-07-17 DIAGNOSIS — E785 Hyperlipidemia, unspecified: Secondary | ICD-10-CM | POA: Diagnosis not present

## 2016-07-17 LAB — COMPREHENSIVE METABOLIC PANEL
ALBUMIN: 4.6 g/dL (ref 3.5–5.2)
ALK PHOS: 49 U/L (ref 39–117)
ALT: 26 U/L (ref 0–53)
AST: 19 U/L (ref 0–37)
BILIRUBIN TOTAL: 0.5 mg/dL (ref 0.2–1.2)
BUN: 16 mg/dL (ref 6–23)
CO2: 28 mEq/L (ref 19–32)
Calcium: 9.7 mg/dL (ref 8.4–10.5)
Chloride: 102 mEq/L (ref 96–112)
Creatinine, Ser: 1.1 mg/dL (ref 0.40–1.50)
GFR: 76.91 mL/min (ref >60.00)
GLUCOSE: 95 mg/dL (ref 70–99)
POTASSIUM: 4.2 meq/L (ref 3.5–5.1)
Sodium: 138 mEq/L (ref 135–145)
TOTAL PROTEIN: 7.5 g/dL (ref 6.0–8.3)

## 2016-07-17 LAB — LIPID PANEL
Cholesterol: 155 mg/dL (ref 0–200)
HDL: 34.9 mg/dL — AB (ref >39.00)
NONHDL: 120.3
Total CHOL/HDL Ratio: 4
Triglycerides: 213 mg/dL — ABNORMAL HIGH (ref 0.0–149.0)
VLDL: 42.6 mg/dL — ABNORMAL HIGH (ref 0.0–40.0)

## 2016-07-17 LAB — LDL CHOLESTEROL, DIRECT: Direct LDL: 98 mg/dL

## 2016-07-17 LAB — TSH: TSH: 1.76 u[IU]/mL (ref 0.35–4.50)

## 2016-07-19 ENCOUNTER — Other Ambulatory Visit: Payer: Self-pay | Admitting: Family Medicine

## 2016-07-24 ENCOUNTER — Ambulatory Visit (INDEPENDENT_AMBULATORY_CARE_PROVIDER_SITE_OTHER): Payer: BLUE CROSS/BLUE SHIELD | Admitting: Family Medicine

## 2016-07-24 ENCOUNTER — Encounter: Payer: Self-pay | Admitting: Family Medicine

## 2016-07-24 VITALS — BP 114/80 | HR 60 | Temp 99.0°F | Ht 70.5 in | Wt 233.5 lb

## 2016-07-24 DIAGNOSIS — E785 Hyperlipidemia, unspecified: Secondary | ICD-10-CM | POA: Diagnosis not present

## 2016-07-24 DIAGNOSIS — K219 Gastro-esophageal reflux disease without esophagitis: Secondary | ICD-10-CM | POA: Diagnosis not present

## 2016-07-24 DIAGNOSIS — F419 Anxiety disorder, unspecified: Secondary | ICD-10-CM | POA: Diagnosis not present

## 2016-07-24 DIAGNOSIS — Z23 Encounter for immunization: Secondary | ICD-10-CM | POA: Diagnosis not present

## 2016-07-24 DIAGNOSIS — E669 Obesity, unspecified: Secondary | ICD-10-CM

## 2016-07-24 DIAGNOSIS — Z0000 Encounter for general adult medical examination without abnormal findings: Secondary | ICD-10-CM | POA: Diagnosis not present

## 2016-07-24 MED ORDER — OMEPRAZOLE 20 MG PO CPDR: 20.0000 mg | DELAYED_RELEASE_CAPSULE | Freq: Every day | ORAL | Status: DC | PRN

## 2016-07-24 NOTE — Assessment & Plan Note (Signed)
Chronic, stable. Continue low dose PPI - suggested trial QOD dosing.

## 2016-07-24 NOTE — Patient Instructions (Addendum)
Flu shot today Try omeprazole every other day.  You are doing well today. Work on regular exercise into routine - goal 150 min/wk of moderate intensity aerobic exercise.  Return as needed or in 1 year for next physical.  Health Maintenance, Male A healthy lifestyle and preventative care can promote health and wellness.  Maintain regular health, dental, and eye exams.  Eat a healthy diet. Foods like vegetables, fruits, whole grains, low-fat dairy products, and lean protein foods contain the nutrients you need and are low in calories. Decrease your intake of foods high in solid fats, added sugars, and salt. Get information about a proper diet from your health care provider, if necessary.  Regular physical exercise is one of the most important things you can do for your health. Most adults should get at least 150 minutes of moderate-intensity exercise (any activity that increases your heart rate and causes you to sweat) each week. In addition, most adults need muscle-strengthening exercises on 2 or more days a week.   Maintain a healthy weight. The body mass index (BMI) is a screening tool to identify possible weight problems. It provides an estimate of body fat based on height and weight. Your health care provider can find your BMI and can help you achieve or maintain a healthy weight. For males 20 years and older:  A BMI below 18.5 is considered underweight.  A BMI of 18.5 to 24.9 is normal.  A BMI of 25 to 29.9 is considered overweight.  A BMI of 30 and above is considered obese.  Maintain normal blood lipids and cholesterol by exercising and minimizing your intake of saturated fat. Eat a balanced diet with plenty of fruits and vegetables. Blood tests for lipids and cholesterol should begin at age 39 and be repeated every 5 years. If your lipid or cholesterol levels are high, you are over age 24, or you are at high risk for heart disease, you may need your cholesterol levels checked more  frequently.Ongoing high lipid and cholesterol levels should be treated with medicines if diet and exercise are not working.  If you smoke, find out from your health care provider how to quit. If you do not use tobacco, do not start.  Lung cancer screening is recommended for adults aged 55-80 years who are at high risk for developing lung cancer because of a history of smoking. A yearly low-dose CT scan of the lungs is recommended for people who have at least a 30-pack-year history of smoking and are current smokers or have quit within the past 15 years. A pack year of smoking is smoking an average of 1 pack of cigarettes a day for 1 year (for example, a 30-pack-year history of smoking could mean smoking 1 pack a day for 30 years or 2 packs a day for 15 years). Yearly screening should continue until the smoker has stopped smoking for at least 15 years. Yearly screening should be stopped for people who develop a health problem that would prevent them from having lung cancer treatment.  If you choose to drink alcohol, do not have more than 2 drinks per day. One drink is considered to be 12 oz (360 mL) of beer, 5 oz (150 mL) of wine, or 1.5 oz (45 mL) of liquor.  Avoid the use of street drugs. Do not share needles with anyone. Ask for help if you need support or instructions about stopping the use of drugs.  High blood pressure causes heart disease and increases the risk  of stroke. High blood pressure is more likely to develop in:  People who have blood pressure in the end of the normal range (100-139/85-89 mm Hg).  People who are overweight or obese.  People who are African American.  If you are 47-69 years of age, have your blood pressure checked every 3-5 years. If you are 42 years of age or older, have your blood pressure checked every year. You should have your blood pressure measured twice-once when you are at a hospital or clinic, and once when you are not at a hospital or clinic. Record the  average of the two measurements. To check your blood pressure when you are not at a hospital or clinic, you can use:  An automated blood pressure machine at a pharmacy.  A home blood pressure monitor.  If you are 50-38 years old, ask your health care provider if you should take aspirin to prevent heart disease.  Diabetes screening involves taking a blood sample to check your fasting blood sugar level. This should be done once every 3 years after age 31 if you are at a normal weight and without risk factors for diabetes. Testing should be considered at a younger age or be carried out more frequently if you are overweight and have at least 1 risk factor for diabetes.  Colorectal cancer can be detected and often prevented. Most routine colorectal cancer screening begins at the age of 34 and continues through age 71. However, your health care provider may recommend screening at an earlier age if you have risk factors for colon cancer. On a yearly basis, your health care provider may provide home test kits to check for hidden blood in the stool. A small camera at the end of a tube may be used to directly examine the colon (sigmoidoscopy or colonoscopy) to detect the earliest forms of colorectal cancer. Talk to your health care provider about this at age 68 when routine screening begins. A direct exam of the colon should be repeated every 5-10 years through age 26, unless early forms of precancerous polyps or small growths are found.  People who are at an increased risk for hepatitis B should be screened for this virus. You are considered at high risk for hepatitis B if:  You were born in a country where hepatitis B occurs often. Talk with your health care provider about which countries are considered high risk.  Your parents were born in a high-risk country and you have not received a shot to protect against hepatitis B (hepatitis B vaccine).  You have HIV or AIDS.  You use needles to inject street  drugs.  You live with, or have sex with, someone who has hepatitis B.  You are a man who has sex with other men (MSM).  You get hemodialysis treatment.  You take certain medicines for conditions like cancer, organ transplantation, and autoimmune conditions.  Hepatitis C blood testing is recommended for all people born from [birth year] through 1965 and any individual with known risk factors for hepatitis C.  Healthy men should no longer receive prostate-specific antigen (PSA) blood tests as part of routine cancer screening. Talk to your health care provider about prostate cancer screening.  Testicular cancer screening is not recommended for adolescents or adult males who have no symptoms. Screening includes self-exam, a health care provider exam, and other screening tests. Consult with your health care provider about any symptoms you have or any concerns you have about testicular cancer.  Practice safe sex.  Use condoms and avoid high-risk sexual practices to reduce the spread of sexually transmitted infections (STIs).  You should be screened for STIs, including gonorrhea and chlamydia if:  You are sexually active and are younger than 24 years.  You are older than 24 years, and your health care provider tells you that you are at risk for this type of infection.  Your sexual activity has changed since you were last screened, and you are at an increased risk for chlamydia or gonorrhea. Ask your health care provider if you are at risk.  If you are at risk of being infected with HIV, it is recommended that you take a prescription medicine daily to prevent HIV infection. This is called pre-exposure prophylaxis (PrEP). You are considered at risk if:  You are a man who has sex with other men (MSM).  You are a heterosexual man who is sexually active with multiple partners.  You take drugs by injection.  You are sexually active with a partner who has HIV.  Talk with your health care provider about  whether you are at high risk of being infected with HIV. If you choose to begin PrEP, you should first be tested for HIV. You should then be tested every 3 months for as long as you are taking PrEP.  Use sunscreen. Apply sunscreen liberally and repeatedly throughout the day. You should seek shade when your shadow is shorter than you. Protect yourself by wearing long sleeves, pants, a wide-brimmed hat, and sunglasses year round whenever you are outdoors.  Tell your health care provider of new moles or changes in moles, especially if there is a change in shape or color. Also, tell your health care provider if a mole is larger than the size of a pencil eraser.  A one-time screening for abdominal aortic aneurysm (AAA) and surgical repair of large AAAs by ultrasound is recommended for men aged 29-75 years who are current or former smokers.  Stay current with your vaccines (immunizations). This information is not intended to replace advice given to you by your health care provider. Make sure you discuss any questions you have with your health care provider. Document Released: 12/26/2007 Document Revised: 07/20/2014 Document Reviewed: 04/02/2015 Elsevier Interactive Patient Education  2017 [employer].

## 2016-07-24 NOTE — Progress Notes (Signed)
BP 114/80   Pulse 60   Temp 99 F (37.2 C) (Oral)   Ht 5' 10.5" (1.791 m)   Wt 233 lb 8 oz (105.9 kg)   BMI 33.03 kg/m    CC: CPE Subjective:    Patient ID: Erik Cole, male    DOB: 10/06/1970, 46 y.o.   MRN: [id number]  HPI: Erik Cole is a 46 y.o. male presenting on 07/24/2016 for Annual Exam   Rare xanax for anxiety.  Recurrent diverticulitis - managing with increased benefiber and miralax.   Preventative: COLONOSCOPY 10/2015 SSA, diverticulosis, rpt 5 yrs ([facility]) Tetanus done 10/2009 Flu shot yearly Seat belt use discussed  Sunscreen use discussed. No changing moles on skin. Non smoker Alcohol - rare  Caffeine: occasionally.  Lives with wife [name]), 2 children (son and daughter), 1 cat  Occupation: Radiation protection practitioner at [employer] Activity: no regular exercise Diet: good water, daily fruits/vegetables, red meat rarely, fish 1x/wk, cut out fast food   Relevant past medical, surgical, family and social history reviewed and updated as indicated. Interim medical history since our last visit reviewed. Allergies and medications reviewed and updated. Current Outpatient Prescriptions on File Prior to Visit  Medication Sig  . ALPRAZolam (XANAX) 0.5 MG tablet Take 1 tablet (0.5 mg total) by mouth daily.  Marland Kitchen atorvastatin (LIPITOR) 10 MG tablet TAKE 1 TABLET DAILY  . cetirizine (ZYRTEC) 10 MG tablet Take 10 mg by mouth as needed for allergies. Reported on 10/31/2015  . fenofibrate micronized (ANTARA) 130 MG capsule Take 1 capsule (130 mg total) by mouth daily.   No current facility-administered medications on file prior to visit.     Review of Systems  Constitutional: Negative for activity change, appetite change, chills, fatigue, fever and unexpected weight change.  HENT: Negative for hearing loss.   Eyes: Negative for visual disturbance.  Respiratory: Negative for cough, chest tightness, shortness of breath and wheezing.   Cardiovascular: Negative for  chest pain, palpitations and leg swelling.  Gastrointestinal: Negative for abdominal distention, abdominal pain, blood in stool, constipation, diarrhea, nausea and vomiting.  Genitourinary: Negative for difficulty urinating and hematuria.  Musculoskeletal: Negative for arthralgias, myalgias and neck pain.  Skin: Negative for rash.  Neurological: Negative for dizziness, seizures, syncope and headaches.  Hematological: Negative for adenopathy. Does not bruise/bleed easily.  Psychiatric/Behavioral: Negative for dysphoric mood. The patient is not nervous/anxious.    Per HPI unless specifically indicated in ROS section     Objective:    BP 114/80   Pulse 60   Temp 99 F (37.2 C) (Oral)   Ht 5' 10.5" (1.791 m)   Wt 233 lb 8 oz (105.9 kg)   BMI 33.03 kg/m   Wt Readings from Last 3 Encounters:  07/24/16 233 lb 8 oz (105.9 kg)  10/31/15 234 lb (106.1 kg)  10/21/15 234 lb (106.1 kg)    Physical Exam  Constitutional: He is oriented to person, place, and time. He appears well-developed and well-nourished. No distress.  HENT:  Head: Normocephalic and atraumatic.  Right Ear: Hearing, tympanic membrane, external ear and ear canal normal.  Left Ear: Hearing, tympanic membrane, external ear and ear canal normal.  Nose: Nose normal.  Mouth/Throat: Uvula is midline, oropharynx is clear and moist and mucous membranes are normal. No oropharyngeal exudate, posterior oropharyngeal edema or posterior oropharyngeal erythema.  Eyes: Conjunctivae and EOM are normal. Pupils are equal, round, and reactive to light. No scleral icterus.  Neck: Normal range of motion. Neck supple. No thyromegaly present.  Cardiovascular:  Normal rate, regular rhythm, normal heart sounds and intact distal pulses.   No murmur heard. Pulses:      Radial pulses are 2+ on the right side, and 2+ on the left side.  Pulmonary/Chest: Effort normal and breath sounds normal. No respiratory distress. He has no wheezes. He has no rales.    Abdominal: Soft. Bowel sounds are normal. He exhibits no distension and no mass. There is no tenderness. There is no rebound and no guarding.  Musculoskeletal: Normal range of motion. He exhibits no edema.  Lymphadenopathy:    He has no cervical adenopathy.  Neurological: He is alert and oriented to person, place, and time.  CN grossly intact, station and gait intact  Skin: Skin is warm and dry. No rash noted.  Psychiatric: He has a normal mood and affect. His behavior is normal. Judgment and thought content normal.  Nursing note and vitals reviewed.  Results for orders placed or performed in visit on 07/17/16  Lipid panel  Result Value Ref Range   Cholesterol 155 0 - 200 mg/dL   Triglycerides 213.0 (H) 0.0 - 149.0 mg/dL   HDL 34.90 (L) >39.00 mg/dL   VLDL 42.6 (H) 0.0 - 40.0 mg/dL   Total CHOL/HDL Ratio 4    NonHDL 120.30   Comprehensive metabolic panel  Result Value Ref Range   Sodium 138 135 - 145 mEq/L   Potassium 4.2 3.5 - 5.1 mEq/L   Chloride 102 96 - 112 mEq/L   CO2 28 19 - 32 mEq/L   Glucose, Bld 95 70 - 99 mg/dL   BUN 16 6 - 23 mg/dL   Creatinine, Ser 1.10 0.40 - 1.50 mg/dL   Total Bilirubin 0.5 0.2 - 1.2 mg/dL   Alkaline Phosphatase 49 39 - 117 U/L   AST 19 0 - 37 U/L   ALT 26 0 - 53 U/L   Total Protein 7.5 6.0 - 8.3 g/dL   Albumin 4.6 3.5 - 5.2 g/dL   Calcium 9.7 8.4 - 10.5 mg/dL   GFR 76.91 >60.00 mL/min  TSH  Result Value Ref Range   TSH 1.76 0.35 - 4.50 uIU/mL  LDL cholesterol, direct  Result Value Ref Range   Direct LDL 98.0 mg/dL      Assessment & Plan:   Problem List Items Addressed This Visit    Anxiety, mild    Controlled with rare xanax. #30 last filled 02/2016 (about once or twice a year).      Dyslipidemia    Chronic, stable. Continue lipitor and fenofibrate. Pt tolerating well. No transaminitis.       GERD (gastroesophageal reflux disease)    Chronic, stable. Continue low dose PPI - suggested trial QOD dosing.       Relevant  Medications   omeprazole (PRILOSEC) 20 MG capsule   Healthcare maintenance - Primary    Preventative protocols reviewed and updated unless pt declined. Discussed healthy diet and lifestyle.       Obesity, Class I, BMI 30-34.9    Discussed healthy diet and lifestyle changes to affect sustainable weight loss.       Other Visit Diagnoses    Need for influenza vaccination       Relevant Orders   Flu Vaccine QUAD 36+ mos PF IM (Fluarix & Fluzone Quad PF) (Completed)       Follow up plan: Return in about 1 year (around 07/24/2017) for annual exam, prior fasting for blood work.  Ria Bush, MD

## 2016-07-24 NOTE — Assessment & Plan Note (Signed)
Chronic, stable. Continue lipitor and fenofibrate. Pt tolerating well. No transaminitis.

## 2016-07-24 NOTE — Assessment & Plan Note (Signed)
Discussed healthy diet and lifestyle changes to affect sustainable weight loss  

## 2016-07-24 NOTE — Assessment & Plan Note (Addendum)
Controlled with rare xanax. #30 last filled 02/2016 (about once or twice a year).

## 2016-07-24 NOTE — Assessment & Plan Note (Signed)
Preventative protocols reviewed and updated unless pt declined. Discussed healthy diet and lifestyle.  

## 2016-07-24 NOTE — Progress Notes (Signed)
Pre visit review using our clinic review tool, if applicable. No additional management support is needed unless otherwise documented below in the visit note. 

## 2016-09-28 ENCOUNTER — Other Ambulatory Visit: Payer: Self-pay | Admitting: Family Medicine

## 2016-09-28 NOTE — Telephone Encounter (Signed)
Ok to refill? Last filled 03/11/16 #30 0RF

## 2016-09-29 MED ORDER — ALPRAZOLAM 0.5 MG PO TABS: 0.5000 mg | ORAL_TABLET | Freq: Every day | ORAL | 0 refills | Status: DC

## 2016-09-29 MED ORDER — ATORVASTATIN CALCIUM 10 MG PO TABS: 10.0000 mg | ORAL_TABLET | Freq: Every day | ORAL | 3 refills | Status: DC

## 2016-09-29 NOTE — Telephone Encounter (Signed)
Rx called in as directed.   

## 2016-09-29 NOTE — Telephone Encounter (Signed)
plz phone in. 

## 2016-11-01 ENCOUNTER — Other Ambulatory Visit: Payer: Self-pay | Admitting: Family Medicine

## 2017-01-01 ENCOUNTER — Encounter: Payer: Self-pay | Admitting: Family Medicine

## 2017-02-06 ENCOUNTER — Other Ambulatory Visit: Payer: Self-pay | Admitting: Family Medicine

## 2017-02-08 NOTE — Telephone Encounter (Signed)
Last Rx 09/2016. Last OV 07/2016-CPE

## 2017-02-09 MED ORDER — ALPRAZOLAM 0.5 MG PO TABS: 0.5000 mg | ORAL_TABLET | Freq: Every day | ORAL | 0 refills | Status: DC

## 2017-02-09 NOTE — Telephone Encounter (Signed)
plz phone in. 

## 2017-02-09 NOTE — Telephone Encounter (Signed)
Rx called in to requested pharmacy 

## 2017-02-21 IMAGING — US US SCROTUM W/ DOPPLER COMPLETE
1 series · 14 of 25 positions shown · non-contrast
Comparison: None.

CLINICAL DATA: Left testicular pain

EXAM:
SCROTAL ULTRASOUND
DOPPLER ULTRASOUND OF THE TESTICLES
TECHNIQUE: Complete ultrasound examination of the testicles, epididymis, and
other scrotal structures was performed. Color and spectral Doppler
ultrasound were also utilized to evaluate blood flow to the
testicles.

[Series 1: us scrotum w/ doppler complete · 0.07mm/px · 14 of 71 slices shown]
[im 1/71]
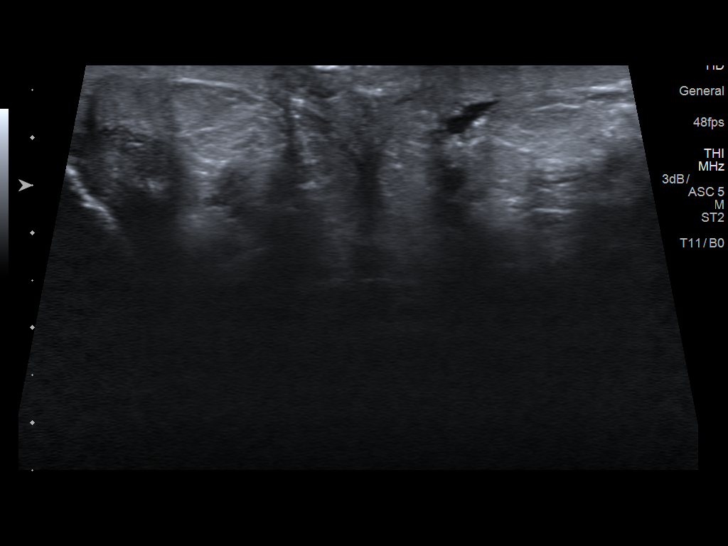
[im 6/71]
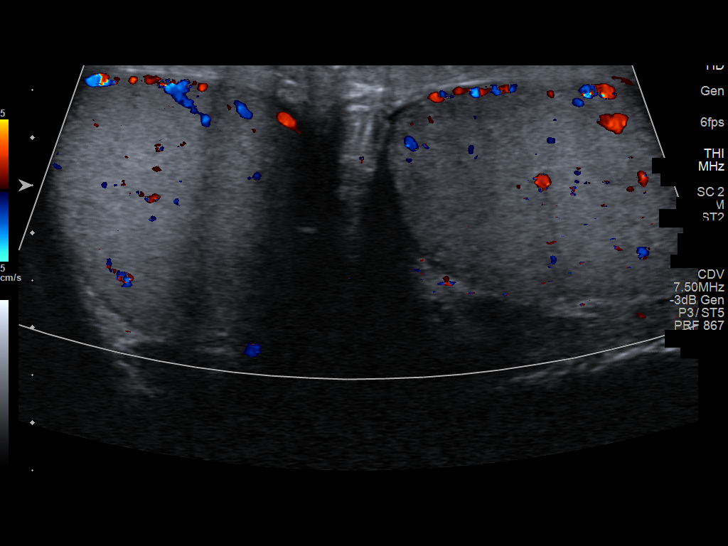
[im 12/71]
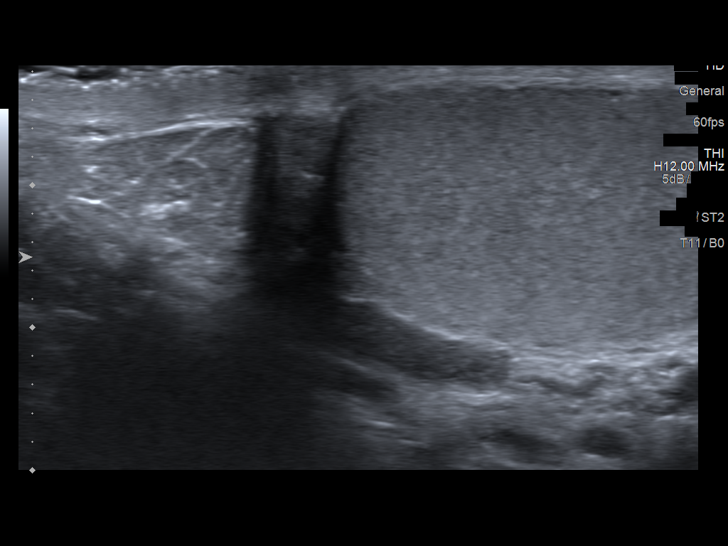
[im 18/71]
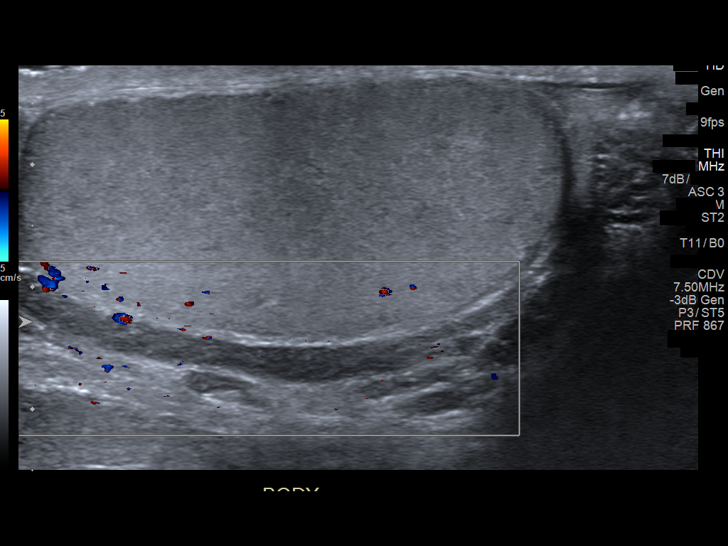
[im 24/71]
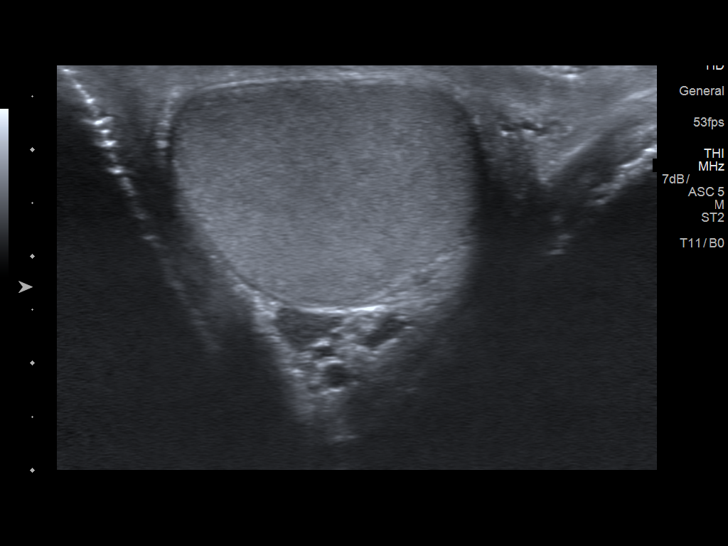
[im 27/71]
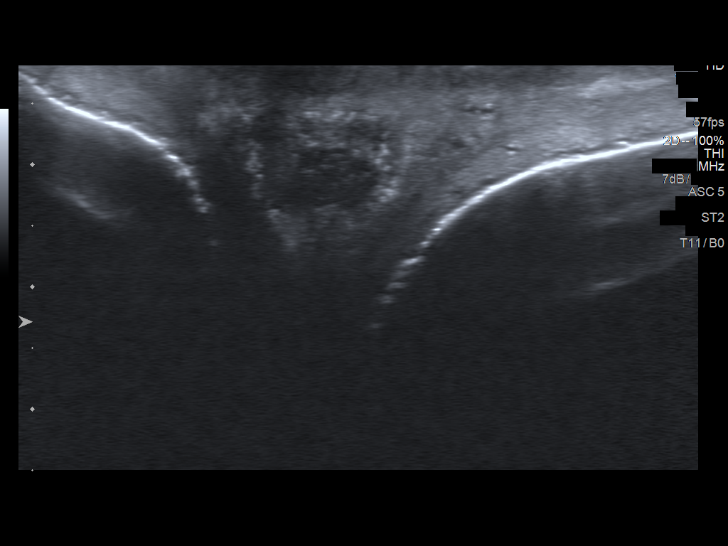
[im 33/71]
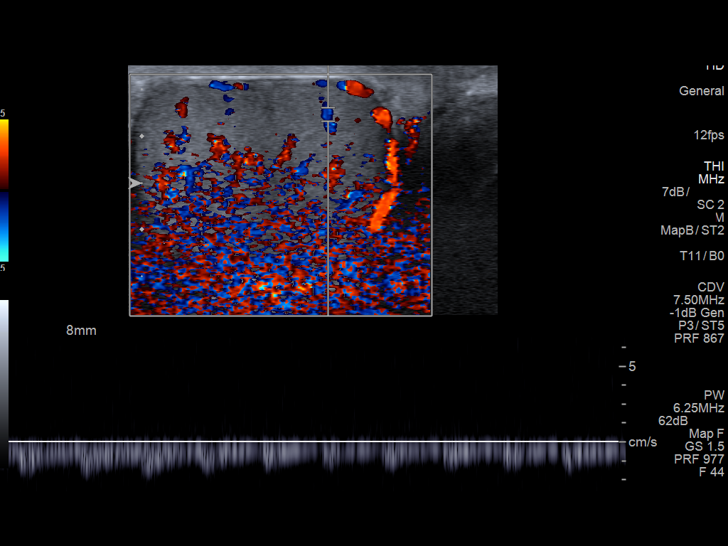
[im 38/71]
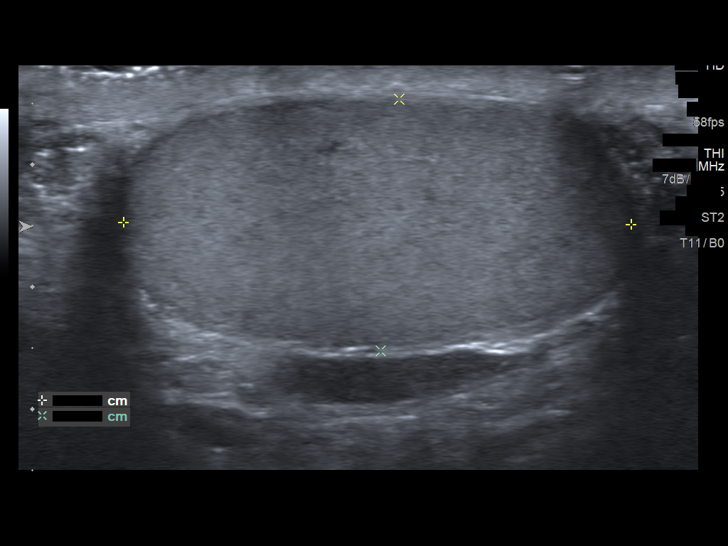
[im 44/71]
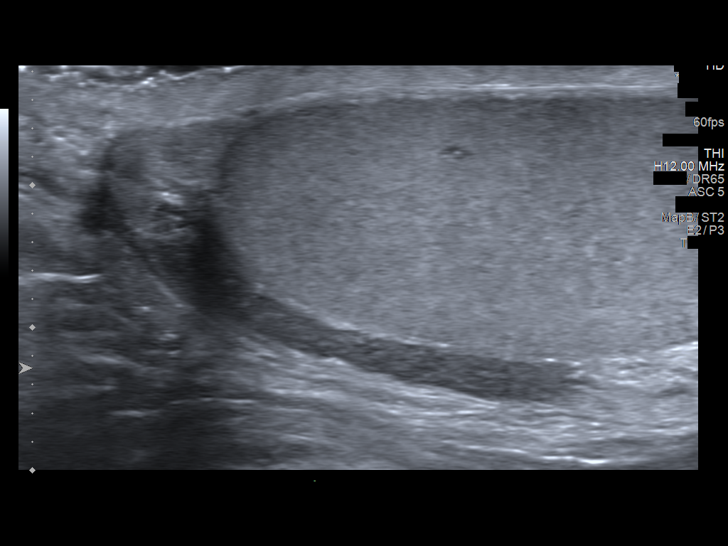
[im 47/71]
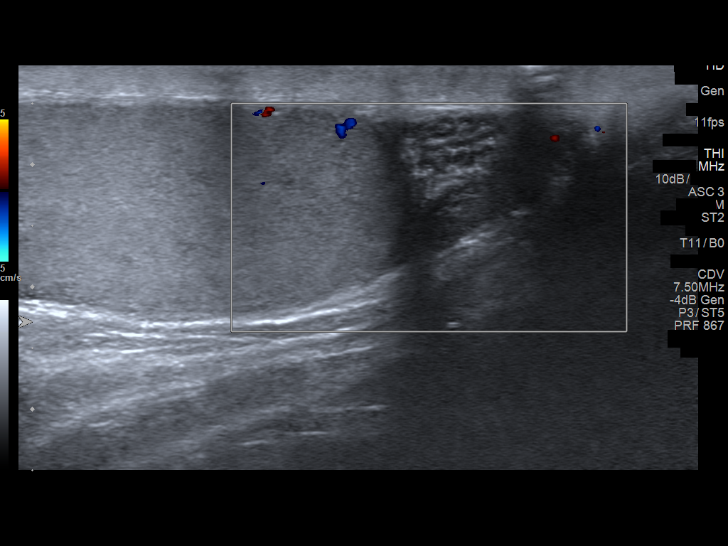
[im 53/71]
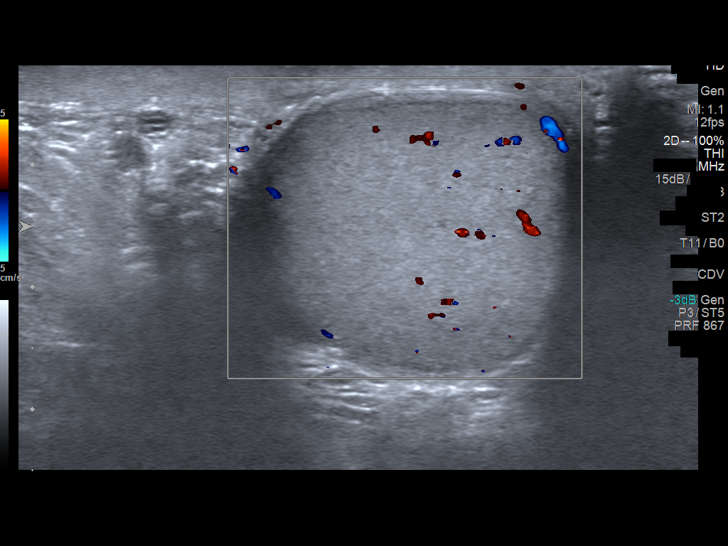
[im 59/71]
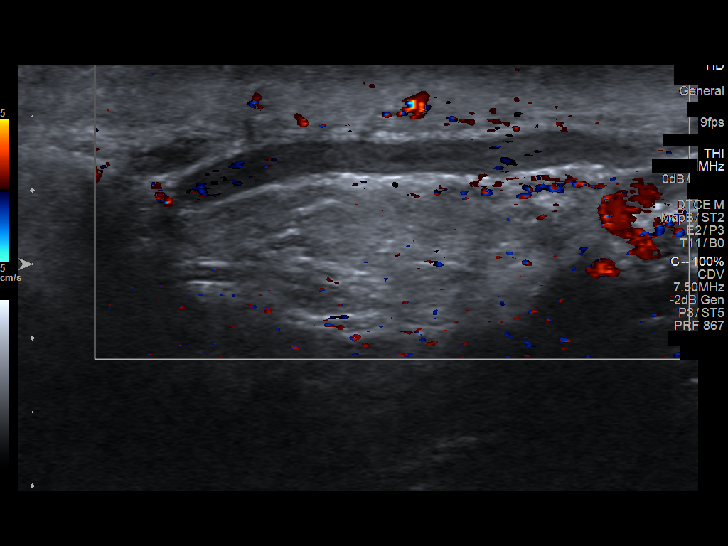
[im 65/71]
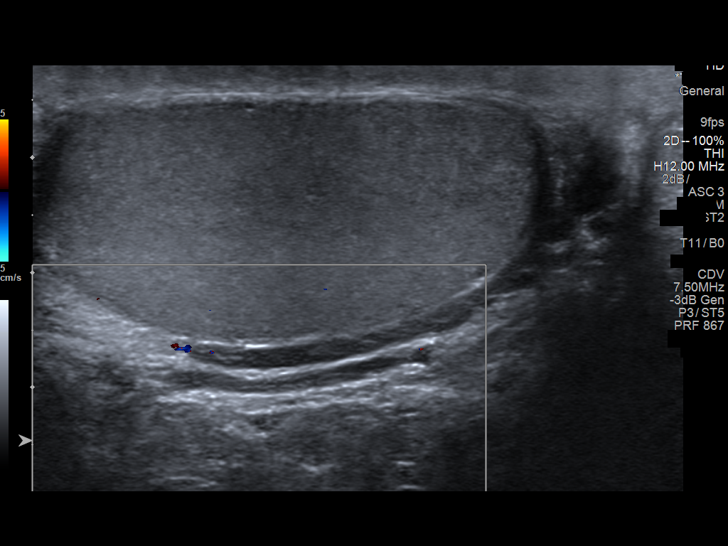
[im 71/71]
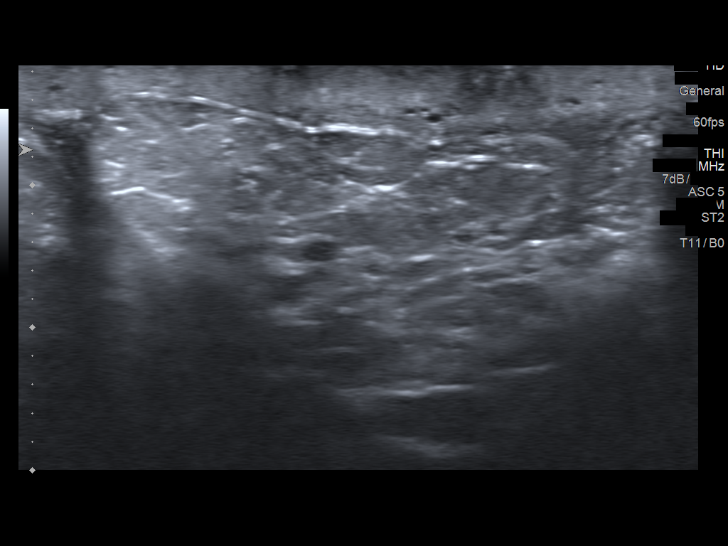

[14 of 25 positions shown; findings below may reference images not displayed]

FINDINGS: Right testicle

Measurements: 4.2 x 2.3 x 2.7 cm. No mass or microlithiasis
visualized.

Left testicle

Measurements: 4.2 x 2.1 x 2.6 cm. No mass or microlithiasis
visualized.

Right epididymis:  Tiny 2 mm cyst in the epididymal head

Left epididymis:  Tiny 3 mm cyst in the epididymal head

Hydrocele:  None visualized.

Varicocele:  None visualized.

Pulsed Doppler interrogation of both testes demonstrates normal low
resistance arterial and venous waveforms bilaterally.
IMPRESSION: No focal testicular abnormality or evidence of torsion. Small
bilateral epididymal head cysts. No acute findings.

## 2017-02-22 ENCOUNTER — Encounter: Payer: Self-pay | Admitting: Family Medicine

## 2017-02-23 ENCOUNTER — Telehealth: Payer: Self-pay

## 2017-02-23 MED ORDER — ALPRAZOLAM 0.5 MG PO TABS: 0.5000 mg | ORAL_TABLET | Freq: Every day | ORAL | 0 refills | Status: DC

## 2017-02-23 NOTE — Telephone Encounter (Signed)
Landon at [facility] left v/m that the xanax that was called to [facility] on 02/09/17 was not supposed to be called to [facility] and xanax refill should be called to [place] at [phone]. I called [facility] and left v/m  to inactivate xanax rx and called refill for xanax to [facility] at [place].

## 2017-05-05 ENCOUNTER — Telehealth: Payer: Self-pay | Admitting: Family Medicine

## 2017-05-05 NOTE — Telephone Encounter (Signed)
Ok to do - pleasant patient, recently moved I believe to [place]. Rare xanax use for anxiety.

## 2017-05-05 NOTE — Telephone Encounter (Signed)
Patient would like to transfer from Ria Bush, MD to [facility] due to location, please advise

## 2017-05-06 NOTE — Telephone Encounter (Signed)
He can switch to me but make switch when he actually come in and not before. He can come in for get to know appointment now and maybe get flu vaccine if he has not had or come in January for CPE.

## 2017-05-06 NOTE — Telephone Encounter (Signed)
Patient scheduled with Percell Miller for 07/28/2017

## 2017-05-26 ENCOUNTER — Other Ambulatory Visit: Payer: Self-pay | Admitting: Family Medicine

## 2017-07-16 ENCOUNTER — Ambulatory Visit: Payer: BLUE CROSS/BLUE SHIELD | Admitting: Medical

## 2017-07-16 ENCOUNTER — Ambulatory Visit: Payer: Self-pay | Admitting: Medical

## 2017-07-21 ENCOUNTER — Encounter: Payer: Self-pay | Admitting: Medical

## 2017-07-21 ENCOUNTER — Other Ambulatory Visit: Payer: Self-pay | Admitting: Medical

## 2017-07-21 ENCOUNTER — Ambulatory Visit (INDEPENDENT_AMBULATORY_CARE_PROVIDER_SITE_OTHER): Payer: BLUE CROSS/BLUE SHIELD | Admitting: Medical

## 2017-07-21 ENCOUNTER — Ambulatory Visit (HOSPITAL_BASED_OUTPATIENT_CLINIC_OR_DEPARTMENT_OTHER)
Admission: RE | Admit: 2017-07-21 | Discharge: 2017-07-21 | Disposition: A | Discharge status: Home / Self Care | Payer: BLUE CROSS/BLUE SHIELD | Source: Ambulatory Visit | Attending: Medical | Admitting: Medical

## 2017-07-21 ENCOUNTER — Other Ambulatory Visit: Payer: BLUE CROSS/BLUE SHIELD

## 2017-07-21 ENCOUNTER — Telehealth: Payer: Self-pay | Admitting: Medical

## 2017-07-21 VITALS — BP 113/81 | HR 78 | Temp 98.1°F | Resp 16 | Ht 70.0 in | Wt 221.0 lb

## 2017-07-21 DIAGNOSIS — Z0000 Encounter for general adult medical examination without abnormal findings: Secondary | ICD-10-CM

## 2017-07-21 DIAGNOSIS — Z23 Encounter for immunization: Secondary | ICD-10-CM | POA: Diagnosis not present

## 2017-07-21 DIAGNOSIS — N50819 Testicular pain, unspecified: Secondary | ICD-10-CM

## 2017-07-21 DIAGNOSIS — Z1283 Encounter for screening for malignant neoplasm of skin: Secondary | ICD-10-CM

## 2017-07-21 DIAGNOSIS — N503 Cyst of epididymis: Secondary | ICD-10-CM | POA: Diagnosis not present

## 2017-07-21 DIAGNOSIS — Z113 Encounter for screening for infections with a predominantly sexual mode of transmission: Secondary | ICD-10-CM

## 2017-07-21 LAB — COMPREHENSIVE METABOLIC PANEL
ALK PHOS: 47 U/L (ref 39–117)
ALT: 19 U/L (ref 0–53)
AST: 18 U/L (ref 0–37)
Albumin: 4.8 g/dL (ref 3.5–5.2)
BILIRUBIN TOTAL: 0.5 mg/dL (ref 0.2–1.2)
BUN: 13 mg/dL (ref 6–23)
CALCIUM: 9.6 mg/dL (ref 8.4–10.5)
CO2: 26 mEq/L (ref 19–32)
CREATININE: 1 mg/dL (ref 0.40–1.50)
Chloride: 103 mEq/L (ref 96–112)
GFR: 85.47 mL/min (ref >60.00)
Glucose, Bld: 100 mg/dL — ABNORMAL HIGH (ref 70–99)
Potassium: 4 mEq/L (ref 3.5–5.1)
Sodium: 139 mEq/L (ref 135–145)
TOTAL PROTEIN: 7.4 g/dL (ref 6.0–8.3)

## 2017-07-21 LAB — CBC
HCT: 44.2 % (ref 39.0–52.0)
HEMOGLOBIN: 15 g/dL (ref 13.0–17.0)
MCHC: 34 g/dL (ref 30.0–36.0)
MCV: 84.3 fl (ref 78.0–100.0)
PLATELETS: 339 10*3/uL (ref 150.0–400.0)
RBC: 5.25 Mil/uL (ref 4.22–5.81)
RDW: 13.4 % (ref 11.5–15.5)
WBC: 5.9 10*3/uL (ref 4.0–10.5)

## 2017-07-21 LAB — LIPID PANEL
CHOLESTEROL: 159 mg/dL (ref 0–200)
HDL: 38.5 mg/dL — AB (ref >39.00)
LDL Cholesterol: 87 mg/dL (ref 0–99)
NONHDL: 120.31
Total CHOL/HDL Ratio: 4
Triglycerides: 165 mg/dL — ABNORMAL HIGH (ref 0.0–149.0)
VLDL: 33 mg/dL (ref 0.0–40.0)

## 2017-07-21 LAB — POC URINALSYSI DIPSTICK (AUTOMATED)
BILIRUBIN UA: NEGATIVE
Blood, UA: NEGATIVE
Glucose, UA: NEGATIVE
KETONES UA: NEGATIVE
LEUKOCYTES UA: NEGATIVE
NITRITE UA: NEGATIVE
PH UA: 6 (ref 5.0–8.0)
PROTEIN UA: NEGATIVE
Spec Grav, UA: 1.01 (ref 1.010–1.025)
Urobilinogen, UA: NEGATIVE E.U./dL — AB

## 2017-07-21 MED ORDER — CIPROFLOXACIN HCL 500 MG PO TABS: 500.0000 mg | ORAL_TABLET | Freq: Two times a day (BID) | ORAL | 0 refills | Status: DC

## 2017-07-21 NOTE — Addendum Note (Signed)
Addended by: Hinton Dyer on: 07/21/2017 10:05 AM   Modules accepted: Orders

## 2017-07-21 NOTE — Patient Instructions (Addendum)
For you wellness exam today I have ordered cbc, cmp,  lipid panel, ua and hiv.  Flu vaccine given today  Recommend exercise and healthy diet.  We will let you know lab results as they come in.  Follow up date appointment will be determined after lab review.   Ultrasound of your scrotum will be done today.  You can go down now after our visit.  After I get the ultrasound results back might prescribe Cipro antibiotic and/or refer you to urologist.  I am also going to refer you to dermatologist for skin surveillance exam.    Preventive Care 40-64 Years, Male Preventive care refers to lifestyle choices and visits with your health care provider that can promote health and wellness. What does preventive care include?  A yearly physical exam. This is also called an annual well check.  Dental exams once or twice a year.  Routine eye exams. Ask your health care provider how often you should have your eyes checked.  Personal lifestyle choices, including: ? Daily care of your teeth and gums. ? Regular physical activity. ? Eating a healthy diet. ? Avoiding tobacco and drug use. ? Limiting alcohol use. ? Practicing safe sex. ? Taking low-dose aspirin every day starting at age 54. What happens during an annual well check? The services and screenings done by your health care provider during your annual well check will depend on your age, overall health, lifestyle risk factors, and family history of disease. Counseling Your health care provider may ask you questions about your:  Alcohol use.  Tobacco use.  Drug use.  Emotional well-being.  Home and relationship well-being.  Sexual activity.  Eating habits.  Work and work Statistician.  Screening You may have the following tests or measurements:  Height, weight, and BMI.  Blood pressure.  Lipid and cholesterol levels. These may be checked every 5 years, or more frequently if you are over 35 years old.  Skin  check.  Lung cancer screening. You may have this screening every year starting at age 20 if you have a 30-pack-year history of smoking and currently smoke or have quit within the past 15 years.  Fecal occult blood test (FOBT) of the stool. You may have this test every year starting at age 25.  Flexible sigmoidoscopy or colonoscopy. You may have a sigmoidoscopy every 5 years or a colonoscopy every 10 years starting at age 22.  Prostate cancer screening. Recommendations will vary depending on your family history and other risks.  Hepatitis C blood test.  Hepatitis B blood test.  Sexually transmitted disease (STD) testing.  Diabetes screening. This is done by checking your blood sugar (glucose) after you have not eaten for a while (fasting). You may have this done every 1-3 years.  Discuss your test results, treatment options, and if necessary, the need for more tests with your health care provider. Vaccines Your health care provider may recommend certain vaccines, such as:  Influenza vaccine. This is recommended every year.  Tetanus, diphtheria, and acellular pertussis (Tdap, Td) vaccine. You may need a Td booster every 10 years.  Varicella vaccine. You may need this if you have not been vaccinated.  Zoster vaccine. You may need this after age 57.  Measles, mumps, and rubella (MMR) vaccine. You may need at least one dose of MMR if you were born in 1957 or later. You may also need a second dose.  Pneumococcal 13-valent conjugate (PCV13) vaccine. You may need this if you have certain conditions and  have not been vaccinated.  Pneumococcal polysaccharide (PPSV23) vaccine. You may need one or two doses if you smoke cigarettes or if you have certain conditions.  Meningococcal vaccine. You may need this if you have certain conditions.  Hepatitis A vaccine. You may need this if you have certain conditions or if you travel or work in places where you may be exposed to hepatitis  A.  Hepatitis B vaccine. You may need this if you have certain conditions or if you travel or work in places where you may be exposed to hepatitis B.  Haemophilus influenzae type b (Hib) vaccine. You may need this if you have certain risk factors.  Talk to your health care provider about which screenings and vaccines you need and how often you need them. This information is not intended to replace advice given to you by your health care provider. Make sure you discuss any questions you have with your health care provider. Document Released: 07/26/2015 Document Revised: 03/18/2016 Document Reviewed: 04/30/2015 Elsevier Interactive Patient Education  [employer].

## 2017-07-21 NOTE — Progress Notes (Signed)
Subjective:    Patient ID: Erik Cole, male    DOB: 1971/03/01, 47 y.o.   MRN: [id number]  HPI    Pt in for first time today.  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 Pt states diet fairly healthy. Married- 2 children.(75 yo and 13 yo).  Pt did not get flu vaccine.  Prior colonoscopy post diverticulitis.  Pt states end of December felt small lump on his testicle that is mild tender. Small lump size of pellet of bb.  Pt is fasting today.   Review of Systems  Constitutional: Negative for chills, fatigue and fever.  Respiratory: Negative for cough, chest tightness, shortness of breath and wheezing.   Cardiovascular: Negative for chest pain and palpitations.  Gastrointestinal: Negative for abdominal pain, blood in stool, constipation, diarrhea and vomiting.       Pt has concern for diverticulitis based on his faint llq pain associated with mild testicle pain.  Musculoskeletal: Negative for arthralgias, back pain and gait problem.  Skin: Negative for rash.  Neurological: Negative for dizziness, syncope, speech difficulty, weakness, numbness and headaches.  Hematological: Negative for adenopathy. Does not bruise/bleed easily.  Psychiatric/Behavioral: Negative for behavioral problems, confusion, dysphoric mood, self-injury and suicidal ideas. The patient is not nervous/anxious.     Past Medical History:  Diagnosis Date  . Anxiety    mild, xanax sparingly  . GERD (gastroesophageal reflux disease)   . History of chicken pox   . HLD (hyperlipidemia)    previous PCP stopped tricor 2/2 elevated LFTs 07/2010  . 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 Obesity   . Transaminitis    hx mild, thought fibrate related - w/u ~2008 neg HbsAg, Hep C, ANA, nl iron/ferritin, ceruloplasmin, a1-antitrypsin, normal [place]     Social History   Socioeconomic History  . Marital status: Married    Spouse name: Not on file  . Number of children: 2  .  Years of education: college  . Highest education level: Not on file  Social Needs  . Financial resource strain: Not on file  . Food insecurity - worry: Not on file  . Food insecurity - inability: Not on file  . Transportation needs - medical: Not on file  . Transportation needs - non-medical: Not on file  Occupational History  . Occupation: Personal assistant: [employer]    Comment: [employer]  Tobacco Use  . Smoking status: Never Smoker  . Smokeless tobacco: Never Used  Substance and Sexual Activity  . Alcohol use: Yes    Alcohol/week: 0.0 oz    Comment: Rare  . Drug use: No  . Sexual activity: Not on file  Other Topics Concern  . Not on file  Social History Narrative   Caffeine: occasionally.   Lives with wife [name]), 2 children (son and daughter), 1 cat   Occupation:    Activity: no regular exercise   Diet: good water, daily fruits/vegetables, red meat rarely, fish 1x/wk, cut out fast food    Past Surgical History:  Procedure Laterality Date  . COLONOSCOPY  10/2015   one polyp, diverticulosis, rpt 5 yrs ([facility])  . GANGLION CYST EXCISION Right 2008    wrist    Family History  Problem Relation Age of Onset  . Hypertension Mother   . Hyperlipidemia Father        TG  . Diverticulitis Father   . Diabetes Maternal Grandmother   . Coronary artery disease Paternal Grandfather 24  unhealthy  . Breast cancer Other 33       great grandmother, maternal  . Stroke Neg Hx   . Colon cancer Neg Hx   . Colon polyps Neg Hx     Allergies  Allergen Reactions  . Tricor [Fenofibrate] Other (See Comments)    transaminitis    Current Outpatient Medications on File Prior to Visit  Medication Sig Dispense Refill  . ALPRAZolam (XANAX) 0.5 MG tablet Take 1 tablet (0.5 mg total) by mouth daily. 30 tablet 0  . atorvastatin (LIPITOR) 10 MG tablet Take 1 tablet (10 mg total) by mouth daily. 90 tablet 3  . cetirizine (ZYRTEC) 10 MG tablet Take 10 mg by mouth as needed  for allergies. Reported on 10/31/2015    . fenofibrate micronized (ANTARA) 130 MG capsule Take 1 capsule (130 mg total) by mouth daily. 90 capsule 3  . fenofibrate micronized (ANTARA) 130 MG capsule TAKE 1 CAPSULE DAILY 90 capsule 0  . omeprazole (PRILOSEC) 20 MG capsule Take 1 capsule (20 mg total) by mouth daily as needed.     No current facility-administered medications on file prior to visit.     BP 113/81   Pulse 78   Temp 98.1 F (36.7 C) (Oral)   Resp 16   Ht 5\' 10"  (1.778 m)   Wt 221 lb (100.2 kg)   SpO2 99%   BMI 31.71 kg/m       Objective:   Physical Exam   General Mental Status- Alert. General Appearance- Not in acute distress.   Skin General: Color- Normal Color. Moisture- Normal Moisture. Scattered many small to moderate sized large moles.   Neck Carotid Arteries- Normal color. Moisture- Normal Moisture. No carotid bruits. No JVD.  Chest and Lung Exam Auscultation: Breath Sounds:-Normal.  Cardiovascular Auscultation:Rythm- Regular. Murmurs & Other Heart Sounds:Auscultation of the heart reveals- No Murmurs.  Abdomen Inspection:-Inspeection Normal. Palpation/Percussion:Note:No mass. Palpation and Percussion of the abdomen reveal- Non Tender, Non Distended + BS, no rebound or guarding.    Neurologic Cranial Nerve exam:- CN III-XII intact(No nystagmus), symmetric smile. Strength:- 5/5 equal and symmetric strength both upper and lower extremities.   Genital exam- left side testicle epididymus mild tender. No obvious mass. Rt side testicle normal. Inguinal canals clear from any hernia.    Assessment & Plan:  For you wellness exam today I have ordered cbc, cmp,  lipid panel, ua and hiv.  Flu vaccine given today  Recommend exercise and healthy diet.  We will let you know lab results as they come in.  Follow up date appointment will be determined after lab review.   Ultrasound of your scrotum will be done today.  You can go down now after our  visit.  After I get the ultrasound results back might prescribe Cipro antibiotic and/or refer you to urologist.  I am also going to refer you to dermatologist for skin surveillance exam.  Brielle Moro, Percell Miller, PA-C

## 2017-07-21 NOTE — Telephone Encounter (Signed)
I did send Cipro prescription to [facility] [address].  Mail order pharmacy for mail order before starting.  Double check and make sure he is aware.  I  chose the one that thought might be closer to his house.

## 2017-07-22 LAB — HIV ANTIBODY (ROUTINE TESTING W REFLEX): HIV 1&2 Ab, 4th Generation: NONREACTIVE

## 2017-07-22 MED ORDER — ALPRAZOLAM 0.5 MG PO TABS: ORAL_TABLET | ORAL | 0 refills | Status: DC

## 2017-07-22 NOTE — Addendum Note (Signed)
Addended by: Anabel Halon on: 07/22/2017 05:04 PM   Modules accepted: Orders

## 2017-07-22 NOTE — Telephone Encounter (Signed)
I reviewed his history of anxiety. Documented rare by other provider on note review. Since he is new will give limited supply. When he runs out of this want him to make visit to discuss his anxiety and get documented that we had discussion.  You can call him and fax that.

## 2017-07-22 NOTE — Telephone Encounter (Signed)
Pt notified. Pt wants to know if Alprazolam will be refilled.

## 2017-07-23 ENCOUNTER — Other Ambulatory Visit: Payer: Self-pay

## 2017-07-27 ENCOUNTER — Encounter: Payer: Self-pay | Admitting: Family Medicine

## 2017-07-27 ENCOUNTER — Ambulatory Visit: Payer: Self-pay | Admitting: Osteopathic Medicine

## 2017-07-28 ENCOUNTER — Other Ambulatory Visit: Payer: Self-pay | Admitting: Family Medicine

## 2017-07-28 ENCOUNTER — Encounter: Payer: Self-pay | Admitting: Medical

## 2017-08-02 MED ORDER — FENOFIBRATE MICRONIZED 130 MG PO CAPS: 130.0000 mg | ORAL_CAPSULE | Freq: Every day | ORAL | 0 refills | Status: DC

## 2017-09-05 ENCOUNTER — Other Ambulatory Visit: Payer: Self-pay | Admitting: Family Medicine

## 2017-09-06 NOTE — Telephone Encounter (Signed)
To new pcp

## 2017-09-07 NOTE — Telephone Encounter (Signed)
Refilled patient's Lipitor prescription.  Notify patient.

## 2017-09-20 ENCOUNTER — Encounter: Payer: Self-pay | Admitting: Medical

## 2017-09-24 ENCOUNTER — Encounter: Payer: Self-pay | Admitting: Medical

## 2017-09-24 ENCOUNTER — Ambulatory Visit: Payer: BLUE CROSS/BLUE SHIELD | Admitting: Medical

## 2017-09-24 VITALS — BP 122/74 | HR 77 | Resp 16 | Ht 70.0 in | Wt 224.0 lb

## 2017-09-24 DIAGNOSIS — R05 Cough: Secondary | ICD-10-CM

## 2017-09-24 DIAGNOSIS — R059 Cough, unspecified: Secondary | ICD-10-CM

## 2017-09-24 MED ORDER — HYDROCODONE-HOMATROPINE 5-1.5 MG/5ML PO SYRP: 5.0000 mL | ORAL_SOLUTION | Freq: Three times a day (TID) | ORAL | 0 refills | Status: DC | PRN

## 2017-09-24 MED ORDER — PREDNISONE 10 MG PO TABS: ORAL_TABLET | ORAL | 0 refills | Status: DC

## 2017-09-24 NOTE — Progress Notes (Signed)
Subjective:    Patient ID: Erik Cole, male    DOB: 02/19/1971, 47 y.o.   MRN: [id number]  HPI   Pt in with some recent dry cough. He states cough is random all day. Pt states over past 3 month intermittent cough but also 2-3 URI type infections.  But last 2 weeks no associated uri or allergy signs or symptoms.  Pt states he does have some allergy symptoms in spring when pollen. He tried zyrtec for one wee and did not help with cough.   Pt does not report any wheezing.  He denies any heart burn. No belching.  No hx of smoking or second hand smoke.    Review of Systems  Constitutional: Negative for chills, fatigue and fever.  HENT: Positive for congestion. Negative for nosebleeds, rhinorrhea and sinus pain.        See hpi on and off uri type symptoms.   Respiratory: Positive for cough. Negative for shortness of breath and wheezing.   Cardiovascular: Negative for chest pain and palpitations.  Gastrointestinal: Negative for abdominal pain.  Musculoskeletal: Negative for back pain, myalgias and neck pain.  Neurological: Positive for headaches. Negative for dizziness, speech difficulty and weakness.  Hematological: Negative for adenopathy. Does not bruise/bleed easily.  Psychiatric/Behavioral: Positive for sleep disturbance. Negative for behavioral problems and confusion. The patient is not nervous/anxious.     Past Medical History:  Diagnosis Date  . Anxiety    mild, xanax sparingly  . GERD (gastroesophageal reflux disease)   . History of chicken pox   . HLD (hyperlipidemia)    previous PCP stopped tricor 2/2 elevated LFTs 07/2010  . 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 Obesity   . Transaminitis    hx mild, thought fibrate related - w/u ~2008 neg HbsAg, Hep C, ANA, nl iron/ferritin, ceruloplasmin, a1-antitrypsin, normal [place]     Social History   Socioeconomic History  . Marital status: Married    Spouse name: Not on file  . Number of children: 2  . Years  of education: college  . Highest education level: Not on file  Social Needs  . Financial resource strain: Not on file  . Food insecurity - worry: Not on file  . Food insecurity - inability: Not on file  . Transportation needs - medical: Not on file  . Transportation needs - non-medical: Not on file  Occupational History  . Occupation: Personal assistant: [employer]    Comment: [employer]  Tobacco Use  . Smoking status: Never Smoker  . Smokeless tobacco: Never Used  Substance and Sexual Activity  . Alcohol use: Yes    Alcohol/week: 0.0 oz    Comment: Rare  . Drug use: No  . Sexual activity: Not on file  Other Topics Concern  . Not on file  Social History Narrative   Caffeine: occasionally.   Lives with wife [name]), 2 children (son and daughter), 1 cat   Occupation:    Activity: no regular exercise   Diet: good water, daily fruits/vegetables, red meat rarely, fish 1x/wk, cut out fast food    Past Surgical History:  Procedure Laterality Date  . COLONOSCOPY  10/2015   one polyp, diverticulosis, rpt 5 yrs ([facility])  . GANGLION CYST EXCISION Right 2008    wrist    Family History  Problem Relation Age of Onset  . Hypertension Mother   . Hyperlipidemia Father        TG  . Diverticulitis Father   .  Diabetes Maternal Grandmother   . Coronary artery disease Paternal Grandfather 55       unhealthy  . Breast cancer Other 10       great grandmother, maternal  . Stroke Neg Hx   . Colon cancer Neg Hx   . Colon polyps Neg Hx     Allergies  Allergen Reactions  . Tricor [Fenofibrate] Other (See Comments)    transaminitis    Current Outpatient Medications on File Prior to Visit  Medication Sig Dispense Refill  . ALPRAZolam (XANAX) 0.5 MG tablet 1 tab po bid prn anxiety 15 tablet 0  . atorvastatin (LIPITOR) 10 MG tablet TAKE 1 TABLET DAILY 90 tablet 3  . cetirizine (ZYRTEC) 10 MG tablet Take 10 mg by mouth as needed for allergies. Reported on 10/31/2015    .  ciprofloxacin (CIPRO) 500 MG tablet Take 1 tablet (500 mg total) by mouth 2 (two) times daily. 14 tablet 0  . fenofibrate micronized (ANTARA) 130 MG capsule Take 1 capsule (130 mg total) by mouth daily. 90 capsule 0  . omeprazole (PRILOSEC) 20 MG capsule Take 1 capsule (20 mg total) by mouth daily as needed.     No current facility-administered medications on file prior to visit.     BP 122/74 (BP Location: Right Arm, Patient Position: Sitting, Cuff Size: Large)   Pulse 77   Resp 16   Ht 5\' 10"  (1.778 m)   Wt 224 lb (101.6 kg)   SpO2 96%   BMI 32.14 kg/m       Objective:   Physical Exam  General  Mental Status - Alert. General Appearance - Well groomed. Not in acute distress.  Skin Rashes- No Rashes.  HEENT Head- Normal. Ear Auditory Canal - Left- Normal. Right - Normal.Tympanic Membrane- Left- Normal. Right- Normal. Eye Sclera/Conjunctiva- Left- Normal. Right- Normal. Nose & Sinuses Nasal Mucosa- Left-  Boggy and Congested. Right-  Boggy and  Congested.Bilateral no  maxillary and no  frontal sinus pressure. Mouth & Throat Lips: Upper Lip- Normal: no dryness, cracking, pallor, cyanosis, or vesicular eruption. Lower Lip-Normal: no dryness, cracking, pallor, cyanosis or vesicular eruption. Buccal Mucosa- Bilateral- No Aphthous ulcers. Oropharynx- No Discharge or Erythema. Tonsils: Characteristics- Bilateral- No Erythema or Congestion. Size/Enlargement- Bilateral- No enlargement. Discharge- bilateral-None.  Neck Neck- Supple. No Masses.   Chest and Lung Exam Auscultation: Breath Sounds:-Clear even and unlabored.  Cardiovascular Auscultation:Rythm- Regular, rate and rhythm. Murmurs & Other Heart Sounds:Ausculatation of the heart reveal- No Murmurs.  Lymphatic Head & Neck General Head & Neck Lymphatics: Bilateral: Description- No Localized lymphadenopathy.       Assessment & Plan:  For your persisting cough over the past 2 months, I do want to get a chest x-ray  today.  If not able to get today then can come back next week as the order is placed as future.    The cause of your cough is not entirely clear at this point.  On exam you do have some postnasal drainage and you have history of  allergies in the spring.  Recently did not respond to zyrtec  but you do report in the past responding to prednisone  when you had severe cough in the past.   So I did prescribe you 5-day taper dose of prednisone.  Also prescribed Hycodan cough syrup.     Follow your response to above treatment  and notify you of x-ray results .  Follow-up in 7-10 days or as needed.

## 2017-09-24 NOTE — Patient Instructions (Addendum)
For your persisting cough over the past 2 months, I do want to get a chest x-ray today.  If not able to get today then can come back next week as the order is placed as future.    The cause of your cough is not entirely clear at this point.  On exam you do have some postnasal drainage and you have history of  allergies in the spring.  Recently did not respond to zyrtec  but you do report in the past responding to prednisone  when you had severe cough in the past.   So I did prescribe you 5-day taper dose of prednisone.  Also prescribed Hycodan cough syrup.     Follow your response to above treatment  and notify you of x-ray results .  Follow-up in 7-10 days or as needed.

## 2017-09-25 ENCOUNTER — Encounter: Payer: Self-pay | Admitting: Medical

## 2017-09-27 ENCOUNTER — Other Ambulatory Visit: Payer: Self-pay | Admitting: Internal Medicine

## 2017-10-03 ENCOUNTER — Other Ambulatory Visit: Payer: Self-pay | Admitting: Medical

## 2017-10-05 NOTE — Telephone Encounter (Signed)
I gave patient a refill of Xanax in January after seeing him for a complete physical exam.  Before I gave him the brief refill it was written by another provider.  It looks like I sent in the prescription a day or 2 after I saw him.  I reviewed the wellness exam and we did not talk about anxiety according to that note.  He would need a follow-up appointment with me so I can document the reason I am giving this medication.  Xanax is a controlled medication and [place] has a lot of rules and regulations now regarding dispensing/prescribing Xanax and other controlled medications.  Please offer him a convenient appointment preferably early morning or early afternoon that works for him to reduce wait time.  Try to avoid Thursday afternoon.

## 2017-10-05 NOTE — Telephone Encounter (Signed)
Requesting:Alprazolam Contract:03/06/15 Needs updated csc LDK:[facility]QF Last Visit:09/24/17 Next Visit:none with pcp Last Refill:07/22/17 #10 0-R  Please Advise

## 2017-10-06 NOTE — Telephone Encounter (Signed)
Left message to return call. Ok for pec to discuss.  

## 2017-10-06 NOTE — Telephone Encounter (Signed)
Spoke with patient this afternoon, he states he never picked up temporary prescription for alprazolam. It does look like it was printed rather than going electronically. I explained to him even though he did not pick it up, the rules and regulations of the controlled substance would need him to come in. He verbalized understanding and states he is traveling this weekend and needs alprazolam to help with traveling. I gave him a 2:45 appointment on Friday with you.

## 2017-10-08 ENCOUNTER — Ambulatory Visit: Payer: BLUE CROSS/BLUE SHIELD | Admitting: Medical

## 2017-10-08 ENCOUNTER — Encounter: Payer: Self-pay | Admitting: Medical

## 2017-10-08 VITALS — BP 128/75 | HR 94 | Temp 98.9°F | Resp 16 | Ht 70.0 in | Wt 222.2 lb

## 2017-10-08 DIAGNOSIS — F419 Anxiety disorder, unspecified: Secondary | ICD-10-CM

## 2017-10-08 DIAGNOSIS — Z79899 Other long term (current) drug therapy: Secondary | ICD-10-CM

## 2017-10-08 MED ORDER — ALPRAZOLAM 0.5 MG PO TABS: 0.5000 mg | ORAL_TABLET | Freq: Every evening | ORAL | 0 refills | Status: DC | PRN

## 2017-10-08 MED FILL — ALPRAZolam 0.5 MG TABS: 0.5 | 15 days supply | Qty: 15 | Fill #0

## 2017-10-08 NOTE — Patient Instructions (Addendum)
For your history of anxiety, did go ahead and prescribe Xanax 0.5 mg tablets 1 tablet twice daily as needed anxiety.  You use this medication very sparingly in the past and would encourage you to continue to use it sparingly as we discussed.  Since anticipate about 4 refills of this year, I do think it is best for you to have signed controlled medication contract and give UDS.  Follow-up in 6 months or as needed.  Discussed with Dr. Etter Sjogren frequency he will need to come in.

## 2017-10-08 NOTE — Progress Notes (Signed)
Subjective:    Patient ID: Erik Cole, male    DOB: April 23, 1971, 47 y.o.   MRN: [id number]  HPI  Pt in for follow up.  Pt has hx of some anxiety and stress.  In 2008 and 2009 he had some severe panic attacks when he had some family issues. Occasionally has some social anxiety and uses at business meeting. States helps him relax since he is introverted.  He states if he gets has to lead talk/meeting at work does need anxiety medication.  In the past described that if he did lead a business meeting sometimes he would get hot, flush nervous and start to sweat.  With Xanax those symptoms are much improved.  Also helps him deal with flying anxiety.  No depression.   Review of Systems  Constitutional: Negative for chills, fatigue and fever.  Respiratory: Negative for cough, choking, shortness of breath and wheezing.   Cardiovascular: Negative for chest pain and palpitations.  Gastrointestinal: Negative for abdominal pain.  Musculoskeletal: Negative for back pain.  Skin: Negative for rash.  Neurological: Negative for dizziness, facial asymmetry and weakness.  Hematological: Negative for adenopathy. Does not bruise/bleed easily.  Psychiatric/Behavioral: Negative for behavioral problems and dysphoric mood. The patient is nervous/anxious.     Past Medical History:  Diagnosis Date  . Anxiety    mild, xanax sparingly  . GERD (gastroesophageal reflux disease)   . History of chicken pox   . HLD (hyperlipidemia)    previous PCP stopped tricor 2/2 elevated LFTs 07/2010  . 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 Obesity   . Transaminitis    hx mild, thought fibrate related - w/u ~2008 neg HbsAg, Hep C, ANA, nl iron/ferritin, ceruloplasmin, a1-antitrypsin, normal [place]     Social History   Socioeconomic History  . Marital status: Married    Spouse name: Not on file  . Number of children: 2  . Years of education: college  . Highest education level: Not on file  Occupational  History  . Occupation: Personal assistant: OTHER    Comment: [place]  . Financial resource strain: Not on file  . Food insecurity:    Worry: Not on file    Inability: Not on file  . Transportation needs:    Medical: Not on file    Non-medical: Not on file  Tobacco Use  . Smoking status: Never Smoker  . Smokeless tobacco: Never Used  Substance and Sexual Activity  . Alcohol use: Yes    Alcohol/week: 0.0 oz    Comment: Rare  . Drug use: No  . Sexual activity: Not on file  Lifestyle  . Physical activity:    Days per week: Not on file    Minutes per session: Not on file  . Stress: Not on file  Relationships  . Social connections:    Talks on phone: Not on file    Gets together: Not on file    Attends religious service: Not on file    Active member of club or organization: Not on file    Attends meetings of clubs or organizations: Not on file    Relationship status: Not on file  . Intimate partner violence:    Fear of current or ex partner: Not on file    Emotionally abused: Not on file    Physically abused: Not on file    Forced sexual activity: Not on file  Other Topics Concern  . Not on file  Social History Narrative   Caffeine: occasionally.   Lives with wife [name]), 2 children (son and daughter), 1 cat   Occupation:    Activity: no regular exercise   Diet: good water, daily fruits/vegetables, red meat rarely, fish 1x/wk, cut out fast food    Past Surgical History:  Procedure Laterality Date  . COLONOSCOPY  10/2015   one polyp, diverticulosis, rpt 5 yrs ([facility])  . GANGLION CYST EXCISION Right 2008    wrist    Family History  Problem Relation Age of Onset  . Hypertension Mother   . Hyperlipidemia Father        TG  . Diverticulitis Father   . Diabetes Maternal Grandmother   . Coronary artery disease Paternal Grandfather 55       unhealthy  . Breast cancer Other 26       great grandmother, maternal  . Stroke Neg Hx   .  Colon cancer Neg Hx   . Colon polyps Neg Hx     Allergies  Allergen Reactions  . Tricor [Fenofibrate] Other (See Comments)    transaminitis    Current Outpatient Medications on File Prior to Visit  Medication Sig Dispense Refill  . ALPRAZolam (XANAX) 0.5 MG tablet 1 tab po bid prn anxiety 15 tablet 0  . atorvastatin (LIPITOR) 10 MG tablet TAKE 1 TABLET DAILY 90 tablet 3  . cetirizine (ZYRTEC) 10 MG tablet Take 10 mg by mouth as needed for allergies. Reported on 10/31/2015    . fenofibrate micronized (ANTARA) 130 MG capsule Take 1 capsule (130 mg total) by mouth daily. 90 capsule 0  . HYDROcodone-homatropine (HYCODAN) 5-1.5 MG/5ML syrup Take 5 mLs by mouth every 8 (eight) hours as needed. 100 mL 0  . omeprazole (PRILOSEC) 20 MG capsule Take 1 capsule (20 mg total) by mouth daily as needed.     No current facility-administered medications on file prior to visit.     BP 128/75 (BP Location: Left Arm, Patient Position: Sitting, Cuff Size: Small)   Pulse 94   Temp 98.9 F (37.2 C) (Oral)   Resp 16   Ht 5\' 10"  (1.778 m)   Wt 222 lb 3.2 oz (100.8 kg)   SpO2 98%   BMI 31.88 kg/m       Objective:   Physical Exam  General Mental Status- Alert. General Appearance- Not in acute distress.   Skin General: Color- Normal Color. Moisture- Normal Moisture.  Neck Carotid Arteries- Normal color. Moisture- Normal Moisture. No carotid bruits. No JVD.  Chest and Lung Exam Auscultation: Breath Sounds:-Normal.  Cardiovascular Auscultation:Rythm- Regular. Murmurs & Other Heart Sounds:Auscultation of the heart reveals- No Murmurs.  Abdomen Inspection:-Inspeection Normal. Palpation/Percussion:Note:No mass. Palpation and Percussion of the abdomen reveal- Non Tender, Non Distended + BS, no rebound or guarding.    Neurologic Cranial Nerve exam:- CN III-XII intact(No nystagmus), symmetric smile. Strength:- 5/5 equal and symmetric strength both upper and lower extremities.        Assessment & Plan:  For your history of anxiety, did go ahead and prescribe Xanax 0.5 mg tablets 1 tablet twice daily as needed anxiety.  You use this medication very sparingly in the past and would encourage you to continue to use it sparingly as we discussed.  Since anticipate about 4 refills of this year, I do think it is best for you to have signed controlled medication contract and give UDS.  Follow-up in 6 months or as needed.  Mackie Pai, PA-C

## 2017-10-11 LAB — PAIN MGMT, PROFILE 8 W/CONF, U
6 Acetylmorphine: NEGATIVE ng/mL (ref <10)
ALCOHOL METABOLITES: NEGATIVE ng/mL (ref <500)
ALPHAHYDROXYMIDAZOLAM: NEGATIVE ng/mL (ref <50)
ALPHAHYDROXYTRIAZOLAM: NEGATIVE ng/mL (ref <50)
AMINOCLONAZEPAM: NEGATIVE ng/mL (ref <25)
Alphahydroxyalprazolam: NEGATIVE ng/mL (ref <25)
Amphetamines: NEGATIVE ng/mL (ref <500)
BUPRENORPHINE, URINE: NEGATIVE ng/mL (ref <5)
Benzodiazepines: NEGATIVE ng/mL (ref <100)
COCAINE METABOLITE: NEGATIVE ng/mL (ref <150)
CREATININE: 206 mg/dL
Hydroxyethylflurazepam: NEGATIVE ng/mL (ref <50)
LORAZEPAM: NEGATIVE ng/mL (ref <50)
MDA: NEGATIVE ng/mL (ref <200)
MDMA: NEGATIVE ng/mL (ref <500)
MDMA: NEGATIVE ng/mL (ref <200)
Marijuana Metabolite: NEGATIVE ng/mL (ref <20)
Nordiazepam: NEGATIVE ng/mL (ref <50)
Opiates: NEGATIVE ng/mL (ref <100)
Oxazepam: NEGATIVE ng/mL (ref <50)
Oxidant: NEGATIVE ug/mL (ref <200)
Oxycodone: NEGATIVE ng/mL (ref <100)
PH: 5.9 (ref 4.5–9.0)
TEMAZEPAM: NEGATIVE ng/mL (ref <50)

## 2017-12-26 ENCOUNTER — Other Ambulatory Visit: Payer: Self-pay | Admitting: Medical

## 2017-12-27 MED ORDER — FENOFIBRATE MICRONIZED 130 MG PO CAPS: 130.0000 mg | ORAL_CAPSULE | Freq: Every day | ORAL | 3 refills | Status: DC

## 2017-12-27 NOTE — Telephone Encounter (Signed)
Refilled pt fenofibrate today. [facility]

## 2018-03-25 ENCOUNTER — Ambulatory Visit: Payer: BLUE CROSS/BLUE SHIELD | Admitting: Medical

## 2018-03-25 ENCOUNTER — Encounter: Payer: Self-pay | Admitting: Medical

## 2018-03-25 VITALS — BP 121/75 | HR 74 | Temp 98.1°F | Ht 70.0 in | Wt 231.8 lb

## 2018-03-25 DIAGNOSIS — E785 Hyperlipidemia, unspecified: Secondary | ICD-10-CM

## 2018-03-25 DIAGNOSIS — R739 Hyperglycemia, unspecified: Secondary | ICD-10-CM | POA: Diagnosis not present

## 2018-03-25 DIAGNOSIS — F419 Anxiety disorder, unspecified: Secondary | ICD-10-CM

## 2018-03-25 MED ORDER — ALPRAZOLAM 0.5 MG PO TABS: 0.5000 mg | ORAL_TABLET | Freq: Every evening | ORAL | 0 refills | Status: DC | PRN

## 2018-03-25 MED FILL — ALPRAZolam 0.5 MG TABS: 0.5 | 15 days supply | Qty: 15 | Fill #0

## 2018-03-25 NOTE — Patient Instructions (Signed)
Your anxiety is very well controlled with occasional rare use of Xanax.  I am again prescribing 15 tablets to use if needed.  Up-to-date on contract and UDS.  For mild high sugar and history of low HDL, recommend low sugar and low-cholesterol diet.  Also continue to get some daily exercise.  Would ask that you schedule a complete physical exam at the end of January.  At that time will include routine labs as well as a 10-month blood sugar average.  Also, if you want a flu vaccine you could call middle of October and get placed on under flu vaccine schedule.

## 2018-03-25 NOTE — Progress Notes (Signed)
Subjective:    Patient ID: Erik Cole, male    DOB: 1970-10-01, 47 y.o.   MRN: [id number]  HPI  Pt in for follow up.  He uses xanax very sparingly. Only 14 tablets over 6 month period. He only uses it in very high stress situations related to work or travel. He reports no depression.  No reported adverse side effects with xanax except sedation. So rarely takes.  Pt has hx of high triglycerides and low hdl. He states high trigylcerides since 20's. On fenofibrate.   Pt sugars mild elevated on lab on CPE.   On review pt denies any fall allergies.     Review of Systems  Constitutional: Negative for chills, fatigue and fever.  Musculoskeletal: Negative for back pain.  Neurological: Negative for dizziness, seizures, speech difficulty, weakness and light-headedness.  Hematological: Negative for adenopathy. Does not bruise/bleed easily.  Psychiatric/Behavioral: Negative for behavioral problems, confusion, dysphoric mood, sleep disturbance and suicidal ideas. The patient is nervous/anxious.        Controlled with med xanax.    Past Medical History:  Diagnosis Date  . Anxiety    mild, xanax sparingly  . GERD (gastroesophageal reflux disease)   . History of chicken pox   . HLD (hyperlipidemia)    previous PCP stopped tricor 2/2 elevated LFTs 07/2010  . 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 Obesity   . Transaminitis    hx mild, thought fibrate related - w/u ~2008 neg HbsAg, Hep C, ANA, nl iron/ferritin, ceruloplasmin, a1-antitrypsin, normal [place]     Social History   Socioeconomic History  . Marital status: Married    Spouse name: Not on file  . Number of children: 2  . Years of education: college  . Highest education level: Not on file  Occupational History  . Occupation: Personal assistant: OTHER    Comment: [employer]  . Financial resource strain: Not on file  . Food insecurity:    Worry: Not on file    Inability: Not on file  .  Transportation needs:    Medical: Not on file    Non-medical: Not on file  Tobacco Use  . Smoking status: Never Smoker  . Smokeless tobacco: Never Used  Substance and Sexual Activity  . Alcohol use: Yes    Alcohol/week: 0.0 standard drinks    Comment: Rare  . Drug use: No  . Sexual activity: Not on file  Lifestyle  . Physical activity:    Days per week: Not on file    Minutes per session: Not on file  . Stress: Not on file  Relationships  . Social connections:    Talks on phone: Not on file    Gets together: Not on file    Attends religious service: Not on file    Active member of club or organization: Not on file    Attends meetings of clubs or organizations: Not on file    Relationship status: Not on file  . Intimate partner violence:    Fear of current or ex partner: Not on file    Emotionally abused: Not on file    Physically abused: Not on file    Forced sexual activity: Not on file  Other Topics Concern  . Not on file  Social History Narrative   Caffeine: occasionally.   Lives with wife [name]), 2 children (son and daughter), 1 cat   Occupation:    Activity: no regular exercise   Diet:  good water, daily fruits/vegetables, red meat rarely, fish 1x/wk, cut out fast food    Past Surgical History:  Procedure Laterality Date  . COLONOSCOPY  10/2015   one polyp, diverticulosis, rpt 5 yrs ([facility])  . GANGLION CYST EXCISION Right 2008    wrist    Family History  Problem Relation Age of Onset  . Hypertension Mother   . Hyperlipidemia Father        TG  . Diverticulitis Father   . Diabetes Maternal Grandmother   . Coronary artery disease Paternal Grandfather 55       unhealthy  . Breast cancer Other 27       great grandmother, maternal  . Stroke Neg Hx   . Colon cancer Neg Hx   . Colon polyps Neg Hx     Allergies  Allergen Reactions  . Tricor [Fenofibrate] Other (See Comments)    transaminitis    Current Outpatient Medications on File Prior to  Visit  Medication Sig Dispense Refill  . atorvastatin (LIPITOR) 10 MG tablet TAKE 1 TABLET DAILY 90 tablet 3  . cetirizine (ZYRTEC) 10 MG tablet Take 10 mg by mouth as needed for allergies. Reported on 10/31/2015    . fenofibrate micronized (ANTARA) 130 MG capsule Take 1 capsule (130 mg total) by mouth daily. 90 capsule 3  . omeprazole (PRILOSEC) 20 MG capsule Take 1 capsule (20 mg total) by mouth daily as needed.     No current facility-administered medications on file prior to visit.     BP 121/75 (BP Location: Right Arm, Patient Position: Sitting, Cuff Size: Large)   Pulse 74   Temp 98.1 F (36.7 C) (Oral)   Ht 5\' 10"  (1.778 m)   Wt 231 lb 12.8 oz (105.1 kg)   SpO2 99%   BMI 33.26 kg/m       Objective:   Physical Exam  General Mental Status- Alert. General Appearance- Not in acute distress.   Skin General: Color- Normal Color. Moisture- Normal Moisture.  Neck Carotid Arteries- Normal color. Moisture- Normal Moisture. No carotid bruits. No JVD.  Chest and Lung Exam Auscultation: Breath Sounds:-Normal.  Cardiovascular Auscultation:Rythm- Regular. Murmurs & Other Heart Sounds:Auscultation of the heart reveals- No Murmurs.  . Neurologic Cranial Nerve exam:- CN III-XII intact(No nystagmus), symmetric smile. Strength:- 5/5 equal and symmetric strength both upper and lower extremities.      Assessment & Plan:  Your anxiety is very well controlled with occasional rare use of Xanax.  I am again prescribing 15 tablets to use if needed.  Up-to-date on contract and UDS.  For mild high sugar and history of low HDL, recommend low sugar and low-cholesterol diet.  Also continue to get some daily exercise.  Would ask that you schedule a complete physical exam at the end of January.  At that time will include routine labs as well as a 8-month blood sugar average.  Also, if you want a flu vaccine you could call middle of October and get placed on under flu vaccine  schedule.  Mackie Pai, PA-C

## 2018-07-18 ENCOUNTER — Encounter: Payer: Self-pay | Admitting: Medical

## 2018-07-18 ENCOUNTER — Emergency Department (HOSPITAL_BASED_OUTPATIENT_CLINIC_OR_DEPARTMENT_OTHER)
Admission: EM | Admit: 2018-07-18 | Discharge: 2018-07-18 | Disposition: A | Discharge status: Home / Self Care | Payer: BLUE CROSS/BLUE SHIELD | Attending: Emergency Medicine | Admitting: Emergency Medicine

## 2018-07-18 ENCOUNTER — Ambulatory Visit: Payer: Self-pay

## 2018-07-18 ENCOUNTER — Emergency Department (HOSPITAL_BASED_OUTPATIENT_CLINIC_OR_DEPARTMENT_OTHER): Payer: BLUE CROSS/BLUE SHIELD

## 2018-07-18 ENCOUNTER — Other Ambulatory Visit: Payer: Self-pay

## 2018-07-18 ENCOUNTER — Encounter (HOSPITAL_BASED_OUTPATIENT_CLINIC_OR_DEPARTMENT_OTHER): Payer: Self-pay | Admitting: Emergency Medicine

## 2018-07-18 DIAGNOSIS — Z79899 Other long term (current) drug therapy: Secondary | ICD-10-CM | POA: Diagnosis not present

## 2018-07-18 DIAGNOSIS — F419 Anxiety disorder, unspecified: Secondary | ICD-10-CM | POA: Diagnosis not present

## 2018-07-18 DIAGNOSIS — R42 Dizziness and giddiness: Secondary | ICD-10-CM | POA: Insufficient documentation

## 2018-07-18 LAB — CBC
HCT: 46.5 % (ref 39.0–52.0)
HEMOGLOBIN: 15.1 g/dL (ref 13.0–17.0)
MCH: 27.8 pg (ref 26.0–34.0)
MCHC: 32.5 g/dL (ref 30.0–36.0)
MCV: 85.5 fL (ref 80.0–100.0)
Platelets: 352 10*3/uL (ref 150–400)
RBC: 5.44 MIL/uL (ref 4.22–5.81)
RDW: 12.8 % (ref 11.5–15.5)
WBC: 8.4 10*3/uL (ref 4.0–10.5)
nRBC: 0 % (ref 0.0–0.2)

## 2018-07-18 LAB — BASIC METABOLIC PANEL
Anion gap: 8 (ref 5–15)
BUN: 14 mg/dL (ref 6–20)
CO2: 22 mmol/L (ref 22–32)
CREATININE: 1.11 mg/dL (ref 0.61–1.24)
Calcium: 9.5 mg/dL (ref 8.9–10.3)
Chloride: 106 mmol/L (ref 98–111)
GFR calc Af Amer: >60 mL/min (ref >60)
GFR calc non Af Amer: >60 mL/min (ref >60)
GLUCOSE: 124 mg/dL — AB (ref 70–99)
POTASSIUM: 3.8 mmol/L (ref 3.5–5.1)
Sodium: 136 mmol/L (ref 135–145)

## 2018-07-18 LAB — TROPONIN I
Troponin I: <0.03 ng/mL (ref <0.03)
Troponin I: <0.03 ng/mL (ref <0.03)

## 2018-07-18 IMAGING — DX DG CHEST 2V
2 series · 2 of 2 positions shown · non-contrast
Comparison: None.

CLINICAL DATA: Dizziness

EXAM:
CHEST - 2 VIEW

[chest pa]
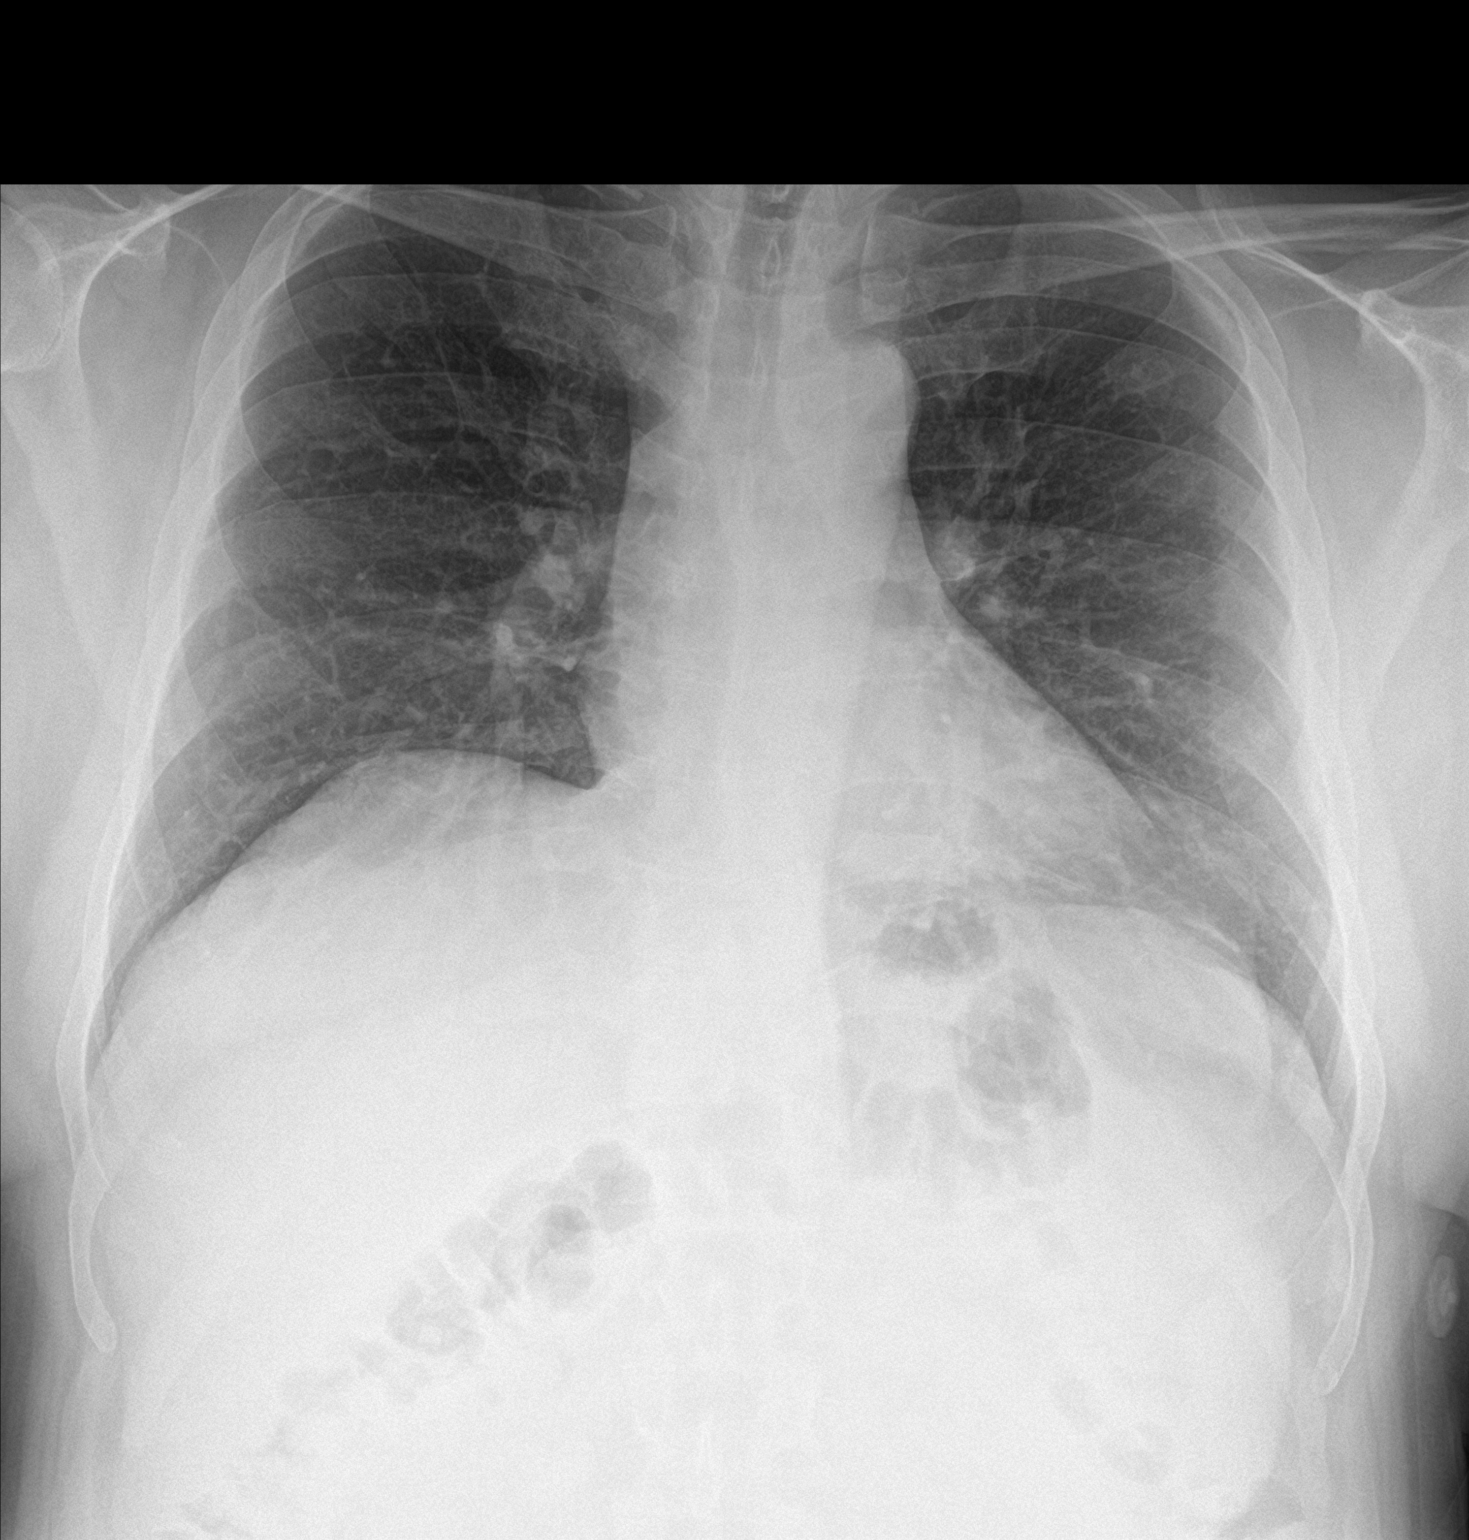

[chest lat]
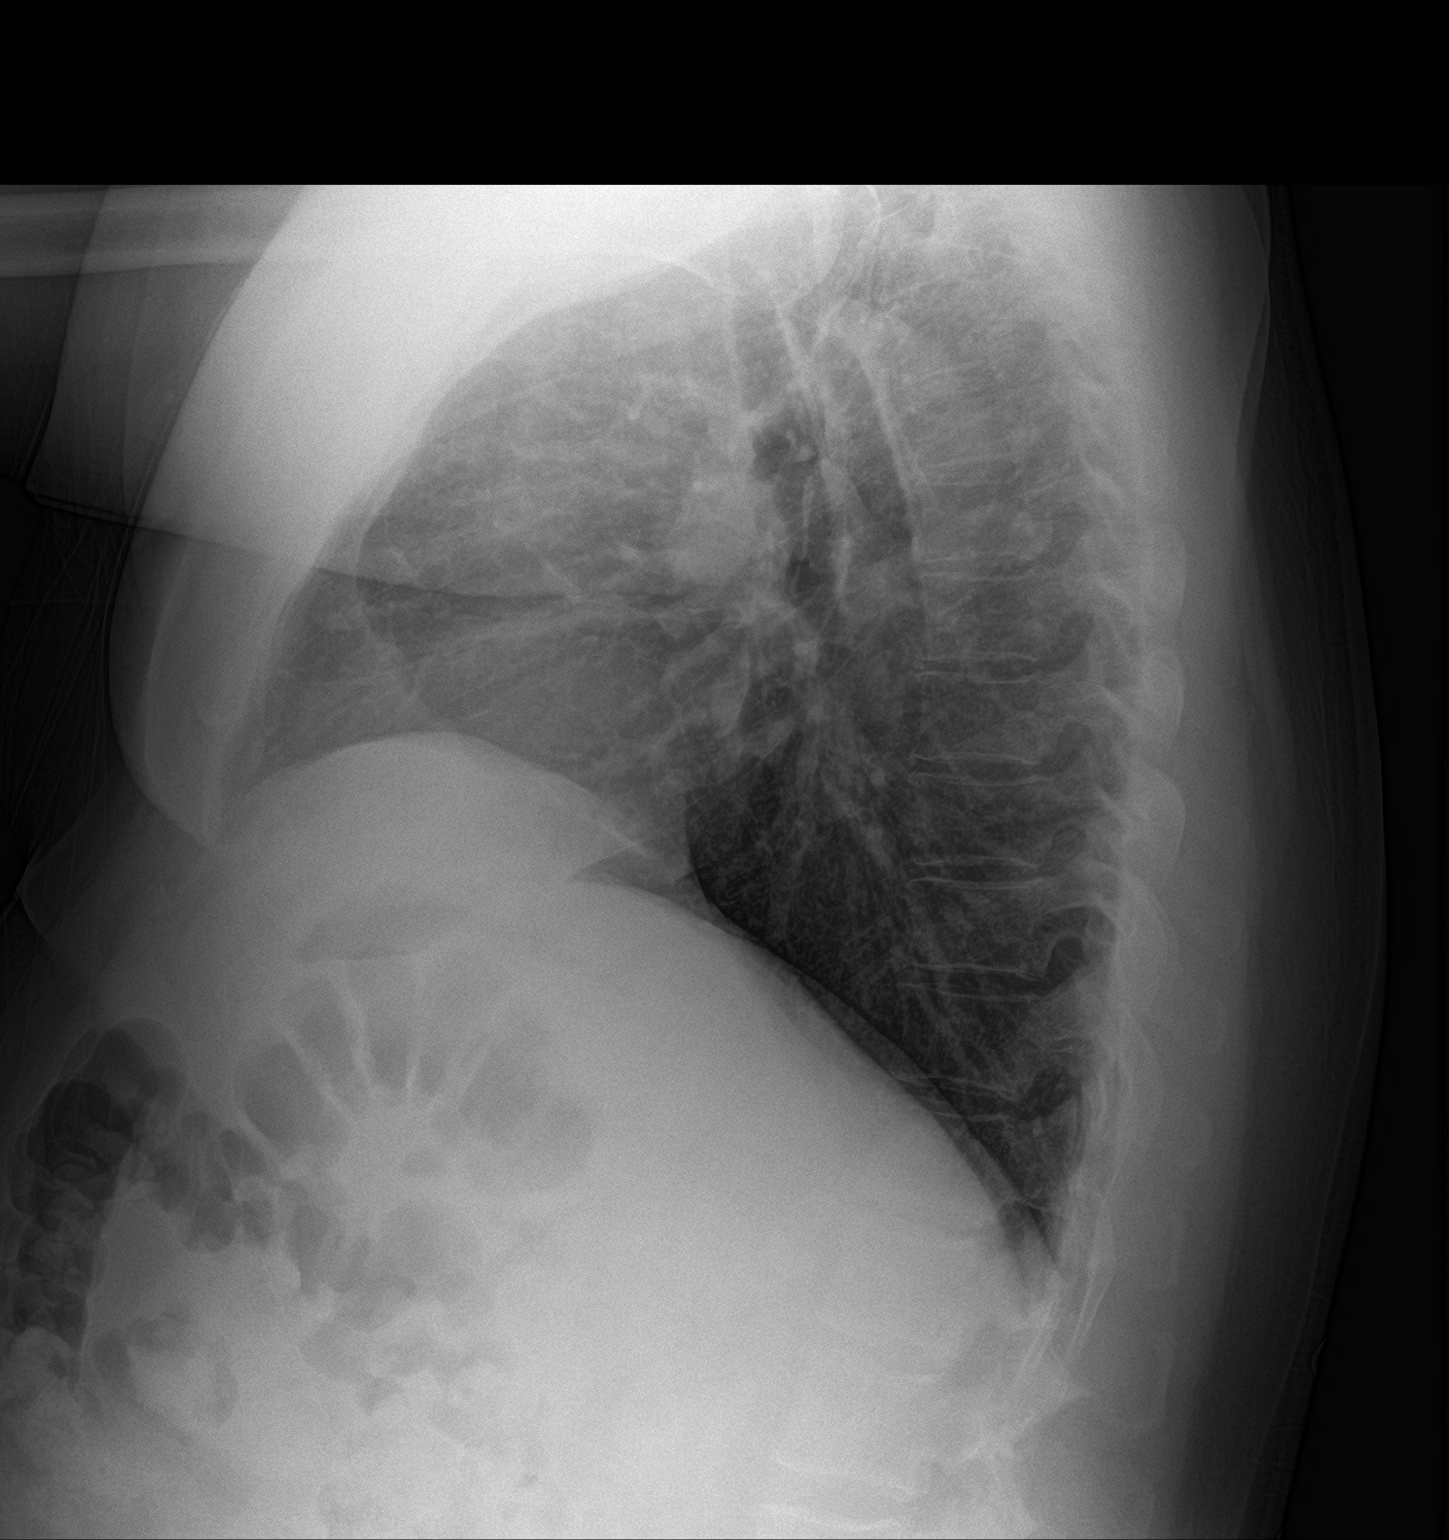

[2 of 2 positions shown; findings below may reference images not displayed]

FINDINGS: The heart size and mediastinal contours are within normal limits.
Both lungs are clear. The visualized skeletal structures are
unremarkable.
IMPRESSION: No active cardiopulmonary disease.

## 2018-07-18 NOTE — ED Notes (Signed)
Pt taken off monitor and allowed to get dressed while awaiting repeat troponin as instructed by Henderly PA.

## 2018-07-18 NOTE — ED Triage Notes (Signed)
Reports feeling dizzy and lightheaded at work today.  States his jaw became tight.  States he took half of his xanax today.

## 2018-07-18 NOTE — Telephone Encounter (Signed)
Pt wife called to say she has picked her husband up at work who has lightheadness at work. Erik Cole states that today he felt clammy at his desk and nauseated. He was dizzy and started to get tightness in his jaw. His wife states that Erik Cole has anxiety issues and she has had him take a half of a tablet of his medication. Erik Cole states he has been having a lot of issues with his reflux for the past week. Pt is traveling by  car with his wife driving. She states that they stopped at an urgent care that was too busy but that Erik Cole was walking fine. Per protocol pt will go to ED to be evaluated for heart issues. Care advice given.  Both he and his wife verbalized understanding of instructions.  Reason for Disposition . Patient sounds very sick or weak to the triager    Pt symptoms of heart issues will go to ED for evaluation.  Answer Assessment - Initial Assessment Questions 1. DESCRIPTION: "Describe your dizziness."     lighheaded 2. LIGHTHEADED: "Do you feel lightheaded?" (e.g., somewhat faint, woozy, weak upon standing)    No just weak 3. VERTIGO: "Do you feel like either you or the room is spinning or tilting?" (i.e. vertigo)     no 4. SEVERITY: "How bad is it?"  "Do you feel like you are going to faint?" "Can you stand and walk?"   - MILD - walking normally   - MODERATE - interferes with normal activities (e.g., work, school)    - SEVERE - unable to stand, requires support to walk, feels like passing out now.      Mild walking normal 5. ONSET:  "When did the dizziness begin?"     After lunch 6. AGGRAVATING FACTORS: "Does anything make it worse?" (e.g., standing, change in head position)     At first 7. HEART RATE: "Can you tell me your heart rate?" "How many beats in 15 seconds?"  (Note: not all patients can do this)       90 70's 8. CAUSE: "What do you think is causing the dizziness?"     no 9. RECURRENT SYMPTOM: "Have you had dizziness before?" If so, ask: "When was  the last time?" "What happened that time?"     Yes  10. OTHER SYMPTOMS: "Do you have any other symptoms?" (e.g., fever, chest pain, vomiting, diarrhea, bleeding)        Reflux, nausea, tightness in his jaw, high BP 165/110 166/108/150/110 11. PREGNANCY: "Is there any chance you are pregnant?" "When was your last menstrual period?"       N/A  Protocols used: DIZZINESS [facility]

## 2018-07-18 NOTE — Discharge Instructions (Addendum)
Evaluated today for dizziness.  Your work-up today in the department was negative.  Please follow-up with your PCP for reevaluation.  Please limit driving until he can be reevaluated.  Return to the ED for any worsening symptoms.

## 2018-07-18 NOTE — ED Provider Notes (Signed)
[facility] EMERGENCY DEPARTMENT Provider Note   CSN: [id number] Arrival date & time: 07/18/18  1512   History   Chief Complaint Chief Complaint  Patient presents with  . Dizziness    HPI Erik Cole is a 48 y.o. male with past medical history significant for GERD, anxiety, hyperlipidemia who presents for evaluation of dizziness.  Patient states he was at work when he began to eat lunch and he felt dizzy.  Patient states he thought the room was spinning.  Patient states he has had these symptom previously.  Has been occurring over the last 2 months intermittently.  Patient has a follow-up with PCP over the next week for evaluation of this.  Patient states when this occurred he started to feel anxious.  States his heart started racing, and his palms started sweating.  Patient states he does take Xanax for anxiety at home.  Patient proceeded to go to his work nurse told where he was told his blood pressure and heart rate was elevated.  Denies headache, vision changes, chest pain, shortness of breath, nausea, vomiting, slurred speech, facial asymmetry, unilateral weakness, abdominal pain, diarrhea.  Patient states when these episodes have occurred before it was usually secondary to not eating.  Denies history of diabetes, however states he gets lightheaded, dizzy and weak if he does not eat every 4 hours.  Patient states he went from 5 PM yesterday evening until 11 AM today between eating.  Denies history of prior cardiac issues, CVA, TIA, hypoglycemia, vertigo.  Patient states when this occurred "felt the room was spinning."  No Numbness or tingling with episode.  He did not have a syncopal episode.  History provided by patient.  No interpreter was used.  HPI  Past Medical History:  Diagnosis Date  . Anxiety    mild, xanax sparingly  . GERD (gastroesophageal reflux disease)   . History of chicken pox   . HLD (hyperlipidemia)    previous PCP stopped tricor 2/2 elevated LFTs  07/2010  . 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 Obesity   . Transaminitis    hx mild, thought fibrate related - w/u ~2008 neg HbsAg, Hep C, ANA, nl iron/ferritin, ceruloplasmin, a1-antitrypsin, normal [place]    Patient Active Problem List   Diagnosis Date Noted  . Snoring 07/24/2015  . History of colonic diverticulitis 04/17/2015  . 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 Healthcare maintenance 12/31/2011  . Obesity, Class I, BMI 30-34.9 07/02/2011  . Anxiety, mild 11/27/2010  . Dyslipidemia   . GERD (gastroesophageal reflux disease)     Past Surgical History:  Procedure Laterality Date  . COLONOSCOPY  10/2015   one polyp, diverticulosis, rpt 5 yrs ([facility])  . GANGLION CYST EXCISION Right 2008    wrist        Home Medications    Prior to Admission medications   Medication Sig Start Date End Date Taking? Authorizing Provider  ALPRAZolam Duanne Moron) 0.5 MG tablet Take 1 tablet (0.5 mg total) by mouth at bedtime as needed for anxiety. 03/25/18   Saguier, Percell Miller, PA-C  atorvastatin (LIPITOR) 10 MG tablet TAKE 1 TABLET DAILY 09/07/17   Saguier, Percell Miller, PA-C  cetirizine (ZYRTEC) 10 MG tablet Take 10 mg by mouth as needed for allergies. Reported on 10/31/2015    [provider]  fenofibrate micronized (ANTARA) 130 MG capsule Take 1 capsule (130 mg total) by mouth daily. 12/27/17   Saguier, Percell Miller, PA-C  omeprazole (PRILOSEC) 20 MG capsule  Take 1 capsule (20 mg total) by mouth daily as needed. 07/24/16   Ria Bush, MD    Family History Family History  Problem Relation Age of Onset  . Hypertension Mother   . Hyperlipidemia Father        TG  . Diverticulitis Father   . Diabetes Maternal Grandmother   . Coronary artery disease Paternal Grandfather 55       unhealthy  . Breast cancer Other 30       great grandmother, maternal  . Stroke Neg Hx   . Colon cancer Neg Hx   . Colon polyps Neg Hx     Social History Social  History   Tobacco Use  . Smoking status: Never Smoker  . Smokeless tobacco: Never Used  Substance Use Topics  . Alcohol use: Yes    Alcohol/week: 0.0 standard drinks    Comment: Rare  . Drug use: No     Allergies   Tricor [fenofibrate]   Review of Systems Review of Systems  Constitutional: Negative.   HENT: Negative.   Eyes: Negative.   Respiratory: Negative.   Cardiovascular: Negative.   Gastrointestinal: Negative.   Endocrine: Negative.   Genitourinary: Negative.   Musculoskeletal: Negative.   Skin: Negative.   Neurological: Positive for dizziness and light-headedness. Negative for tremors, seizures, syncope, facial asymmetry, speech difficulty, numbness and headaches.  All other systems reviewed and are negative.    Physical Exam Updated Vital Signs BP 112/78   Pulse 77   Temp 98.1 F (36.7 C) (Oral)   Resp 15   Ht 5\' 11"  (1.803 m)   Wt 102.1 kg   SpO2 99%   BMI 31.38 kg/m   Physical Exam  Physical Exam  Constitutional: Pt is oriented to person, place, and time. Pt appears well-developed and well-nourished. No distress.  HENT:  Head: Normocephalic and atraumatic.  Mouth/Throat: Oropharynx is clear and moist.  Eyes: Conjunctivae and EOM are normal. Pupils are equal, round, and reactive to light. No scleral icterus.  No horizontal, vertical or rotational nystagmus  Neck: Normal range of motion. Neck supple.  Full active and passive ROM without pain No midline or paraspinal tenderness No nuchal rigidity or meningeal signs  No carotid bruits. Cardiovascular: Normal rate, regular rhythm and intact distal pulses.  No murmur, rubs or gallops. Pulmonary/Chest: Effort normal and breath sounds normal. No respiratory distress. Pt has no wheezes. No rales.  Abdominal: Soft. Bowel sounds are normal. There is no tenderness. There is no rebound and no guarding.  Musculoskeletal: Normal range of motion.  Lymphadenopathy:    No cervical adenopathy.  Neurological:  Pt. is alert and oriented to person, place, and time. He has normal reflexes. No cranial nerve deficit.  Exhibits normal muscle tone. Coordination normal.  Mental Status:  Alert, oriented, thought content appropriate. Speech fluent without evidence of aphasia. Able to follow 2 step commands without difficulty.  Cranial Nerves:  II:  Peripheral visual fields grossly normal, pupils equal, round, reactive to light III,IV, VI: ptosis not present, extra-ocular motions intact bilaterally  V,VII: smile symmetric, facial light touch sensation equal VIII: hearing grossly normal bilaterally  IX,X: midline uvula rise  XI: bilateral shoulder shrug equal and strong XII: midline tongue extension  Motor:  5/5 in upper and lower extremities bilaterally including strong and equal grip strength and dorsiflexion/plantar flexion Sensory: Pinprick and light touch normal in all extremities.  Deep Tendon Reflexes: 2+ and symmetric  Cerebellar: normal finger-to-nose with bilateral upper extremities Gait: normal gait  and balance CV: distal pulses palpable throughout   Skin: Skin is warm and dry. No rash noted. Pt is not diaphoretic.  Psychiatric: Pt has a normal mood and affect. Behavior is normal. Judgment and thought content normal.  Nursing note and vitals reviewed. ED Treatments / Results  Labs (all labs ordered are listed, but only abnormal results are displayed) Labs Reviewed  BASIC METABOLIC PANEL - Abnormal; Notable for the following components:      Result Value   Glucose, Bld 124 (*)    All other components within normal limits  CBC  TROPONIN I  TROPONIN I    EKG EKG Interpretation  Date/Time:  Monday July 18 2018 15:28:59 EST Ventricular Rate:  101 PR Interval:  164 QRS Duration: 90 QT Interval:  334 QTC Calculation: 433 R Axis:   100 Text Interpretation:  Sinus tachycardia Rightward axis Borderline ECG No old tracing to compare Confirmed by Isla Pence [phone]) on 07/18/2018  3:57:36 PM   Radiology Dg Chest 2 View  Result Date: 07/18/2018 CLINICAL DATA:  Dizziness EXAM: CHEST - 2 VIEW COMPARISON:  None. FINDINGS: The heart size and mediastinal contours are within normal limits. Both lungs are clear. The visualized skeletal structures are unremarkable. IMPRESSION: No active cardiopulmonary disease. Electronically Signed   By: Inez Catalina M.D.   On: 07/18/2018 17:01    Procedures Procedures (including critical care time)  Medications Ordered in ED Medications - No data to display   Initial Impression / Assessment and Plan / ED Course  I have reviewed the triage vital signs and the nursing notes.  Pertinent labs & imaging results that were available during my care of the patient were reviewed by me and considered in my medical decision making (see chart for details).  48 year old male who presents for evaluation of dizziness.  Onset 11 AM today.  Has had multiple similar episodes over the last 2 months.  Has follow-up with PCP over the next week for reevaluation.  Patient states when these episodes occur it is usually when he does not eat for long periods of time.  Patient states he needs to eat every 4-5 hours.  Denies associated chest pain, shortness of breath, diaphoresis, slurred speech, headache, vision changes, unilateral weakness.  Patient does have history of hyperlipidemia, no history of hypertension, diabetes, family MI, CVA or TIA.  Denies syncope.   EKG without ischemic changes. Chest x-ray negative for infiltrates, CHF, pulmonary edema, pneumothorax.  CBC without leukocytosis, metabolic panel negative for electrolyte, renal or liver abnormalities. There is mild hyperglycemia at 124.  Delta troponin negative.  Orthostatics negative.  Discussed with patient and significant other cardiac vs neurologic etiologies. They do not want any imaging at this time to assess for possible CVA. Discussed risk vs benefit of not obtained imaging. Voice understanding and do  not want imaging at this time. Heart score 2-(risk factors, age) Wells criteria 0- PERC negative. Non focal neuro exam without neuro deficits.  Low suspicion for ACS, PE, dissection, CVA causing patient's symptoms.  VS stable throughout visit. No JVD or new murmur, RRR, breath sounds equal bilaterally,   Patient without arrhythmia or tachycardia while here in the department.  Patient without history of congestive heart failure, normal hematocrit, normal ECG, no shortness of breath and systolic blood pressure greater than 90; patient is low risk. Will plan for discharge home with close cardiology/PCP follow-up.  I discussed reasons to avoid driving until cardiology/PCP followup and other safety prevention including use of ladders and  working at [employer].  Patient is remained hemodynamically stable while in department.  I discussed return precautions.  Patient voiced understanding and is agreeable for follow-up.    Final Clinical Impressions(s) / ED Diagnoses   Final diagnoses:  Dizziness    ED Discharge Orders    None       Nelline Lio A, PA-C 07/19/18 Ileene Hutchinson, MD 07/19/18 1455

## 2018-07-22 ENCOUNTER — Encounter: Payer: Self-pay | Admitting: Medical

## 2018-07-22 ENCOUNTER — Ambulatory Visit (INDEPENDENT_AMBULATORY_CARE_PROVIDER_SITE_OTHER): Payer: BLUE CROSS/BLUE SHIELD | Admitting: Medical

## 2018-07-22 VITALS — BP 120/78 | HR 75 | Temp 98.2°F | Resp 16 | Ht 70.0 in | Wt 228.0 lb

## 2018-07-22 DIAGNOSIS — Z0000 Encounter for general adult medical examination without abnormal findings: Secondary | ICD-10-CM | POA: Diagnosis not present

## 2018-07-22 DIAGNOSIS — R42 Dizziness and giddiness: Secondary | ICD-10-CM | POA: Diagnosis not present

## 2018-07-22 DIAGNOSIS — R739 Hyperglycemia, unspecified: Secondary | ICD-10-CM | POA: Diagnosis not present

## 2018-07-22 DIAGNOSIS — F419 Anxiety disorder, unspecified: Secondary | ICD-10-CM | POA: Diagnosis not present

## 2018-07-22 LAB — COMPREHENSIVE METABOLIC PANEL
ALT: 20 U/L (ref 0–53)
AST: 16 U/L (ref 0–37)
Albumin: 4.9 g/dL (ref 3.5–5.2)
Alkaline Phosphatase: 54 U/L (ref 39–117)
BILIRUBIN TOTAL: 0.6 mg/dL (ref 0.2–1.2)
BUN: 14 mg/dL (ref 6–23)
CO2: 25 mEq/L (ref 19–32)
Calcium: 10.2 mg/dL (ref 8.4–10.5)
Chloride: 102 mEq/L (ref 96–112)
Creatinine, Ser: 1.03 mg/dL (ref 0.40–1.50)
GFR: 82.24 mL/min (ref >60.00)
GLUCOSE: 80 mg/dL (ref 70–99)
POTASSIUM: 4 meq/L (ref 3.5–5.1)
SODIUM: 137 meq/L (ref 135–145)
Total Protein: 7.5 g/dL (ref 6.0–8.3)

## 2018-07-22 LAB — POC URINALSYSI DIPSTICK (AUTOMATED)
BILIRUBIN UA: NEGATIVE
Glucose, UA: NEGATIVE
KETONES UA: NEGATIVE
Leukocytes, UA: NEGATIVE
Nitrite, UA: NEGATIVE
PH UA: 6 (ref 5.0–8.0)
Protein, UA: NEGATIVE
RBC UA: NEGATIVE
Spec Grav, UA: 1.015 (ref 1.010–1.025)
Urobilinogen, UA: 0.2 E.U./dL

## 2018-07-22 LAB — LIPID PANEL
Cholesterol: 174 mg/dL (ref 0–200)
HDL: 42.1 mg/dL (ref >39.00)
LDL Cholesterol: 93 mg/dL (ref 0–99)
NONHDL: 132.25
Total CHOL/HDL Ratio: 4
Triglycerides: 194 mg/dL — ABNORMAL HIGH (ref 0.0–149.0)
VLDL: 38.8 mg/dL (ref 0.0–40.0)

## 2018-07-22 LAB — CBC WITH DIFFERENTIAL/PLATELET
BASOS ABS: 0.1 10*3/uL (ref 0.0–0.1)
Basophils Relative: 0.7 % (ref 0.0–3.0)
Eosinophils Absolute: 0.1 10*3/uL (ref 0.0–0.7)
Eosinophils Relative: 0.8 % (ref 0.0–5.0)
HCT: 45.4 % (ref 39.0–52.0)
Hemoglobin: 15.1 g/dL (ref 13.0–17.0)
Lymphocytes Relative: 16.9 % (ref 12.0–46.0)
Lymphs Abs: 1.4 10*3/uL (ref 0.7–4.0)
MCHC: 33.4 g/dL (ref 30.0–36.0)
MCV: 84.9 fl (ref 78.0–100.0)
MONOS PCT: 6.5 % (ref 3.0–12.0)
Monocytes Absolute: 0.5 10*3/uL (ref 0.1–1.0)
NEUTROS ABS: 6.2 10*3/uL (ref 1.4–7.7)
Neutrophils Relative %: 75.1 % (ref 43.0–77.0)
PLATELETS: 336 10*3/uL (ref 150.0–400.0)
RBC: 5.35 Mil/uL (ref 4.22–5.81)
RDW: 13.4 % (ref 11.5–15.5)
WBC: 8.2 10*3/uL (ref 4.0–10.5)

## 2018-07-22 LAB — TSH: TSH: 1.35 u[IU]/mL (ref 0.35–4.50)

## 2018-07-22 LAB — HEMOGLOBIN A1C: HEMOGLOBIN A1C: 5.6 % (ref 4.6–6.5)

## 2018-07-22 MED ORDER — ALPRAZOLAM 0.5 MG PO TABS: 0.5000 mg | ORAL_TABLET | Freq: Every evening | ORAL | 0 refills | Status: DC | PRN

## 2018-07-22 MED ORDER — VENLAFAXINE HCL ER 37.5 MG PO CP24: 37.5000 mg | ORAL_CAPSULE | Freq: Every day | ORAL | 0 refills | Status: DC

## 2018-07-22 NOTE — Patient Instructions (Addendum)
For you wellness exam today I have ordered cbc, cmp, tsh, lipid panel, and ua  Vaccine up to date.  Recommend exercise and healthy diet.  We will let you know lab results as they come in.  Follow up date appointment will be determined after lab review.   You had negative I you had negative work-up the other day and emergency department for your dizziness and anxiety.  You mention some possible reflux symptoms around that time as well.  Troponin studies were negative and EKG was negative.  Your blood pressure came down after anxiety level decreased.  I do think it is best for you to go ahead and start Effexor low dose 37.5 mg daily.  I refill your Xanax and advising that you still use it very sparingly.  If you do get dizzy in the future I do want you to check your blood pressure to make sure that you are not getting abnormally high levels.  Your blood pressure is good presently.  He had some mild elevated sugars in the past and will check your A1c today.  Follow-up in 2 weeks or as needed.

## 2018-07-22 NOTE — Progress Notes (Signed)
Subjective:    Patient ID: Erik Cole, male    DOB: August 27, 1970, 48 y.o.   MRN: [id number]  HPI  Here for wellness but also addressed his anxiety, dizziness and possible reflux. See ros below. Reviewed ED.  Pt is fasting.   Up to date on vaccines.  Pt does not smoke and on review chart no etoh use.    Review of Systems  Constitutional: Negative for chills, fatigue and fever.  HENT: Negative for congestion, drooling, facial swelling, nosebleeds, postnasal drip, rhinorrhea and sinus pressure.   Respiratory: Negative for cough, chest tightness, shortness of breath and wheezing.   Cardiovascular: Negative for chest pain and palpitations.  Gastrointestinal: Positive for abdominal pain. Negative for abdominal distention, blood in stool, constipation, diarrhea and vomiting.       Some upper throat possible reflux symptoms while talking to triage nurse.  Genitourinary: Negative for decreased urine volume, dysuria, flank pain, frequency, hematuria, penile pain and penile swelling.  Musculoskeletal: Negative for back pain.  Neurological: Positive for dizziness. Negative for seizures, syncope and weakness.       Off on on for about a month or two. Pt thinks maybe related to anxiety. On Monday dizziness lasted for about an hour. Than got very anxious. Wife thinks he got panick attack. That day at work his bp was 165/11, 166/108,150/110, 161/101 and then 124/89.  He got stressed out event further after talking to triage nurse After he got ekg. And then better.  Pt states since Monday if feels anxious will get slight dizzy. But no gross motor or sensory function deficiets.  Hematological: Negative for adenopathy. Does not bruise/bleed easily.  Psychiatric/Behavioral: Negative for behavioral problems, dysphoric mood and suicidal ideas. The patient is nervous/anxious.        Hx of anxiety. State little more than usual.    Past Medical History:  Diagnosis Date  . Anxiety    mild, xanax  sparingly  . GERD (gastroesophageal reflux disease)   . History of chicken pox   . HLD (hyperlipidemia)    previous PCP stopped tricor 2/2 elevated LFTs 07/2010  . 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 Obesity   . Transaminitis    hx mild, thought fibrate related - w/u ~2008 neg HbsAg, Hep C, ANA, nl iron/ferritin, ceruloplasmin, a1-antitrypsin, normal [place]     Social History   Socioeconomic History  . Marital status: Married    Spouse name: Not on file  . Number of children: 2  . Years of education: college  . Highest education level: Not on file  Occupational History  . Occupation: Personal assistant: OTHER    Comment: [employer]  . Financial resource strain: Not on file  . Food insecurity:    Worry: Not on file    Inability: Not on file  . Transportation needs:    Medical: Not on file    Non-medical: Not on file  Tobacco Use  . Smoking status: Never Smoker  . Smokeless tobacco: Never Used  Substance and Sexual Activity  . Alcohol use: Yes    Alcohol/week: 0.0 standard drinks    Comment: Rare  . Drug use: No  . Sexual activity: Not on file  Lifestyle  . Physical activity:    Days per week: Not on file    Minutes per session: Not on file  . Stress: Not on file  Relationships  . Social connections:    Talks on phone: Not on file  Gets together: Not on file    Attends religious service: Not on file    Active member of club or organization: Not on file    Attends meetings of clubs or organizations: Not on file    Relationship status: Not on file  . Intimate partner violence:    Fear of current or ex partner: Not on file    Emotionally abused: Not on file    Physically abused: Not on file    Forced sexual activity: Not on file  Other Topics Concern  . Not on file  Social History Narrative   Caffeine: occasionally.   Lives with wife [name]), 2 children (son and daughter), 1 cat   Occupation:    Activity: no regular exercise    Diet: good water, daily fruits/vegetables, red meat rarely, fish 1x/wk, cut out fast food    Past Surgical History:  Procedure Laterality Date  . COLONOSCOPY  10/2015   one polyp, diverticulosis, rpt 5 yrs ([facility])  . GANGLION CYST EXCISION Right 2008    wrist    Family History  Problem Relation Age of Onset  . Hypertension Mother   . Hyperlipidemia Father        TG  . Diverticulitis Father   . Diabetes Maternal Grandmother   . Coronary artery disease Paternal Grandfather 55       unhealthy  . Breast cancer Other 2       great grandmother, maternal  . Stroke Neg Hx   . Colon cancer Neg Hx   . Colon polyps Neg Hx     No Active Allergies  Current Outpatient Medications on File Prior to Visit  Medication Sig Dispense Refill  . ALPRAZolam (XANAX) 0.5 MG tablet Take 1 tablet (0.5 mg total) by mouth at bedtime as needed for anxiety. 15 tablet 0  . atorvastatin (LIPITOR) 10 MG tablet TAKE 1 TABLET DAILY 90 tablet 3  . cetirizine (ZYRTEC) 10 MG tablet Take 10 mg by mouth as needed for allergies. Reported on 10/31/2015    . fenofibrate micronized (ANTARA) 130 MG capsule Take 1 capsule (130 mg total) by mouth daily. 90 capsule 3  . omeprazole (PRILOSEC) 20 MG capsule Take 1 capsule (20 mg total) by mouth daily as needed.     No current facility-administered medications on file prior to visit.     BP 114/85   Pulse 75   Temp 98.2 F (36.8 C) (Oral)   Resp 16   Ht 5\' 10"  (1.778 m)   Wt 228 lb (103.4 kg)   SpO2 99%   BMI 32.71 kg/m        Objective:   Physical Exam  General Mental Status- Alert. General Appearance- Not in acute distress.   Skin General: Color- Normal Color. Moisture- Normal Moisture. No worrisome lesions.  Neck Carotid Arteries- Normal color. Moisture- Normal Moisture. No carotid bruits. No JVD.  Chest and Lung Exam Auscultation: Breath Sounds:-Normal.  Cardiovascular Auscultation:Rythm- Regular. Murmurs & Other Heart Sounds:Auscultation  of the heart reveals- No Murmurs.  Abdomen Inspection:-Inspeection Normal. Palpation/Percussion:Note:No mass. Palpation and Percussion of the abdomen reveal- Non Tender, Non Distended + BS, no rebound or guarding.   Neurologic Cranial Nerve exam:- CN III-XII intact(No nystagmus), symmetric smile. Drift Test:- No drift. Romberg Exam:- Negative.  Heal to Toe Gait exam:-Normal. Finger to Nose:- Normal/Intact Strength:- 5/5 equal and symmetric strength both upper and lower extremities.      Assessment & Plan:  For you wellness exam today I have ordered cbc, cmp,  tsh, lipid panel, and ua  Vaccine up to date.  Recommend exercise and healthy diet.  We will let you know lab results as they come in.  Follow up date appointment will be determined after lab review.   You had negative I you had negative work-up the other day and emergency department for your dizziness and anxiety.  You mention some possible reflux symptoms around that time as well.  Troponin studies were negative and EKG was negative.  Your blood pressure came down after anxiety level decreased.  I do think it is best for you to go ahead and start Effexor low dose 37.5 mg daily.  I refill your Xanax and advising that you still use it very sparingly.  If you do get dizzy in the future I do want you to check your blood pressure to make sure that you are not getting abnormally high levels.  Your blood pressure is good presently.  He had some mild elevated sugars in the past and will check your A1c today.  Follow-up in 2 weeks or as needed.  Patient has scheduled physical exam but he also had emergency department visit the other day.  Therefore I did address reasons for the ED visit.  I will chart him for this apart from the complete physical exam.  I did not want for him to have still come back on other day for the physicals.

## 2018-08-12 ENCOUNTER — Encounter: Payer: Self-pay | Admitting: Medical

## 2018-08-12 ENCOUNTER — Ambulatory Visit: Payer: BLUE CROSS/BLUE SHIELD | Admitting: Medical

## 2018-08-12 VITALS — BP 118/81 | HR 73 | Temp 97.6°F | Resp 16 | Ht 72.0 in | Wt 228.4 lb

## 2018-08-12 DIAGNOSIS — J309 Allergic rhinitis, unspecified: Secondary | ICD-10-CM | POA: Diagnosis not present

## 2018-08-12 DIAGNOSIS — F419 Anxiety disorder, unspecified: Secondary | ICD-10-CM | POA: Diagnosis not present

## 2018-08-12 MED ORDER — FLUTICASONE PROPIONATE 50 MCG/ACT NA SUSP: 2.0000 | Freq: Every day | NASAL | 1 refills | Status: DC

## 2018-08-12 NOTE — Patient Instructions (Addendum)
Your anxiety is some better with low dose effexor. Will stay on same dose and could increase dose if you want. Higher dose may be better and help you rely less on xanax.  Continue xanax for severe anxiety or panic attacks.  Continue with counseling and recommend getting some exercise 4-5 times a week.  Give [place] update when you are about to run out of effexor and let me know if you want to increase dose.  For mild allergy symptoms and nasal congestion continue zyrtec. Can add flonase if needed  Follow up in 3-4 months or as needed

## 2018-08-12 NOTE — Progress Notes (Signed)
Subjective:    Patient ID: Erik Cole, male    DOB: [birth year], 48 y.o.   MRN: [id number]  HPI  Pt in for follow up.   I did prescribe effexor. Gave low dose. He had severe panic attack after one week. He took xanax to stop panic attack. Felt better overall after being on effexor for one week.  2nd week had to take 4 xanax  tabs over the week. Then past week just took 2 tab over one week.  Pt feels like effexor is starting to take effect.  Last xanax script I gave him was 15 tabs. Has about 8 tabs left.  Panic attacks seem to be work related.  Pt is gong to counselor to figure out techniques to handle anxiety. Also thinking of making job change.   Review of Systems  Constitutional: Negative for chills, fatigue and fever.  HENT: Positive for congestion.        Restarted used zyrtec for mild sinus pressure. It seemed to help.  Respiratory: Negative for chest tightness, shortness of breath and wheezing.   Cardiovascular: Negative for chest pain and palpitations.  Gastrointestinal: Negative for abdominal pain.  Musculoskeletal: Negative for back pain and myalgias.  Skin: Negative for rash.  Neurological: Negative for dizziness, tremors, speech difficulty, weakness, numbness and headaches.  Hematological: Negative for adenopathy. Does not bruise/bleed easily.  Psychiatric/Behavioral: Negative for behavioral problems, dysphoric mood, sleep disturbance and suicidal ideas. The patient is nervous/anxious.    Past Medical History:  Diagnosis Date  . Anxiety    mild, xanax sparingly  . GERD (gastroesophageal reflux disease)   . History of chicken pox   . HLD (hyperlipidemia)    previous PCP stopped tricor 2/2 elevated LFTs 07/2010  . 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 Obesity   . Transaminitis    hx mild, thought fibrate related - w/u ~2008 neg HbsAg, Hep C, ANA, nl iron/ferritin, ceruloplasmin, a1-antitrypsin, normal [place]     Social History   Socioeconomic  History  . Marital status: Married    Spouse name: Not on file  . Number of children: 2  . Years of education: college  . Highest education level: Not on file  Occupational History  . Occupation: Personal assistant: OTHER    Comment: [employer]  . Financial resource strain: Not on file  . Food insecurity:    Worry: Not on file    Inability: Not on file  . Transportation needs:    Medical: Not on file    Non-medical: Not on file  Tobacco Use  . Smoking status: Never Smoker  . Smokeless tobacco: Never Used  Substance and Sexual Activity  . Alcohol use: Yes    Alcohol/week: 0.0 standard drinks    Comment: Rare  . Drug use: No  . Sexual activity: Not on file  Lifestyle  . Physical activity:    Days per week: Not on file    Minutes per session: Not on file  . Stress: Not on file  Relationships  . Social connections:    Talks on phone: Not on file    Gets together: Not on file    Attends religious service: Not on file    Active member of club or organization: Not on file    Attends meetings of clubs or organizations: Not on file    Relationship status: Not on file  . Intimate partner violence:    Fear of current or ex partner:  Not on file    Emotionally abused: Not on file    Physically abused: Not on file    Forced sexual activity: Not on file  Other Topics Concern  . Not on file  Social History Narrative   Caffeine: occasionally.   Lives with wife [name]), 2 children (son and daughter), 1 cat   Occupation:    Activity: no regular exercise   Diet: good water, daily fruits/vegetables, red meat rarely, fish 1x/wk, cut out fast food    Past Surgical History:  Procedure Laterality Date  . COLONOSCOPY  10/2015   one polyp, diverticulosis, rpt 5 yrs ([facility])  . GANGLION CYST EXCISION Right 2008    wrist    Family History  Problem Relation Age of Onset  . Hypertension Mother   . Hyperlipidemia Father        TG  . Diverticulitis Father     . Diabetes Maternal Grandmother   . Coronary artery disease Paternal Grandfather 55       unhealthy  . Breast cancer Other 30       great grandmother, maternal  . Stroke Neg Hx   . Colon cancer Neg Hx   . Colon polyps Neg Hx     No Active Allergies  Current Outpatient Medications on File Prior to Visit  Medication Sig Dispense Refill  . ALPRAZolam (XANAX) 0.5 MG tablet Take 1 tablet (0.5 mg total) by mouth at bedtime as needed for anxiety. 15 tablet 0  . atorvastatin (LIPITOR) 10 MG tablet TAKE 1 TABLET DAILY 90 tablet 3  . cetirizine (ZYRTEC) 10 MG tablet Take 10 mg by mouth as needed for allergies. Reported on 10/31/2015    . fenofibrate micronized (ANTARA) 130 MG capsule Take 1 capsule (130 mg total) by mouth daily. 90 capsule 3  . omeprazole (PRILOSEC) 20 MG capsule Take 1 capsule (20 mg total) by mouth daily as needed.    . venlafaxine XR (EFFEXOR XR) 37.5 MG 24 hr capsule Take 1 capsule (37.5 mg total) by mouth daily with breakfast. 30 capsule 0   No current facility-administered medications on file prior to visit.     BP 118/81   Pulse 73   Temp 97.6 F (36.4 C) (Oral)   Resp 16   Ht 6' (1.829 m)   Wt 228 lb 6.4 oz (103.6 kg)   SpO2 100%   BMI 30.98 kg/m       Objective:   Physical Exam   General Mental Status- Alert. General Appearance- Not in acute distress.   Skin General: Color- Normal Color. Moisture- Normal Moisture.  Neck Carotid Arteries- Normal color. Moisture- Normal Moisture. No carotid bruits. No JVD.  Chest and Lung Exam Auscultation: Breath Sounds:-Normal.  Cardiovascular Auscultation:Rythm- Regular. Murmurs & Other Heart Sounds:Auscultation of the heart reveals- No Murmurs.  Neurologic Cranial Nerve exam:- CN III-XII intact(No nystagmus), symmetric smile. Strength:- 5/5 equal and symmetric strength both upper and lower extremities.       Assessment & Plan:  Your anxiety is some better with low dose effexor. Will stay on same  dose and could increase dose if you want. Higher dose may be better and help you rely less on xanax.  Continue xanax for severe anxiety or panic attacks.  Continue with counseling and recommend getting some exercise 4-5 times a week.  Give [place] update when you are about to run out of effexor and let me know if you want to increase dose.  For mild allergy symptoms and nasal congestion  continue zyrtec. Can add flonase if needed  Follow up in 3-4 months or as needed  25 minutes spent with pt. 50% of time spent counseling on med plan going forward.  Mackie Pai, PA-C

## 2018-08-15 ENCOUNTER — Encounter: Payer: Self-pay | Admitting: Medical

## 2018-08-15 ENCOUNTER — Telehealth: Payer: Self-pay | Admitting: Medical

## 2018-08-15 MED ORDER — VENLAFAXINE HCL ER 75 MG PO CP24: 75.0000 mg | ORAL_CAPSULE | Freq: Every day | ORAL | 0 refills | Status: DC

## 2018-08-15 NOTE — Telephone Encounter (Signed)
Rx effexor higher dose sent to pt pharmacy.

## 2018-09-05 ENCOUNTER — Other Ambulatory Visit: Payer: Self-pay | Admitting: Medical

## 2018-11-01 ENCOUNTER — Encounter: Payer: Self-pay | Admitting: Medical

## 2018-11-01 ENCOUNTER — Ambulatory Visit (INDEPENDENT_AMBULATORY_CARE_PROVIDER_SITE_OTHER): Payer: BLUE CROSS/BLUE SHIELD | Admitting: Medical

## 2018-11-01 ENCOUNTER — Other Ambulatory Visit: Payer: Self-pay

## 2018-11-01 DIAGNOSIS — K5792 Diverticulitis of intestine, part unspecified, without perforation or abscess without bleeding: Secondary | ICD-10-CM

## 2018-11-01 MED ORDER — CIPROFLOXACIN HCL 500 MG PO TABS: 500.0000 mg | ORAL_TABLET | Freq: Two times a day (BID) | ORAL | 0 refills | Status: DC

## 2018-11-01 MED ORDER — METRONIDAZOLE 500 MG PO TABS: 500.0000 mg | ORAL_TABLET | Freq: Three times a day (TID) | ORAL | 0 refills | Status: DC

## 2018-11-01 NOTE — Patient Instructions (Addendum)
By pt  history, recent symptoms and his self exam on today virtual visit appears to have probable early diverticulitis. Will start cipro and flagyl. Counseled expect that he progressively gets better but if not improved/worse by Friday let us know in that event would recommend cbc and consider imaging study.  Rx zofran for nausea or vomiting.  Otherwise if getting better would like update in 7 days if he feels back to normal. If any residual sympotms at 7 days would like extend antbiotic course.

## 2018-11-01 NOTE — Progress Notes (Signed)
   Subjective:    Patient ID: Erik Cole, male    DOB: July 15, 1970, 48 y.o.   MRN: [id number]  HPI  Virtual Visit via Video Note  I connected with Erik Cole on 11/01/18 at  8:40 AM EDT by a video enabled telemedicine application and verified that I am speaking with the correct person using two identifiers.   I discussed the limitations of evaluation and management by telemedicine and the availability of in person appointments. The patient expressed understanding and agreed to proceed.  No vitals taken by pt.  History of Present Illness:  Pt states yesterday and Saturday he felt some pain in abdomen which he states felt like diverticulitis. Mild pain but last night pain was moderate to severe. He states mild bloated feeling overall but llq are feels like epicenter. Pain is constant. Pain little better when it was last not. Pt last bm was last night and this morning. Decent bm in size but no diarrhea or constipation. No cva pain. No testicle pain.  Pt thinks diverticulitis. No fever reported. This morning felt mild chill very brief.  2017 CT. IMPRESSION: Findings compatible with acute sigmoid diverticulitis. No evidence for perforation. Recommend correlation with colonoscopy if not previously performed upon resolution of the acute symptomatology.  Fat containing right inguinal hernia.   Observations/Objective: No acute distress. Moderate diffuse abd pain. More in left lower mild tender presenting.   Assessment and Plan: By pt  history, recent symptoms and his self exam on today virtual visit appears to have probable early diverticulitis. Will start cipro and flagyl. Counseled expect that he progressively gets better but if not improved/worse by Friday let us know in that event would recommend cbc and consider imaging study.  Otherwise if getting better would like update in 7 days if he feels back to normal. If any residual sympotms at 7 days would like extend antbiotic  course.  Mackie Pai, PA-C  Follow Up Instructions:    I discussed the assessment and treatment plan with the patient. The patient was provided an opportunity to ask questions and all were answered. The patient agreed with the plan and demonstrated an understanding of the instructions.   The patient was advised to call back or seek an in-person evaluation if the symptoms worsen or if the condition fails to improve as anticipated.     Mackie Pai, PA-C    Review of Systems  Constitutional: Positive for chills. Negative for fatigue and fever.       This am only.  Respiratory: Negative for cough, chest tightness, shortness of breath and wheezing.   Cardiovascular: Negative for chest pain and palpitations.  Gastrointestinal: Positive for abdominal pain and nausea. Negative for abdominal distention, blood in stool, constipation, diarrhea, rectal pain and vomiting.  Musculoskeletal: Negative for back pain.  Skin: Negative for rash.  Neurological: Negative for dizziness, tremors, weakness and headaches.  Psychiatric/Behavioral: Negative for behavioral problems. The patient is not nervous/anxious.        Objective:   Physical Exam  See objective.      Assessment & Plan:

## 2018-11-04 ENCOUNTER — Encounter: Payer: Self-pay | Admitting: Medical

## 2018-11-06 ENCOUNTER — Other Ambulatory Visit: Payer: Self-pay | Admitting: Medical

## 2018-11-07 MED ORDER — VENLAFAXINE HCL ER 75 MG PO CP24: 75.0000 mg | ORAL_CAPSULE | Freq: Every day | ORAL | 0 refills | Status: DC

## 2018-11-09 ENCOUNTER — Encounter: Payer: Self-pay | Admitting: Medical

## 2018-11-11 ENCOUNTER — Other Ambulatory Visit: Payer: Self-pay

## 2018-11-11 ENCOUNTER — Ambulatory Visit (INDEPENDENT_AMBULATORY_CARE_PROVIDER_SITE_OTHER): Payer: BLUE CROSS/BLUE SHIELD | Admitting: Medical

## 2018-11-11 ENCOUNTER — Encounter: Payer: Self-pay | Admitting: Medical

## 2018-11-11 VITALS — Wt 220.0 lb

## 2018-11-11 DIAGNOSIS — R739 Hyperglycemia, unspecified: Secondary | ICD-10-CM

## 2018-11-11 DIAGNOSIS — F419 Anxiety disorder, unspecified: Secondary | ICD-10-CM

## 2018-11-11 DIAGNOSIS — K5792 Diverticulitis of intestine, part unspecified, without perforation or abscess without bleeding: Secondary | ICD-10-CM | POA: Diagnosis not present

## 2018-11-11 DIAGNOSIS — R350 Frequency of micturition: Secondary | ICD-10-CM | POA: Diagnosis not present

## 2018-11-11 DIAGNOSIS — K635 Polyp of colon: Secondary | ICD-10-CM | POA: Diagnosis not present

## 2018-11-11 DIAGNOSIS — E785 Hyperlipidemia, unspecified: Secondary | ICD-10-CM

## 2018-11-11 NOTE — Progress Notes (Signed)
Subjective:    Patient ID: Erik Cole, male    DOB: February 24, 1971, 48 y.o.   MRN: [id number]  HPI Virtual Visit via Video Note  I connected with Erik Cole on 11/11/18 at  8:00 AM EDT by a video enabled telemedicine application and verified that I am speaking with the correct person using two identifiers.  Location: Patient: home Provider: office.   I discussed the limitations of evaluation and management by telemedicine and the availability of in person appointments. The patient expressed understanding and agreed to proceed.  History of Present Illness:  Pt states he feels well. He states pain in abdomen did improve significantly after one day. Severe pain stopped. Then gradually pain decreased. By Thursday/3 days after meds pain went away left of navel. 1st day had more constant diffuse pain.  No pain now. No fever, no chills or sweats.  Also he reports some slight frequency of urination. Pt state this has been going on for several weeks.  Some perineum region pain. Urge to urinate. He states past week he did not notice symptoms while on cipro.  Pt has been doing well with effexor. Has helped with anxiety. Doing well with 75 mg dose.   Observations/Objective: No acute distress. Pleasant. Abdomen pain- no pain on self palpation.  Assessment and Plan: Patient reports that he is doing very well with abdomen pain.  It is resolved now completely.  Appears to be better from probable diverticulitis.  Responded to Cipro and Flagyl.  Patient has history of colon polyp and did go ahead and make referral to his GI to see if he has a repeat colonoscopy this year which would be 3 years.  Possible it was recommended for 5 years?  We will find out notify patient.  Patient reports some frequent urination recently before he had abdomen pain.  He also reported some perineal region pain.  He notes that over the last week the symptoms have resolved.  It is possible that he had some  prostatitis and Cipro treated this.  I do think is a good idea to go ahead and get PSA, UA and urine culture.  He will give that urine sample and get blood work next week.  He also has history of high triglycerides and he will get a lipid panel next week.  In addition mild high sugars in the past and will get A1c.  Follow-up date to be determined after lab review.  25 minutes spent with patient.  50% of time spent counseling on treatment plan going forward for each condition.  Follow Up Instructions:    I discussed the assessment and treatment plan with the patient. The patient was provided an opportunity to ask questions and all were answered. The patient agreed with the plan and demonstrated an understanding of the instructions.   The patient was advised to call back or seek an in-person evaluation if the symptoms worsen or if the condition fails to improve as anticipated.    Mackie Pai, PA-C    Review of Systems  Constitutional: Negative for chills, fatigue and fever.  Respiratory: Negative for chest tightness, shortness of breath and wheezing.   Cardiovascular: Negative for chest pain and palpitations.  Gastrointestinal: Negative for abdominal pain, blood in stool, constipation, diarrhea, nausea and vomiting.  Genitourinary: Negative for dysuria.  Musculoskeletal: Negative for back pain.  Skin: Negative for rash.  Neurological: Negative for dizziness and headaches.  Hematological: Negative for adenopathy. Does not bruise/bleed easily.  Psychiatric/Behavioral: Negative for behavioral  problems and confusion.       Objective:   Physical Exam        Assessment & Plan:

## 2018-11-11 NOTE — Patient Instructions (Signed)
Patient reports that he is doing very well with abdomen pain.  It is resolved now completely.  Appears to be better from probable diverticulitis.  Responded to Cipro and Flagyl.  Patient has history of colon polyp and did go ahead and make referral to his GI to see if he has a repeat colonoscopy this year which would be 3 years.  Possible it was recommended for 5 years?  We will find out notify patient.  Patient reports some frequent urination recently before he had abdomen pain.  He also reported some perineal region pain.  He notes that over the last week the symptoms have resolved.  It is possible that he had some prostatitis and Cipro treated this.  I do think is a good idea to go ahead and get PSA, UA and urine culture.  He will give that urine sample and get blood work next week.  He also has history of high triglycerides and he will get a lipid panel next week.  In addition mild high sugars in the past and will get A1c.  Follow-up date to be determined after lab review.

## 2018-11-16 ENCOUNTER — Other Ambulatory Visit: Payer: Self-pay

## 2018-11-16 ENCOUNTER — Other Ambulatory Visit (INDEPENDENT_AMBULATORY_CARE_PROVIDER_SITE_OTHER): Payer: BLUE CROSS/BLUE SHIELD

## 2018-11-16 DIAGNOSIS — R739 Hyperglycemia, unspecified: Secondary | ICD-10-CM | POA: Diagnosis not present

## 2018-11-16 DIAGNOSIS — E785 Hyperlipidemia, unspecified: Secondary | ICD-10-CM

## 2018-11-16 DIAGNOSIS — R350 Frequency of micturition: Secondary | ICD-10-CM

## 2018-11-16 LAB — LIPID PANEL
Cholesterol: 170 mg/dL (ref 0–200)
HDL: 41.6 mg/dL (ref >39.00)
LDL Cholesterol: 93 mg/dL (ref 0–99)
NonHDL: 128.81
Total CHOL/HDL Ratio: 4
Triglycerides: 178 mg/dL — ABNORMAL HIGH (ref 0.0–149.0)
VLDL: 35.6 mg/dL (ref 0.0–40.0)

## 2018-11-16 LAB — HEMOGLOBIN A1C: Hgb A1c MFr Bld: 5.8 % (ref 4.6–6.5)

## 2018-11-16 LAB — PSA: PSA: 1.16 ng/mL (ref 0.10–4.00)

## 2018-11-19 ENCOUNTER — Other Ambulatory Visit: Payer: Self-pay | Admitting: Medical

## 2018-11-29 ENCOUNTER — Other Ambulatory Visit: Payer: Self-pay | Admitting: Medical

## 2019-01-03 ENCOUNTER — Other Ambulatory Visit: Payer: Self-pay | Admitting: Medical

## 2019-01-04 MED ORDER — ALPRAZOLAM 0.5 MG PO TABS: 0.5000 mg | ORAL_TABLET | Freq: Every evening | ORAL | 0 refills | Status: DC | PRN

## 2019-01-04 NOTE — Telephone Encounter (Signed)
Rx xanax refilled. Up to date on uds, contract and control med site review.

## 2019-01-15 ENCOUNTER — Other Ambulatory Visit: Payer: Self-pay | Admitting: Medical

## 2019-05-28 ENCOUNTER — Other Ambulatory Visit: Payer: Self-pay | Admitting: Medical

## 2019-06-23 ENCOUNTER — Other Ambulatory Visit: Payer: Self-pay | Admitting: Medical

## 2019-06-26 MED ORDER — ALPRAZOLAM 0.5 MG PO TABS: 0.5000 mg | ORAL_TABLET | Freq: Every evening | ORAL | 0 refills | Status: DC | PRN

## 2019-06-26 NOTE — Telephone Encounter (Signed)
He uses xnax very sparingly. About 15 tabs every 6 months. So for him have not required contract. But noticed he has not been seen for a while. No cpe done. Would you offer him wellness exam.   Refilled 15 tab rx of xanax.

## 2019-06-26 NOTE — Telephone Encounter (Signed)
Last written: 01/04/19 Last ov: 11/11/18 Next ov: nothing schedules Contract:due UDS: due

## 2019-12-10 ENCOUNTER — Encounter: Payer: Self-pay | Admitting: Medical

## 2019-12-13 ENCOUNTER — Other Ambulatory Visit: Payer: Self-pay

## 2019-12-14 ENCOUNTER — Encounter: Payer: Self-pay | Admitting: Medical

## 2019-12-14 ENCOUNTER — Ambulatory Visit (INDEPENDENT_AMBULATORY_CARE_PROVIDER_SITE_OTHER): Payer: BC Managed Care – PPO | Admitting: Medical

## 2019-12-14 VITALS — BP 124/84 | HR 78 | Resp 18 | Ht 72.0 in | Wt 239.6 lb

## 2019-12-14 DIAGNOSIS — Z0000 Encounter for general adult medical examination without abnormal findings: Secondary | ICD-10-CM

## 2019-12-14 DIAGNOSIS — Z125 Encounter for screening for malignant neoplasm of prostate: Secondary | ICD-10-CM | POA: Diagnosis not present

## 2019-12-14 DIAGNOSIS — R369 Urethral discharge, unspecified: Secondary | ICD-10-CM

## 2019-12-14 MED ORDER — CIPROFLOXACIN HCL 500 MG PO TABS: 500.0000 mg | ORAL_TABLET | Freq: Two times a day (BID) | ORAL | 0 refills | Status: DC

## 2019-12-14 MED ORDER — METRONIDAZOLE 500 MG PO TABS: 500.0000 mg | ORAL_TABLET | Freq: Three times a day (TID) | ORAL | 0 refills | Status: DC

## 2019-12-14 NOTE — Patient Instructions (Addendum)
For you wellness exam today I have ordered cbc, cmp and lipid panel  Will get tdap.later. Deferred today.  Recommend exercise and healthy diet.  We will let you know lab results as they come in.  Follow up date appointment will be determined after lab review.   No abd/pelvis pain today. Will make cipro and flagyl available due to hx of diverticulitis. Use if pain comes back. If using and pain not improving then may need ct/abd pelvis and be referred back to gi.   Preventive Care 22-83 Years Old, Male Preventive care refers to lifestyle choices and visits with your health care provider that can promote health and wellness. This includes:  A yearly physical exam. This is also called an annual well check.  Regular dental and eye exams.  Immunizations.  Screening for certain conditions.  Healthy lifestyle choices, such as eating a healthy diet, getting regular exercise, not using drugs or products that contain nicotine and tobacco, and limiting alcohol use. What can I expect for my preventive care visit? Physical exam Your health care provider will check:  Height and weight. These may be used to calculate body mass index (BMI), which is a measurement that tells if you are at a healthy weight.  Heart rate and blood pressure.  Your skin for abnormal spots. Counseling Your health care provider may ask you questions about:  Alcohol, tobacco, and drug use.  Emotional well-being.  Home and relationship well-being.  Sexual activity.  Eating habits.  Work and work Statistician. What immunizations do I need?  Influenza (flu) vaccine  This is recommended every year. Tetanus, diphtheria, and pertussis (Tdap) vaccine  You may need a Td booster every 10 years. Varicella (chickenpox) vaccine  You may need this vaccine if you have not already been vaccinated. Zoster (shingles) vaccine  You may need this after age 42. Measles, mumps, and rubella (MMR) vaccine  You may need  at least one dose of MMR if you were born in 1957 or later. You may also need a second dose. Pneumococcal conjugate (PCV13) vaccine  You may need this if you have certain conditions and were not previously vaccinated. Pneumococcal polysaccharide (PPSV23) vaccine  You may need one or two doses if you smoke cigarettes or if you have certain conditions. Meningococcal conjugate (MenACWY) vaccine  You may need this if you have certain conditions. Hepatitis A vaccine  You may need this if you have certain conditions or if you travel or work in places where you may be exposed to hepatitis A. Hepatitis B vaccine  You may need this if you have certain conditions or if you travel or work in places where you may be exposed to hepatitis B. Haemophilus influenzae type b (Hib) vaccine  You may need this if you have certain risk factors. Human papillomavirus (HPV) vaccine  If recommended by your health care provider, you may need three doses over 6 months. You may receive vaccines as individual doses or as more than one vaccine together in one shot (combination vaccines). Talk with your health care provider about the risks and benefits of combination vaccines. What tests do I need? Blood tests  Lipid and cholesterol levels. These may be checked every 5 years, or more frequently if you are over 25 years old.  Hepatitis C test.  Hepatitis B test. Screening  Lung cancer screening. You may have this screening every year starting at age 27 if you have a 30-pack-year history of smoking and currently smoke or have quit  within the past 15 years.  Prostate cancer screening. Recommendations will vary depending on your family history and other risks.  Colorectal cancer screening. All adults should have this screening starting at age 37 and continuing until age 27. Your health care provider may recommend screening at age 47 if you are at increased risk. You will have tests every 1-10 years, depending on  your results and the type of screening test.  Diabetes screening. This is done by checking your blood sugar (glucose) after you have not eaten for a while (fasting). You may have this done every 1-3 years.  Sexually transmitted disease (STD) testing. Follow these instructions at home: Eating and drinking  Eat a diet that includes fresh fruits and vegetables, whole grains, lean protein, and low-fat dairy products.  Take vitamin and mineral supplements as recommended by your health care provider.  Do not drink alcohol if your health care provider tells you not to drink.  If you drink alcohol: ? Limit how much you have to 0-2 drinks a day. ? Be aware of how much alcohol is in your drink. In the [place] one drink equals one 12 oz bottle of beer (355 mL), one 5 oz glass of wine (148 mL), or one 1 oz glass of hard liquor (44 mL). Lifestyle  Take daily care of your teeth and gums.  Stay active. Exercise for at least 30 minutes on 5 or more days each week.  Do not use any products that contain nicotine or tobacco, such as cigarettes, e-cigarettes, and chewing tobacco. If you need help quitting, ask your health care provider.  If you are sexually active, practice safe sex. Use a condom or other form of protection to prevent STIs (sexually transmitted infections).  Talk with your health care provider about taking a low-dose aspirin every day starting at age 37. What's next?  Go to your health care provider once a year for a well check visit.  Ask your health care provider how often you should have your eyes and teeth checked.  Stay up to date on all vaccines. This information is not intended to replace advice given to you by your health care provider. Make sure you discuss any questions you have with your health care provider. Document Revised: 06/23/2018 Document Reviewed: 06/23/2018 Elsevier Patient Education  2020 [employer].

## 2019-12-14 NOTE — Progress Notes (Signed)
Subjective:    Patient ID: Erik Cole, male    DOB: 1971-03-15, 49 y.o.   MRN: [id number]  HPI  Pt is here for wellness exam.   Pt is not fasting,  Pt has not been exercising. Pt states good diet except if has to travel. 1-2 cups coffee a day. No alcohol. Not smoking.  Pt has not got covid vaccine yet but can at work.    Review of Systems  Constitutional: Negative for chills, fatigue and fever.  Respiratory: Negative for cough, chest tightness, shortness of breath and wheezing.   Cardiovascular: Negative for chest pain and palpitations.  Gastrointestinal: Negative for abdominal pain, constipation, nausea, rectal pain and vomiting.       Saturday and Sunday slight mild left lower quadrant discomfort. Like he need to have bm. Maybe chills on Saturday. No fever. No sweats. Hx of diverticultis.   Along with this some rt testicle warmth. Not left. In past when had diverticultis he states with antibiotics testilce discomfort.   One time in past he had odd testicle wamth sensation when had diverticulutis.  Pain in llq and testicle area gone by wed evening.   2017 colonoscopy done. Told repeat in 5 years.  Genitourinary: Negative for dysuria and frequency.  Musculoskeletal: Negative for back pain.  Skin: Negative for rash.  Neurological: Negative for dizziness, light-headedness and headaches.  Hematological: Negative for adenopathy. Does not bruise/bleed easily.  Psychiatric/Behavioral: Negative for behavioral problems and sleep disturbance. The patient is not nervous/anxious.     Past Medical History:  Diagnosis Date  . Anxiety    mild, xanax sparingly  . GERD (gastroesophageal reflux disease)   . History of chicken pox   . HLD (hyperlipidemia)    previous PCP stopped tricor 2/2 elevated LFTs 07/2010  . 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 Obesity   . Transaminitis    hx mild, thought fibrate related - w/u ~2008 neg HbsAg, Hep C, ANA, nl iron/ferritin,  ceruloplasmin, a1-antitrypsin, normal [place]     Social History   Socioeconomic History  . Marital status: Married    Spouse name: Not on file  . Number of children: 2  . Years of education: college  . Highest education level: Not on file  Occupational History  . Occupation: Personal assistant: [employer]    Comment: [employer]  Tobacco Use  . Smoking status: Never Smoker  . Smokeless tobacco: Never Used  Substance and Sexual Activity  . Alcohol use: Yes    Alcohol/week: 0.0 standard drinks    Comment: Rare  . Drug use: No  . Sexual activity: Not on file  Other Topics Concern  . Not on file  Social History Narrative   Caffeine: occasionally.   Lives with wife [name]), 2 children (son and daughter), 1 cat   Occupation:    Activity: no regular exercise   Diet: good water, daily fruits/vegetables, red meat rarely, fish 1x/wk, cut out fast food   Social Determinants of Health   Financial Resource Strain:   . Difficulty of Paying Living Expenses:   Food Insecurity:   . Worried About Charity fundraiser in the Last Year:   . Arboriculturist in the Last Year:   Transportation Needs:   . Film/video editor (Medical):   Marland Kitchen Lack of Transportation (Non-Medical):   Physical Activity:   . Days of Exercise per Week:   . Minutes of Exercise per Session:   Stress:   .  Feeling of Stress :   Social Connections:   . Frequency of Communication with Friends and Family:   . Frequency of Social Gatherings with Friends and Family:   . Attends Religious Services:   . Active Member of Clubs or Organizations:   . Attends Archivist Meetings:   Marland Kitchen Marital Status:   Intimate Partner Violence:   . Fear of Current or Ex-Partner:   . Emotionally Abused:   Marland Kitchen Physically Abused:   . Sexually Abused:     Past Surgical History:  Procedure Laterality Date  . COLONOSCOPY  10/2015   one polyp, diverticulosis, rpt 5 yrs ([facility])  . GANGLION CYST EXCISION Right 2008    wrist     Family History  Problem Relation Age of Onset  . Hypertension Mother   . Hyperlipidemia Father        TG  . Diverticulitis Father   . Diabetes Maternal Grandmother   . Coronary artery disease Paternal Grandfather 55       unhealthy  . Breast cancer Other 52       great grandmother, maternal  . Stroke Neg Hx   . Colon cancer Neg Hx   . Colon polyps Neg Hx     No Active Allergies  Current Outpatient Medications on File Prior to Visit  Medication Sig Dispense Refill  . ALPRAZolam (XANAX) 0.5 MG tablet Take 1 tablet (0.5 mg total) by mouth at bedtime as needed for anxiety. 15 tablet 0  . atorvastatin (LIPITOR) 10 MG tablet TAKE 1 TABLET DAILY 90 tablet 4  . cetirizine (ZYRTEC) 10 MG tablet Take 10 mg by mouth as needed for allergies. Reported on 10/31/2015    . fenofibrate micronized (ANTARA) 130 MG capsule TAKE 1 CAPSULE DAILY 90 capsule 3  . fluticasone (FLONASE) 50 MCG/ACT nasal spray SPRAY 2 SPRAYS INTO EACH NOSTRIL EVERY DAY 16 g 1  . omeprazole (PRILOSEC) 20 MG capsule Take 1 capsule (20 mg total) by mouth daily as needed.    . venlafaxine XR (EFFEXOR-XR) 75 MG 24 hr capsule TAKE 1 CAPSULE DAILY WITH BREAKFAST 90 capsule 3   No current facility-administered medications on file prior to visit.    BP 124/84   Pulse 78   Resp 18   Ht 6' (1.829 m)   Wt 239 lb 9.6 oz (108.7 kg)   SpO2 98%   BMI 32.50 kg/m       Objective:   Physical Exam   General Mental Status- Alert. General Appearance- Not in acute distress.   Skin General: Color- Normal Color. Moisture- Normal Moisture. Scattered small moles.(derm visit 3 months ago no concerns per his work)  Neck Carotid Arteries- Normal color. Moisture- Normal Moisture. No carotid bruits. No JVD.  Chest and Lung Exam Auscultation: Breath Sounds:-Normal.  Cardiovascular Auscultation:Rythm- Regular. Murmurs & Other Heart Sounds:Auscultation of the heart reveals- No Murmurs.  Abdomen Inspection:-Inspeection  Normal. Palpation/Percussion:Note:No mass. Palpation and Percussion of the abdomen reveal- Non Tender, Non Distended + BS, no rebound or guarding.  Neurologic Cranial Nerve exam:- CN III-XII intact(No nystagmus), symmetric smile. Strength:- 5/5 equal and symmetric strength both upper and lower extremities.   Genital exam- normal. No hernias. No testicle tenderness. No masses.    Assessment & Plan:  For you wellness exam today I have ordered cbc, cmp and lipid panel  Will get tdap.later. Deferred today.  Recommend exercise and healthy diet.  We will let you know lab results as they come in.  Follow up date appointment  will be determined after lab review.   No abd/pelvis pain today. Will make cipro and flagyl available due to hx of diveticulitis. Use if pain comes back. If using and pain not improving then may need ct/abd pelvis and be referred back to gi.

## 2019-12-19 ENCOUNTER — Other Ambulatory Visit: Payer: Self-pay

## 2019-12-19 ENCOUNTER — Other Ambulatory Visit (INDEPENDENT_AMBULATORY_CARE_PROVIDER_SITE_OTHER): Payer: BC Managed Care – PPO

## 2019-12-19 DIAGNOSIS — Z0000 Encounter for general adult medical examination without abnormal findings: Secondary | ICD-10-CM | POA: Diagnosis not present

## 2019-12-19 DIAGNOSIS — Z125 Encounter for screening for malignant neoplasm of prostate: Secondary | ICD-10-CM | POA: Diagnosis not present

## 2019-12-19 LAB — COMPREHENSIVE METABOLIC PANEL
ALT: 22 U/L (ref 0–53)
AST: 18 U/L (ref 0–37)
Albumin: 4.8 g/dL (ref 3.5–5.2)
Alkaline Phosphatase: 47 U/L (ref 39–117)
BUN: 18 mg/dL (ref 6–23)
CO2: 28 mEq/L (ref 19–32)
Calcium: 9.8 mg/dL (ref 8.4–10.5)
Chloride: 102 mEq/L (ref 96–112)
Creatinine, Ser: 1.11 mg/dL (ref 0.40–1.50)
GFR: 70.55 mL/min (ref >60.00)
Glucose, Bld: 98 mg/dL (ref 70–99)
Potassium: 4.1 mEq/L (ref 3.5–5.1)
Sodium: 137 mEq/L (ref 135–145)
Total Bilirubin: 0.5 mg/dL (ref 0.2–1.2)
Total Protein: 7.3 g/dL (ref 6.0–8.3)

## 2019-12-19 LAB — CBC WITH DIFFERENTIAL/PLATELET
Basophils Absolute: 0.1 10*3/uL (ref 0.0–0.1)
Basophils Relative: 0.8 % (ref 0.0–3.0)
Eosinophils Absolute: 0.2 10*3/uL (ref 0.0–0.7)
Eosinophils Relative: 2 % (ref 0.0–5.0)
HCT: 43.7 % (ref 39.0–52.0)
Hemoglobin: 14.7 g/dL (ref 13.0–17.0)
Lymphocytes Relative: 24.3 % (ref 12.0–46.0)
Lymphs Abs: 2 10*3/uL (ref 0.7–4.0)
MCHC: 33.5 g/dL (ref 30.0–36.0)
MCV: 84 fl (ref 78.0–100.0)
Monocytes Absolute: 0.6 10*3/uL (ref 0.1–1.0)
Monocytes Relative: 6.7 % (ref 3.0–12.0)
Neutro Abs: 5.5 10*3/uL (ref 1.4–7.7)
Neutrophils Relative %: 66.2 % (ref 43.0–77.0)
Platelets: 329 10*3/uL (ref 150.0–400.0)
RBC: 5.2 Mil/uL (ref 4.22–5.81)
RDW: 13.6 % (ref 11.5–15.5)
WBC: 8.3 10*3/uL (ref 4.0–10.5)

## 2019-12-19 LAB — LIPID PANEL
Cholesterol: 186 mg/dL (ref 0–200)
HDL: 37.6 mg/dL — ABNORMAL LOW (ref >39.00)
NonHDL: 148.72
Total CHOL/HDL Ratio: 5
Triglycerides: 211 mg/dL — ABNORMAL HIGH (ref 0.0–149.0)
VLDL: 42.2 mg/dL — ABNORMAL HIGH (ref 0.0–40.0)

## 2019-12-19 LAB — LDL CHOLESTEROL, DIRECT: Direct LDL: 120 mg/dL

## 2019-12-19 LAB — PSA: PSA: 1.11 ng/mL (ref 0.10–4.00)

## 2019-12-20 ENCOUNTER — Encounter: Payer: Self-pay | Admitting: Family

## 2020-02-03 ENCOUNTER — Other Ambulatory Visit: Payer: Self-pay | Admitting: Medical

## 2020-03-01 ENCOUNTER — Encounter: Payer: Self-pay | Admitting: Medical

## 2020-03-04 ENCOUNTER — Telehealth: Payer: Self-pay | Admitting: Medical

## 2020-03-04 MED ORDER — ATORVASTATIN CALCIUM 10 MG PO TABS: 10.0000 mg | ORAL_TABLET | Freq: Every day | ORAL | 4 refills | Status: DC

## 2020-03-04 NOTE — Telephone Encounter (Signed)
Refill atorvastatin sent to pt pharmay.

## 2020-05-03 ENCOUNTER — Other Ambulatory Visit: Payer: Self-pay | Admitting: Medical

## 2020-07-25 ENCOUNTER — Other Ambulatory Visit: Payer: Self-pay | Admitting: Medical

## 2020-07-25 MED ORDER — ALPRAZOLAM 0.5 MG PO TABS: 0.5000 mg | ORAL_TABLET | Freq: Every evening | ORAL | 0 refills | Status: DC | PRN

## 2020-07-25 NOTE — Telephone Encounter (Signed)
Refilled 15 tab xanax rx. He used about 15 tab every 6 months. So not on contract. If getting more frequent woud put on contract.  Checked contolled med site. Only my rx.

## 2020-07-25 NOTE — Telephone Encounter (Signed)
Requesting: alprazolam Contract: 09/28/17 UDS: 09/28/17 Last Visit: 12/14/19 Next Visit: none Last Refill: 06/26/19  Please Advise

## 2020-08-01 ENCOUNTER — Other Ambulatory Visit: Payer: Self-pay | Admitting: Medical

## 2020-08-26 ENCOUNTER — Other Ambulatory Visit: Payer: Self-pay | Admitting: Medical

## 2020-08-29 ENCOUNTER — Encounter: Payer: Self-pay | Admitting: Medical

## 2020-09-04 ENCOUNTER — Ambulatory Visit (INDEPENDENT_AMBULATORY_CARE_PROVIDER_SITE_OTHER): Payer: BC Managed Care – PPO | Admitting: Medical

## 2020-09-04 ENCOUNTER — Encounter: Payer: Self-pay | Admitting: Medical

## 2020-09-04 ENCOUNTER — Other Ambulatory Visit: Payer: Self-pay

## 2020-09-04 VITALS — BP 110/88 | HR 84 | Temp 97.8°F | Resp 18 | Ht 72.0 in | Wt 232.4 lb

## 2020-09-04 DIAGNOSIS — M2550 Pain in unspecified joint: Secondary | ICD-10-CM

## 2020-09-04 DIAGNOSIS — R5383 Other fatigue: Secondary | ICD-10-CM

## 2020-09-04 DIAGNOSIS — M2559 Pain in other specified joint: Secondary | ICD-10-CM

## 2020-09-04 DIAGNOSIS — M791 Myalgia, unspecified site: Secondary | ICD-10-CM

## 2020-09-04 DIAGNOSIS — R61 Generalized hyperhidrosis: Secondary | ICD-10-CM

## 2020-09-04 NOTE — Progress Notes (Signed)
Subjective:    Patient ID: Erik Cole, male    DOB: [birth year], 50 y.o.   MRN: [id number]  HPI  Pt in for some recent muscle soreness and stiff joints.  Joint pain area in areas of knees, wrist, shoulders and wrist have pain. Worse in the morning. He states muscles sore and tired. Stiffness in am. Subsides in about 2 hours.  Pt states symptoms started around 2 weeks ago. No preceding acute strenuous pain before symptoms started.   Pt has tried naproxen and seems to help at night.   Review of Systems  Constitutional: Positive for diaphoresis and fatigue. Negative for chills and fever.       Intermittent random sweating at times. At one point thought it was mask related.  Random sweat a minute or less. He states can't say exact frequency.  Respiratory: Negative for cough, chest tightness, shortness of breath and wheezing.   Cardiovascular: Negative for chest pain and palpitations.  Gastrointestinal: Negative for abdominal pain.  Genitourinary: Negative for dysuria, frequency, penile pain and testicular pain.  Musculoskeletal: Positive for arthralgias and myalgias.  Neurological: Negative for speech difficulty, weakness and headaches.   Past Medical History:  Diagnosis Date  . Anxiety    mild, xanax sparingly  . GERD (gastroesophageal reflux disease)   . History of chicken pox   . HLD (hyperlipidemia)    previous PCP stopped tricor 2/2 elevated LFTs 07/2010  . 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 Obesity   . Transaminitis    hx mild, thought fibrate related - w/u ~2008 neg HbsAg, Hep C, ANA, nl iron/ferritin, ceruloplasmin, a1-antitrypsin, normal [place]     Social History   Socioeconomic History  . Marital status: Married    Spouse name: Not on file  . Number of children: 2  . Years of education: college  . Highest education level: Not on file  Occupational History  . Occupation: Personal assistant: [employer]    Comment: [employer]  Tobacco Use  .  Smoking status: Never Smoker  . Smokeless tobacco: Never Used  Substance and Sexual Activity  . Alcohol use: Yes    Alcohol/week: 0.0 standard drinks    Comment: Rare  . Drug use: No  . Sexual activity: Not on file  Other Topics Concern  . Not on file  Social History Narrative   Caffeine: occasionally.   Lives with wife [name]), 2 children (son and daughter), 1 cat   Occupation:    Activity: no regular exercise   Diet: good water, daily fruits/vegetables, red meat rarely, fish 1x/wk, cut out fast food   Social Determinants of Health   Financial Resource Strain: Not on file  Food Insecurity: Not on file  Transportation Needs: Not on file  Physical Activity: Not on file  Stress: Not on file  Social Connections: Not on file  Intimate Partner Violence: Not on file    Past Surgical History:  Procedure Laterality Date  . COLONOSCOPY  10/2015   one polyp, diverticulosis, rpt 5 yrs ([facility])  . GANGLION CYST EXCISION Right 2008    wrist    Family History  Problem Relation Age of Onset  . Hypertension Mother   . Hyperlipidemia Father        TG  . Diverticulitis Father   . Diabetes Maternal Grandmother   . Coronary artery disease Paternal Grandfather 55       unhealthy  . Breast cancer Other 58       great grandmother,  maternal  . Stroke Neg Hx   . Colon cancer Neg Hx   . Colon polyps Neg Hx     No Active Allergies  Current Outpatient Medications on File Prior to Visit  Medication Sig Dispense Refill  . ALPRAZolam (XANAX) 0.5 MG tablet Take 1 tablet (0.5 mg total) by mouth at bedtime as needed for anxiety. 15 tablet 0  . atorvastatin (LIPITOR) 10 MG tablet Take 1 tablet (10 mg total) by mouth daily. 90 tablet 4  . cetirizine (ZYRTEC) 10 MG tablet Take 10 mg by mouth as needed for allergies. Reported on 10/31/2015    . fenofibrate micronized (ANTARA) 130 MG capsule TAKE 1 CAPSULE DAILY 90 capsule 3  . fluticasone (FLONASE) 50 MCG/ACT nasal spray SPRAY 2 SPRAYS INTO  EACH NOSTRIL EVERY DAY 16 g 1  . omeprazole (PRILOSEC) 20 MG capsule Take 1 capsule (20 mg total) by mouth daily as needed.    . venlafaxine XR (EFFEXOR-XR) 75 MG 24 hr capsule Take 1 capsule (75 mg total) by mouth daily with breakfast. 90 capsule 0  . ciprofloxacin (CIPRO) 500 MG tablet Take 1 tablet (500 mg total) by mouth 2 (two) times daily. (Patient not taking: Reported on 09/04/2020) 14 tablet 0  . metroNIDAZOLE (FLAGYL) 500 MG tablet Take 1 tablet (500 mg total) by mouth 3 (three) times daily. (Patient not taking: Reported on 09/04/2020) 21 tablet 0   No current facility-administered medications on file prior to visit.    BP 110/88 (BP Location: Left Arm, Patient Position: Sitting, Cuff Size: Normal)   Pulse 84   Temp 97.8 F (36.6 C) (Oral)   Resp 18   Ht 6' (1.829 m)   Wt 232 lb 6.4 oz (105.4 kg)   SpO2 96%   BMI 31.52 kg/m       Objective:   Physical Exam   General Mental Status- Alert. General Appearance- Not in acute distress.   Skin General: Color- Normal Color. Moisture- Normal Moisture.  Neck Carotid Arteries- Normal color. Moisture- Normal Moisture. No carotid bruits. No JVD.  Chest and Lung Exam Auscultation: Breath Sounds:-Normal.  Cardiovascular Auscultation:Rythm- Regular. Murmurs & Other Heart Sounds:Auscultation of the heart reveals- No Murmurs.  Abdomen Inspection:-Inspeection Normal. Palpation/Percussion:Note:No mass. Palpation and Percussion of the abdomen reveal- Non Tender, Non Distended + BS, no rebound or guarding.    Neurologic Cranial Nerve exam:- CN III-XII intact(No nystagmus), symmetric smile. Drift Test:- No drift. Romberg Exam:- Negative.  Heal to Toe Gait exam:-Normal. Finger to Nose:- Normal/Intact Strength:- 5/5 equal and symmetric strength both upper and lower extremities.  Msk-  Bilateral shoulder, elbows, wrist, knees  Good rom with  no crepitus. Both hips faint pain on rotation.(no joint deformtiy)     Assessment &  Plan:  For diffuse joint pains and myalgia did place inflammatory labs panel.  For fatigue placed cmp, cbc, tsh, t4, b12, b1, vit d and iron studies.  Decided not to get xrays presently.  Follow labs and see if any markers elevated. After lab review considering trial prednisone taper over 6 days. Also possible rheumatologist referral.  When you do have random sweating event due recommend that you check your temp. Let us know if you have temp above 99.6.  Follow up 10-14 days or as needed.  Mackie Pai, PA-C

## 2020-09-04 NOTE — Patient Instructions (Addendum)
For diffuse joint pains and myalgia did place inflammatory labs panel.  For fatigue placed cmp, cbc, tsh, t4, b12, b1, vit d and iron studies.  Decided not to get xrays presently.  Follow labs and see if any markers elevated. After lab review considering trial prednisone taper over 6 days. Also possible rheumatologist referral.  When you do have random sweating event due recommend that you check your temp. Let us know if you have temp above 99.6.  Follow up 10-14 days or as needed.

## 2020-09-05 ENCOUNTER — Telehealth: Payer: Self-pay | Admitting: Medical

## 2020-09-05 LAB — CBC WITH DIFFERENTIAL/PLATELET
Basophils Absolute: 0.1 10*3/uL (ref 0.0–0.1)
Basophils Relative: 1 % (ref 0.0–3.0)
Eosinophils Absolute: 0.2 10*3/uL (ref 0.0–0.7)
Eosinophils Relative: 3.6 % (ref 0.0–5.0)
HCT: 38.7 % — ABNORMAL LOW (ref 39.0–52.0)
Hemoglobin: 13.1 g/dL (ref 13.0–17.0)
Lymphocytes Relative: 30.9 % (ref 12.0–46.0)
Lymphs Abs: 1.8 10*3/uL (ref 0.7–4.0)
MCHC: 33.8 g/dL (ref 30.0–36.0)
MCV: 85.7 fl (ref 78.0–100.0)
Monocytes Absolute: 0.5 10*3/uL (ref 0.1–1.0)
Monocytes Relative: 9.3 % (ref 3.0–12.0)
Neutro Abs: 3.2 10*3/uL (ref 1.4–7.7)
Neutrophils Relative %: 55.2 % (ref 43.0–77.0)
Platelets: 311 10*3/uL (ref 150.0–400.0)
RBC: 4.51 Mil/uL (ref 4.22–5.81)
RDW: 13.1 % (ref 11.5–15.5)
WBC: 5.7 10*3/uL (ref 4.0–10.5)

## 2020-09-05 LAB — COMPREHENSIVE METABOLIC PANEL
ALT: 47 U/L (ref 0–53)
AST: 41 U/L — ABNORMAL HIGH (ref 0–37)
Albumin: 4.4 g/dL (ref 3.5–5.2)
Alkaline Phosphatase: 35 U/L — ABNORMAL LOW (ref 39–117)
BUN: 19 mg/dL (ref 6–23)
CO2: 27 mEq/L (ref 19–32)
Calcium: 10.2 mg/dL (ref 8.4–10.5)
Chloride: 102 mEq/L (ref 96–112)
Creatinine, Ser: 1.08 mg/dL (ref 0.40–1.50)
GFR: 80.75 mL/min (ref >60.00)
Glucose, Bld: 91 mg/dL (ref 70–99)
Potassium: 4.1 mEq/L (ref 3.5–5.1)
Sodium: 138 mEq/L (ref 135–145)
Total Bilirubin: 0.4 mg/dL (ref 0.2–1.2)
Total Protein: 6.8 g/dL (ref 6.0–8.3)

## 2020-09-05 LAB — CK: Total CK: 98 U/L (ref 7–232)

## 2020-09-05 LAB — SEDIMENTATION RATE: Sed Rate: 9 mm/hr (ref 0–15)

## 2020-09-05 LAB — VITAMIN B12: Vitamin B-12: >1506 pg/mL — ABNORMAL HIGH (ref 211–911)

## 2020-09-05 LAB — C-REACTIVE PROTEIN: CRP: <1 mg/dL (ref 0.5–20.0)

## 2020-09-05 LAB — T4, FREE: Free T4: 0.38 ng/dL — ABNORMAL LOW (ref 0.60–1.60)

## 2020-09-05 LAB — TSH: TSH: 0.89 u[IU]/mL (ref 0.35–4.50)

## 2020-09-05 LAB — IRON: Iron: 108 ug/dL (ref 42–165)

## 2020-09-05 LAB — URIC ACID: Uric Acid, Serum: 5.4 mg/dL (ref 4.0–7.8)

## 2020-09-05 MED ORDER — PREDNISONE 10 MG (21) PO TBPK: ORAL_TABLET | ORAL | 0 refills | Status: DC

## 2020-09-05 NOTE — Telephone Encounter (Signed)
Will you send prednisone taper dose to his pharmacy. For some reason it printed and did not go thru electroniically. Let pt know sent to local pharmacy not mail order.

## 2020-09-05 NOTE — Telephone Encounter (Signed)
Script confirmation at bottom states received at pharmacy at 653 am

## 2020-09-08 ENCOUNTER — Encounter: Payer: Self-pay | Admitting: Medical

## 2020-09-08 LAB — ANA: Anti Nuclear Antibody (ANA): NEGATIVE

## 2020-09-08 LAB — VITAMIN D 1,25 DIHYDROXY
Vitamin D 1, 25 (OH)2 Total: 24 pg/mL (ref 18–72)
Vitamin D2 1, 25 (OH)2: <8 pg/mL
Vitamin D3 1, 25 (OH)2: 24 pg/mL

## 2020-09-08 LAB — VITAMIN B1: Vitamin B1 (Thiamine): 19 nmol/L (ref 8–30)

## 2020-09-08 LAB — RHEUMATOID FACTOR: Rheumatoid fact SerPl-aCnc: <14 IU/mL (ref <14)

## 2020-09-08 LAB — HLA-B27 ANTIGEN: HLA-B27 Antigen: NEGATIVE

## 2020-09-18 ENCOUNTER — Ambulatory Visit (INDEPENDENT_AMBULATORY_CARE_PROVIDER_SITE_OTHER): Payer: BC Managed Care – PPO | Admitting: Medical

## 2020-09-18 ENCOUNTER — Other Ambulatory Visit: Payer: Self-pay

## 2020-09-18 ENCOUNTER — Encounter: Payer: Self-pay | Admitting: Medical

## 2020-09-18 VITALS — BP 115/70 | HR 79 | Temp 98.3°F | Resp 16 | Ht 71.0 in | Wt 229.0 lb

## 2020-09-18 DIAGNOSIS — R7989 Other specified abnormal findings of blood chemistry: Secondary | ICD-10-CM | POA: Diagnosis not present

## 2020-09-18 DIAGNOSIS — M2550 Pain in unspecified joint: Secondary | ICD-10-CM | POA: Diagnosis not present

## 2020-09-18 DIAGNOSIS — Z1152 Encounter for screening for COVID-19: Secondary | ICD-10-CM

## 2020-09-18 DIAGNOSIS — J309 Allergic rhinitis, unspecified: Secondary | ICD-10-CM

## 2020-09-18 LAB — TSH: TSH: 1.73 u[IU]/mL (ref 0.35–4.50)

## 2020-09-18 LAB — T4, FREE: Free T4: 0.44 ng/dL — ABNORMAL LOW (ref 0.60–1.60)

## 2020-09-18 NOTE — Progress Notes (Signed)
Subjective:    Patient ID: Erik Cole, male    DOB: [birth year], 50 y.o.   MRN: [id number]  HPI  Pt in for follow up.  Pt had some diffuse myalgias and arthralgias all over. Pt had negative inflammatory labs but had significant quick resolution of his symptoms with even first day use of prednisone 60 mg dose. He tooks full 6 day course. He states now back to his baseline normal.   t4 was low ant tsh was in normal range but low.   Review of Systems  Constitutional: Negative for chills, fatigue and fever.  HENT:       Mild hoarse voice recently.   Respiratory: Negative for chest tightness, shortness of breath and wheezing.   Cardiovascular: Negative for chest pain and palpitations.  Gastrointestinal: Negative for abdominal pain.  Genitourinary: Negative for difficulty urinating, flank pain and frequency.  Musculoskeletal: Negative for back pain.  Skin: Negative for rash.  Neurological: Negative for dizziness, speech difficulty, weakness, light-headedness and headaches.  Hematological: Negative for adenopathy. Does not bruise/bleed easily.  Psychiatric/Behavioral: Negative for behavioral problems, confusion, dysphoric mood and sleep disturbance. The patient is not nervous/anxious.    Past Medical History:  Diagnosis Date  . Anxiety    mild, xanax sparingly  . GERD (gastroesophageal reflux disease)   . History of chicken pox   . HLD (hyperlipidemia)    previous PCP stopped tricor 2/2 elevated LFTs 07/2010  . 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 Obesity   . Transaminitis    hx mild, thought fibrate related - w/u ~2008 neg HbsAg, Hep C, ANA, nl iron/ferritin, ceruloplasmin, a1-antitrypsin, normal [place]     Social History   Socioeconomic History  . Marital status: Married    Spouse name: Not on file  . Number of children: 2  . Years of education: college  . Highest education level: Not on file  Occupational History  . Occupation: Personal assistant:  [employer]    Comment: [employer]  Tobacco Use  . Smoking status: Never Smoker  . Smokeless tobacco: Never Used  Substance and Sexual Activity  . Alcohol use: Yes    Alcohol/week: 0.0 standard drinks    Comment: Rare  . Drug use: No  . Sexual activity: Not on file  Other Topics Concern  . Not on file  Social History Narrative   Caffeine: occasionally.   Lives with wife [name]), 2 children (son and daughter), 1 cat   Occupation:    Activity: no regular exercise   Diet: good water, daily fruits/vegetables, red meat rarely, fish 1x/wk, cut out fast food   Social Determinants of Health   Financial Resource Strain: Not on file  Food Insecurity: Not on file  Transportation Needs: Not on file  Physical Activity: Not on file  Stress: Not on file  Social Connections: Not on file  Intimate Partner Violence: Not on file    Past Surgical History:  Procedure Laterality Date  . COLONOSCOPY  10/2015   one polyp, diverticulosis, rpt 5 yrs ([facility])  . GANGLION CYST EXCISION Right 2008    wrist    Family History  Problem Relation Age of Onset  . Hypertension Mother   . Hyperlipidemia Father        TG  . Diverticulitis Father   . Diabetes Maternal Grandmother   . Coronary artery disease Paternal Grandfather 55       unhealthy  . Breast cancer Other 80  great grandmother, maternal  . Stroke Neg Hx   . Colon cancer Neg Hx   . Colon polyps Neg Hx     No Active Allergies  Current Outpatient Medications on File Prior to Visit  Medication Sig Dispense Refill  . ALPRAZolam (XANAX) 0.5 MG tablet Take 1 tablet (0.5 mg total) by mouth at bedtime as needed for anxiety. 15 tablet 0  . atorvastatin (LIPITOR) 10 MG tablet Take 1 tablet (10 mg total) by mouth daily. 90 tablet 4  . cetirizine (ZYRTEC) 10 MG tablet Take 10 mg by mouth as needed for allergies. Reported on 10/31/2015    . ciprofloxacin (CIPRO) 500 MG tablet Take 1 tablet (500 mg total) by mouth 2 (two) times daily. (Patient  not taking: Reported on 09/04/2020) 14 tablet 0  . fenofibrate micronized (ANTARA) 130 MG capsule TAKE 1 CAPSULE DAILY 90 capsule 3  . fluticasone (FLONASE) 50 MCG/ACT nasal spray SPRAY 2 SPRAYS INTO EACH NOSTRIL EVERY DAY 16 g 1  . metroNIDAZOLE (FLAGYL) 500 MG tablet Take 1 tablet (500 mg total) by mouth 3 (three) times daily. (Patient not taking: Reported on 09/04/2020) 21 tablet 0  . omeprazole (PRILOSEC) 20 MG capsule Take 1 capsule (20 mg total) by mouth daily as needed.    . predniSONE (STERAPRED UNI-PAK 21 TAB) 10 MG (21) TBPK tablet Stanard taper over 6 days. 21 tablet 0  . venlafaxine XR (EFFEXOR-XR) 75 MG 24 hr capsule Take 1 capsule (75 mg total) by mouth daily with breakfast. 90 capsule 0   No current facility-administered medications on file prior to visit.    BP 104/65 (BP Location: Right Arm, Patient Position: Sitting, Cuff Size: Small)   Pulse 79   Temp 98.3 F (36.8 C) (Oral)   Resp 16   Ht 5\' 11"  (1.803 m)   Wt 229 lb (103.9 kg)   SpO2 99%   BMI 31.94 kg/m        Objective:   Physical Exam  General Mental Status- Alert. General Appearance- Not in acute distress.   Skin General: Color- Normal Color. Moisture- Normal Moisture.  Neck Carotid Arteries- Normal color. Moisture- Normal Moisture. No carotid bruits. No JVD.  Chest and Lung Exam Auscultation: Breath Sounds:-Normal.  Cardiovascular Auscultation:Rythm- Regular. Murmurs & Other Heart Sounds:Auscultation of the heart reveals- No Murmurs.  Abdomen Inspection:-Inspeection Normal. Palpation/Percussion:Note:No mass. Palpation and Percussion of the abdomen reveal- Non Tender, Non Distended + BS, no rebound or guarding.    Neurologic Cranial Nerve exam:- CN III-XII intact(No nystagmus), symmetric smile. Strength:- 5/5 equal and symmetric strength both upper and lower extremities.      Assessment & Plan:  Resolved muscle and joint pain with prednisone. Do want to refer you to rheumatologist for  further evaluation and work up.  For low t4 level repeat tsh and t4 today.  Decided to go ahead and do covid igg screening.  For history of allergies can use zyrtec and flonase. Pollen already falling.  Follow up 3-6 months or as needed

## 2020-09-18 NOTE — Patient Instructions (Addendum)
Resolved muscle and joint pain with prednisone. Do want to refer you to rheumatologist for further evaluation and work up.  For low t4 level repeat tsh and t4 today.  Decided to go ahead and do covid igg screening.  For history of allergies can use zyrtec and flonase. Pollen already falling.  Follow up 3-6 months or as needed.

## 2020-09-19 ENCOUNTER — Encounter: Payer: Self-pay | Admitting: Medical

## 2020-09-19 LAB — SARS-COV-2 ANTIBODY(IGG)SPIKE,SEMI-QUANTITATIVE: SARS COV1 AB(IGG)SPIKE,SEMI QN: <1 index (ref <1.00)

## 2020-09-22 ENCOUNTER — Other Ambulatory Visit: Payer: Self-pay | Admitting: Medical

## 2020-09-23 MED ORDER — VENLAFAXINE HCL ER 75 MG PO CP24: 75.0000 mg | ORAL_CAPSULE | Freq: Every day | ORAL | 1 refills | Status: DC

## 2020-11-07 ENCOUNTER — Encounter: Payer: Self-pay | Admitting: Gastroenterology

## 2020-12-13 ENCOUNTER — Encounter: Payer: Self-pay | Admitting: Medical

## 2020-12-14 ENCOUNTER — Telehealth: Payer: Self-pay | Admitting: Medical

## 2020-12-14 NOTE — Telephone Encounter (Signed)
Pt test + for covid on 12-11-2020. He has appointment for cpe on 12-20-2020. If he test negative on December 18, 2020 can he keep his appointment with me in person on 10th.

## 2020-12-16 NOTE — Telephone Encounter (Signed)
Pt notified via original mychart message

## 2020-12-18 ENCOUNTER — Ambulatory Visit: Payer: BC Managed Care – PPO | Admitting: Medical

## 2020-12-20 ENCOUNTER — Ambulatory Visit (INDEPENDENT_AMBULATORY_CARE_PROVIDER_SITE_OTHER): Payer: BC Managed Care – PPO | Admitting: Medical

## 2020-12-20 ENCOUNTER — Encounter: Payer: Self-pay | Admitting: Medical

## 2020-12-20 ENCOUNTER — Other Ambulatory Visit: Payer: Self-pay

## 2020-12-20 VITALS — BP 106/80 | HR 74 | Ht 71.0 in

## 2020-12-20 DIAGNOSIS — Z125 Encounter for screening for malignant neoplasm of prostate: Secondary | ICD-10-CM | POA: Diagnosis not present

## 2020-12-20 DIAGNOSIS — Z0000 Encounter for general adult medical examination without abnormal findings: Secondary | ICD-10-CM

## 2020-12-20 DIAGNOSIS — M791 Myalgia, unspecified site: Secondary | ICD-10-CM | POA: Diagnosis not present

## 2020-12-20 DIAGNOSIS — M65949 Unspecified synovitis and tenosynovitis, unspecified hand: Secondary | ICD-10-CM

## 2020-12-20 DIAGNOSIS — R5383 Other fatigue: Secondary | ICD-10-CM

## 2020-12-20 DIAGNOSIS — M2550 Pain in unspecified joint: Secondary | ICD-10-CM

## 2020-12-20 DIAGNOSIS — M659 Synovitis and tenosynovitis, unspecified: Secondary | ICD-10-CM | POA: Diagnosis not present

## 2020-12-20 LAB — COMPREHENSIVE METABOLIC PANEL
ALT: 32 U/L (ref 0–53)
AST: 29 U/L (ref 0–37)
Albumin: 4.6 g/dL (ref 3.5–5.2)
Alkaline Phosphatase: 47 U/L (ref 39–117)
BUN: 19 mg/dL (ref 6–23)
CO2: 26 mEq/L (ref 19–32)
Calcium: 9.7 mg/dL (ref 8.4–10.5)
Chloride: 103 mEq/L (ref 96–112)
Creatinine, Ser: 1.09 mg/dL (ref 0.40–1.50)
GFR: 79.69 mL/min (ref >60.00)
Glucose, Bld: 94 mg/dL (ref 70–99)
Potassium: 3.9 mEq/L (ref 3.5–5.1)
Sodium: 139 mEq/L (ref 135–145)
Total Bilirubin: 0.5 mg/dL (ref 0.2–1.2)
Total Protein: 7 g/dL (ref 6.0–8.3)

## 2020-12-20 LAB — CBC WITH DIFFERENTIAL/PLATELET
Basophils Absolute: 0 10*3/uL (ref 0.0–0.1)
Basophils Relative: 0.5 % (ref 0.0–3.0)
Eosinophils Absolute: 0.2 10*3/uL (ref 0.0–0.7)
Eosinophils Relative: 5.1 % — ABNORMAL HIGH (ref 0.0–5.0)
HCT: 38.7 % — ABNORMAL LOW (ref 39.0–52.0)
Hemoglobin: 12.9 g/dL — ABNORMAL LOW (ref 13.0–17.0)
Lymphocytes Relative: 48.6 % — ABNORMAL HIGH (ref 12.0–46.0)
Lymphs Abs: 1.7 10*3/uL (ref 0.7–4.0)
MCHC: 33.2 g/dL (ref 30.0–36.0)
MCV: 83 fl (ref 78.0–100.0)
Monocytes Absolute: 0.3 10*3/uL (ref 0.1–1.0)
Monocytes Relative: 7.8 % (ref 3.0–12.0)
Neutro Abs: 1.4 10*3/uL (ref 1.4–7.7)
Neutrophils Relative %: 38 % — ABNORMAL LOW (ref 43.0–77.0)
Platelets: 260 10*3/uL (ref 150.0–400.0)
RBC: 4.67 Mil/uL (ref 4.22–5.81)
RDW: 13.6 % (ref 11.5–15.5)
WBC: 3.6 10*3/uL — ABNORMAL LOW (ref 4.0–10.5)

## 2020-12-20 LAB — LDL CHOLESTEROL, DIRECT: Direct LDL: 86 mg/dL

## 2020-12-20 LAB — C-REACTIVE PROTEIN: CRP: <1 mg/dL (ref 0.5–20.0)

## 2020-12-20 LAB — IRON: Iron: 76 ug/dL (ref 42–165)

## 2020-12-20 LAB — LIPID PANEL
Cholesterol: 150 mg/dL (ref 0–200)
HDL: 29.8 mg/dL — ABNORMAL LOW (ref >39.00)
NonHDL: 119.71
Total CHOL/HDL Ratio: 5
Triglycerides: 238 mg/dL — ABNORMAL HIGH (ref 0.0–149.0)
VLDL: 47.6 mg/dL — ABNORMAL HIGH (ref 0.0–40.0)

## 2020-12-20 LAB — SEDIMENTATION RATE: Sed Rate: 11 mm/hr (ref 0–15)

## 2020-12-20 LAB — PSA: PSA: 0.02 ng/mL — ABNORMAL LOW (ref 0.10–4.00)

## 2020-12-20 LAB — TSH: TSH: 3.4 u[IU]/mL (ref 0.35–4.50)

## 2020-12-20 LAB — GLUCOSE, POCT (MANUAL RESULT ENTRY): POC Glucose: 110 mg/dl — AB (ref 70–99)

## 2020-12-20 LAB — VITAMIN B12: Vitamin B-12: >1550 pg/mL — ABNORMAL HIGH (ref 211–911)

## 2020-12-20 NOTE — Progress Notes (Signed)
Subjective:    Patient ID: Erik Cole, male    DOB: Sep 19, 1970, 50 y.o.   MRN: [id number]  HPI Pt is here for wellness exam.   Pt is fasting,   Pt has not been exercising. Pt states good diet. Cutting out carbs/sugars 1-2 cups coffee a day. No alcohol. Not smoking.   Pt has not got covid vaccine yet but can at work. Pt had covid 10 days.  He only residual nasal congestion.  Hx of myalgias and ache pain in the past. He has seen rheumatologist. Pt my have some cts of right wrist. They had recommended pt come off statin. The pain did not go away and he is still stiff. Ironically after covid joint pain and muscle pain resolved though early on in covid illness he had myalgias.   Before he had covid felt diffuse body aches, fatigue and extremity weakness.    Pt states he is due for colonoscopy. Was supposed to repeat at 5 years.    Review of Systems  Constitutional:  Negative for chills, fatigue and fever.  HENT:  Negative for dental problem.   Respiratory:  Negative for choking, chest tightness, shortness of breath and wheezing.   Cardiovascular:  Negative for chest pain and palpitations.  Gastrointestinal:  Negative for abdominal pain, constipation and nausea.  Genitourinary:  Negative for dysuria and frequency.  Musculoskeletal:  Positive for arthralgias and myalgias. Negative for gait problem.  Skin:  Negative for rash.  Neurological:  Negative for dizziness, weakness, light-headedness and headaches.       Restless feeling to leg with random leg muscle contraction extended position.  Hematological:  Negative for adenopathy. Does not bruise/bleed easily.  Psychiatric/Behavioral:  Negative for agitation, dysphoric mood, sleep disturbance and suicidal ideas. The patient is not nervous/anxious.      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   Obesity    Transaminitis    hx mild, thought fibrate related - w/u ~2008 neg HbsAg, Hep C, ANA, nl iron/ferritin, ceruloplasmin, a1-antitrypsin, normal [place]     Social History   Socioeconomic History   Marital status: Married    Spouse name: Not on file   Number of children: 2   Years of education: college   Highest education level: Not on file  Occupational History   Occupation: Personal assistant: [employer]    Comment: [employer]  Tobacco Use   Smoking status: Never   Smokeless tobacco: Never  Substance and Sexual Activity   Alcohol use: Yes    Alcohol/week: 0.0 standard drinks    Comment: Rare   Drug use: No   Sexual activity: Not on file  Other Topics Concern   Not on file  Social History Narrative   Caffeine: occasionally.   Lives with wife Erik Cole), 2 children (son and daughter), 1 cat   Occupation:    Activity: no regular exercise   Diet: good water, daily fruits/vegetables, red meat rarely, fish 1x/wk, cut out fast food   Social Determinants of Health   Financial Resource Strain: Not on file  Food Insecurity: Not on file  Transportation Needs: Not on file  Physical Activity: Not on file  Stress: Not on file  Social Connections: Not on file  Intimate Partner Violence: Not on file    Past Surgical History:  Procedure Laterality Date   COLONOSCOPY  10/2015   one polyp, diverticulosis, rpt 5 yrs ([facility])   GANGLION CYST EXCISION Right 2008    wrist    Family History  Problem Relation Age of Onset   Hypertension Mother    Hyperlipidemia Father        TG   Diverticulitis Father    Diabetes Maternal Grandmother    Coronary artery disease Paternal Grandfather 50       unhealthy   Breast cancer Other 32       great grandmother, maternal   Stroke Neg Hx    Colon cancer Neg Hx    Colon polyps Neg Hx     No Active Allergies  Current Outpatient  Medications on File Prior to Visit  Medication Sig Dispense Refill   ALPRAZolam (XANAX) 0.5 MG tablet Take 1 tablet (0.5 mg total) by mouth at bedtime as needed for anxiety. 15 tablet 0   atorvastatin (LIPITOR) 10 MG tablet Take 1 tablet (10 mg total) by mouth daily. 90 tablet 4   cetirizine (ZYRTEC) 10 MG tablet Take 10 mg by mouth as needed for allergies. Reported on 10/31/2015     fenofibrate micronized (ANTARA) 130 MG capsule TAKE 1 CAPSULE DAILY 90 capsule 3   fluticasone (FLONASE) 50 MCG/ACT nasal spray SPRAY 2 SPRAYS INTO EACH NOSTRIL EVERY DAY 16 g 1   omeprazole (PRILOSEC) 20 MG capsule Take 1 capsule (20 mg total) by mouth daily as needed.     venlafaxine XR (EFFEXOR-XR) 75 MG 24 hr capsule Take 1 capsule (75 mg total) by mouth daily with breakfast. 90 capsule 1   Ascorbic Acid (VITAMIN C) 500 MG CAPS See admin instructions.     atorvastatin (LIPITOR) 10 MG tablet 1 tablet     Cholecalciferol (VITAMIN D) 50 MCG (2000 UT) CAPS 1 capsule     naproxen sodium (ALEVE) 220 MG tablet 1 tablet with food or milk as needed     Zinc 100 MG TABS 1 tablet     No current facility-administered medications on file prior to visit.    BP 106/80   Pulse 74   Ht 5\' 11"  (1.803 m)   SpO2 98%   BMI 31.94 kg/m       Objective:   Physical Exam  General Mental Status- Alert. General Appearance- Not in acute distress.   Skin General: Color- Normal Color. Moisture- Normal Moisture.  Neck Carotid Arteries- Normal color. Moisture- Normal Moisture. No carotid bruits. No JVD.  Chest and Lung Exam Auscultation: Breath Sounds:-Normal.  Cardiovascular Auscultation:Rythm- Regular. Murmurs & Other Heart Sounds:Auscultation of the heart reveals- No Murmurs.  Abdomen Inspection:-Inspeection Normal. Palpation/Percussion:Note:No mass. Palpation and Percussion of the abdomen reveal- Non Tender, Non Distended + BS, no rebound or guarding.    Neurologic Cranial Nerve exam:- CN III-XII intact(No  nystagmus), symmetric smile. Drift Test:- No drift. Romberg Exam:- Negative.  Heal to Toe Gait exam:-Normal. Finger to Nose:- Normal/Intact Strength:- 5/5 equal and symmetric strength both upper and lower extremities.       Assessment & Plan:  For you wellness exam today I have ordered cbc, cmp, psa and  lipid panel.  For fatigue got tsh, t4, b12, b1, vit d and iron level.  Tdap declined today but can get scheduled later at nurse visit when ready.  For myalgia, arthralgias and ext weakness at times repeat inflammatory labs. Might refer back to rheumatologist. Considering  referral to neurologist.  Recommend exercise and healthy diet.  We will let you know lab results as they come in.  Follow up date appointment will be determined after lab review.    Recommend following thru with repeat colonoscopy.  Mackie Pai, PA-C    [phone] charge as well. Addressed post covid nasal congestion, hx of fatigue,  tenosynovitis, trigger finger, myalgia and arthralgias.

## 2020-12-20 NOTE — Addendum Note (Signed)
Addended by: Jeronimo Greaves on: 12/20/2020 09:19 AM   Modules accepted: Orders

## 2020-12-20 NOTE — Patient Instructions (Addendum)
For you wellness exam today I have ordered cbc, cmp, psa and  lipid panel.  For fatigue got tsh, t4, b12, b1, vit d and iron level.  Tdap declined today but can get scheduled later at nurse visit when ready.  For myalgia, arthralgias and ext weakness at times repeat inflammatory labs. Might refer back to rheumatologist. Considering referral to neurologist.  Recommend exercise and healthy diet.  We will let you know lab results as they come in.  Follow up date appointment will be determined after lab review.    Recommend following thru with repeat colonoscopy.  Rt base of thumb pain likely tenosynovitis vs CTS(refer to sports med and can go ahead and try to use thumb spica splint). Left hand trigger finger mid finger possible   Lower end blood pressure. Will follow labs. Stress hydrating well. Clammy briefly end of exam. Gave apples juice. Felt better. Sugar was 110. Sensation passed.  Preventive Care 50-39 Years Old, Male Preventive care refers to lifestyle choices and visits with your health care provider that can promote health and wellness. This includes: A yearly physical exam. This is also called an annual wellness visit. Regular dental and eye exams. Immunizations. Screening for certain conditions. Healthy lifestyle choices, such as: Eating a healthy diet. Getting regular exercise. Not using drugs or products that contain nicotine and tobacco. Limiting alcohol use. What can I expect for my preventive care visit? Physical exam Your health care provider will check your: Height and weight. These may be used to calculate your BMI (body mass index). BMI is a measurement that tells if you are at a healthy weight. Heart rate and blood pressure. Body temperature. Skin for abnormal spots. Counseling Your health care provider may ask you questions about your: Past medical problems. Family's medical history. Alcohol, tobacco, and drug use. Emotional well-being. Home life and  relationship well-being. Sexual activity. Diet, exercise, and sleep habits. Work and work Statistician. Access to firearms. What immunizations do I need?  Vaccines are usually given at various ages, according to a schedule. Your health care provider will recommend vaccines for you based on your age, medicalhistory, and lifestyle or other factors, such as travel or where you work. What tests do I need? Blood tests Lipid and cholesterol levels. These may be checked every 5 years, or more often if you are over 82 years old. Hepatitis C test. Hepatitis B test. Screening Lung cancer screening. You may have this screening every year starting at age 51 if you have a 30-pack-year history of smoking and currently smoke or have quit within the past 15 years. Prostate cancer screening. Recommendations will vary depending on your family history and other risks. Genital exam to check for testicular cancer or hernias. Colorectal cancer screening. All adults should have this screening starting at age 70 and continuing until age 15. Your health care provider may recommend screening at age 38 if you are at increased risk. You will have tests every 1-10 years, depending on your results and the type of screening test. Diabetes screening. This is done by checking your blood sugar (glucose) after you have not eaten for a while (fasting). You may have this done every 1-3 years. STD (sexually transmitted disease) testing, if you are at risk. Follow these instructions at home: Eating and drinking  Eat a diet that includes fresh fruits and vegetables, whole grains, lean protein, and low-fat dairy products. Take vitamin and mineral supplements as recommended by your health care provider. Do not drink alcohol if  your health care provider tells you not to drink. If you drink alcohol: Limit how much you have to 0-2 drinks a day. Be aware of how much alcohol is in your drink. In the [place] one drink equals one 12  oz bottle of beer (355 mL), one 5 oz glass of wine (148 mL), or one 1 oz glass of hard liquor (44 mL).  Lifestyle Take daily care of your teeth and gums. Brush your teeth every morning and night with fluoride toothpaste. Floss one time each day. Stay active. Exercise for at least 30 minutes 5 or more days each week. Do not use any products that contain nicotine or tobacco, such as cigarettes, e-cigarettes, and chewing tobacco. If you need help quitting, ask your health care provider. Do not use drugs. If you are sexually active, practice safe sex. Use a condom or other form of protection to prevent STIs (sexually transmitted infections). If told by your health care provider, take low-dose aspirin daily starting at age 27. Find healthy ways to cope with stress, such as: Meditation, yoga, or listening to music. Journaling. Talking to a trusted person. Spending time with friends and family. Safety Always wear your seat belt while driving or riding in a vehicle. Do not drive: If you have been drinking alcohol. Do not ride with someone who has been drinking. When you are tired or distracted. While texting. Wear a helmet and other protective equipment during sports activities. If you have firearms in your house, make sure you follow all gun safety procedures. What's next? Go to your health care provider once a year for an annual wellness visit. Ask your health care provider how often you should have your eyes and teeth checked. Stay up to date on all vaccines. This information is not intended to replace advice given to you by your health care provider. Make sure you discuss any questions you have with your healthcare provider. Document Revised: 03/28/2019 Document Reviewed: 06/23/2018 Elsevier Patient Education  2022 [employer].

## 2020-12-24 LAB — ANA: Anti Nuclear Antibody (ANA): NEGATIVE

## 2020-12-24 LAB — VITAMIN D 1,25 DIHYDROXY
Vitamin D 1, 25 (OH)2 Total: 42 pg/mL (ref 18–72)
Vitamin D2 1, 25 (OH)2: <8 pg/mL
Vitamin D3 1, 25 (OH)2: 42 pg/mL

## 2020-12-24 LAB — RHEUMATOID FACTOR: Rheumatoid fact SerPl-aCnc: <14 IU/mL (ref <14)

## 2020-12-28 LAB — VITAMIN B1: Vitamin B1 (Thiamine): 31 nmol/L — ABNORMAL HIGH (ref 8–30)

## 2021-01-08 ENCOUNTER — Encounter: Payer: Self-pay | Admitting: Family Medicine

## 2021-01-08 ENCOUNTER — Other Ambulatory Visit: Payer: Self-pay

## 2021-01-08 ENCOUNTER — Ambulatory Visit (INDEPENDENT_AMBULATORY_CARE_PROVIDER_SITE_OTHER): Payer: BC Managed Care – PPO | Admitting: Family Medicine

## 2021-01-08 ENCOUNTER — Ambulatory Visit: Payer: Self-pay

## 2021-01-08 VITALS — BP 120/82 | Ht 71.0 in | Wt 203.0 lb

## 2021-01-08 DIAGNOSIS — D72820 Lymphocytosis (symptomatic): Secondary | ICD-10-CM

## 2021-01-08 DIAGNOSIS — M79641 Pain in right hand: Secondary | ICD-10-CM

## 2021-01-08 DIAGNOSIS — M654 Radial styloid tenosynovitis [de Quervain]: Secondary | ICD-10-CM

## 2021-01-08 HISTORY — DX: Lymphocytosis (symptomatic): D72.820

## 2021-01-08 HISTORY — DX: Radial styloid tenosynovitis (de quervain): M65.4

## 2021-01-08 NOTE — Assessment & Plan Note (Signed)
Having body aches and arthralgias over the past few months.  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  Rheumatologic work-up has been negative thus far -CBC with differential and pathology smear.

## 2021-01-08 NOTE — Progress Notes (Signed)
Erik Cole - 50 y.o. male MRN [id number]  Date of birth: [birth year]  SUBJECTIVE:  Including CC & ROS.  No chief complaint on file.   Erik Cole is a 50 y.o. male that is presenting with right wrist pain and body aches and arthralgias.  His symptoms have been ongoing for the past few weeks.  His wrist pain is been ongoing for the past few days.  He does take prednisone and offers improvement of his arthralgias and body aches.  Lab work has been unrevealing thus far.  Wrist pain is occurring over the radial aspect.   Review of Systems See HPI   HISTORY: Past Medical, Surgical, Social, and Family History Reviewed & Updated per EMR.   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   Obesity    Transaminitis    hx mild, thought fibrate related - w/u ~2008 neg HbsAg, Hep C, ANA, nl iron/ferritin, ceruloplasmin, a1-antitrypsin, normal [place]    Past Surgical History:  Procedure Laterality Date   COLONOSCOPY  10/2015   one polyp, diverticulosis, rpt 5 yrs ([facility])   GANGLION CYST EXCISION Right 2008    wrist    Family History  Problem Relation Age of Onset   Hypertension Mother    Hyperlipidemia Father        TG   Diverticulitis Father    Diabetes Maternal Grandmother    Coronary artery disease Paternal Grandfather 9       unhealthy   Breast cancer Other 32       great grandmother, maternal   Stroke Neg Hx    Colon cancer Neg Hx    Colon polyps Neg Hx     Social History   Socioeconomic History   Marital status: Married    Spouse name: Not on file   Number of children: 2   Years of education: college   Highest education level: Not on file  Occupational History   Occupation: Environmental manager: [employer]    Comment: [employer]  Tobacco Use   Smoking status: Never   Smokeless tobacco: Never  Substance and Sexual Activity   Alcohol use: Yes    Alcohol/week: 0.0 standard drinks    Comment: Rare   Drug use: No   Sexual activity: Not on file  Other Topics Concern   Not on file  Social History Narrative   Caffeine: occasionally.   Lives with wife [name]), 2 children (son and daughter), 1 cat   Occupation:    Activity: no regular exercise   Diet: good water, daily fruits/vegetables, red meat rarely, fish 1x/wk, cut out fast food   Social Determinants of Health   Financial Resource Strain: Not on file  Food Insecurity: Not on file  Transportation Needs: Not on file  Physical Activity: Not on file  Stress: Not on file  Social Connections: Not on file  Intimate Partner Violence: Not on file     PHYSICAL EXAM:  VS: BP 120/82 (BP Location: Left Arm, Patient Position: Sitting, Cuff Size: Large)   Ht 5\' 11"  (1.803 m)   Wt 203 lb (92.1 kg)   BMI 28.31 kg/m  Physical Exam Gen: NAD, alert, cooperative with exam, well-appearing MSK:  Right  wrist: Normal range of motion. Normal strength resistance. No swelling or ecchymosis. Positive Finkelstein's test. Neurovascularly intact  Limited ultrasound: Right wrist:  Thickening as the first dorsal compartment crosses over the radial styloid. No changes of the CMC joint. Normal-appearing scaphoid. Mild effusion of the radioscaphoid joint   Summary: Findings consistent with de Quervain's tenosynovitis  Ultrasound and interpretation by Clearance Coots, MD    ASSESSMENT & PLAN:   Lymphocytosis Having body aches and arthralgias over the past few months.  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  Rheumatologic work-up has been negative thus far -CBC with differential and pathology smear.  De Quervain's disease (tenosynovitis) Symptoms at the wrist and most consistent with de  Quervain's. -Counseled on home exercise therapy and supportive care. -Provided Rayos samples. -Could consider injection or physical therapy

## 2021-01-08 NOTE — Assessment & Plan Note (Signed)
Symptoms at the wrist and most consistent with de Quervain's. -Counseled on home exercise therapy and supportive care. -Provided Rayos samples. -Could consider injection or physical therapy

## 2021-01-08 NOTE — Progress Notes (Signed)
Medication Samples have been provided to the patient.  Drug name: Rayos       Strength: 5 mg        Qty: 1  LOT: [id number]  Exp.Date: 01/2021  Dosing instructions: Take 1 at 10:00 pm.  The patient has been instructed regarding the correct time, dose, and frequency of taking this medication, including desired effects and most common side effects.   Jonathon Resides 9:39 AM 01/08/2021

## 2021-01-08 NOTE — Patient Instructions (Signed)
Nice to meet you  Please try the exercises  Please try the rayos at 10 pm at night.  I will call with the results.  Please send me a message in MyChart with any questions or updates.  Please see me back in 4 weeks.   --Dr. Raeford Razor

## 2021-01-09 LAB — CBC WITH DIFFERENTIAL
Basophils Absolute: 0.1 10*3/uL (ref 0.0–0.2)
Basos: 1 %
EOS (ABSOLUTE): 0.4 10*3/uL (ref 0.0–0.4)
Eos: 7 %
Hematocrit: 38.9 % (ref 37.5–51.0)
Hemoglobin: 12.5 g/dL — ABNORMAL LOW (ref 13.0–17.7)
Immature Grans (Abs): 0 10*3/uL (ref 0.0–0.1)
Immature Granulocytes: 0 %
Lymphocytes Absolute: 1.8 10*3/uL (ref 0.7–3.1)
Lymphs: 36 %
MCH: 27.4 pg (ref 26.6–33.0)
MCHC: 32.1 g/dL (ref 31.5–35.7)
MCV: 85 fL (ref 79–97)
Monocytes Absolute: 0.5 10*3/uL (ref 0.1–0.9)
Monocytes: 10 %
Neutrophils Absolute: 2.4 10*3/uL (ref 1.4–7.0)
Neutrophils: 46 %
RBC: 4.56 x10E6/uL (ref 4.14–5.80)
RDW: 13.4 % (ref 11.6–15.4)
WBC: 5.1 10*3/uL (ref 3.4–10.8)

## 2021-01-10 LAB — PATHOLOGIST SMEAR REVIEW
Basophils Absolute: 0.1 10*3/uL (ref 0.0–0.2)
Basos: 1 %
EOS (ABSOLUTE): 0.3 10*3/uL (ref 0.0–0.4)
Eos: 7 %
Hematocrit: 39.2 % (ref 37.5–51.0)
Hemoglobin: 12.7 g/dL — ABNORMAL LOW (ref 13.0–17.7)
Immature Grans (Abs): 0 10*3/uL (ref 0.0–0.1)
Immature Granulocytes: 0 %
Lymphocytes Absolute: 1.7 10*3/uL (ref 0.7–3.1)
Lymphs: 35 %
MCH: 27.7 pg (ref 26.6–33.0)
MCHC: 32.4 g/dL (ref 31.5–35.7)
MCV: 86 fL (ref 79–97)
Monocytes Absolute: 0.5 10*3/uL (ref 0.1–0.9)
Monocytes: 10 %
Neutrophils Absolute: 2.3 10*3/uL (ref 1.4–7.0)
Neutrophils: 47 %
Platelets: 302 10*3/uL (ref 150–450)
RBC: 4.58 x10E6/uL (ref 4.14–5.80)
RDW: 13.4 % (ref 11.6–15.4)
WBC: 4.9 10*3/uL (ref 3.4–10.8)

## 2021-01-11 ENCOUNTER — Other Ambulatory Visit: Payer: Self-pay | Admitting: Medical

## 2021-01-14 ENCOUNTER — Encounter: Payer: Self-pay | Admitting: Family Medicine

## 2021-01-14 ENCOUNTER — Telehealth: Payer: Self-pay | Admitting: Medical

## 2021-01-14 MED ORDER — ALPRAZOLAM 0.5 MG PO TABS: 0.5000 mg | ORAL_TABLET | Freq: Every evening | ORAL | 0 refills | Status: DC | PRN

## 2021-01-14 NOTE — Telephone Encounter (Signed)
Pt was called and I have informed him of the message. He stated understanding.

## 2021-01-14 NOTE — Telephone Encounter (Addendum)
Patient is requesting a refill of the following medications: Requested Prescriptions   Pending Prescriptions Disp Refills   ALPRAZolam (XANAX) 0.5 MG tablet 15 tablet 0    Sig: Take 1 tablet (0.5 mg total) by mouth at bedtime as needed for anxiety.    Date of patient request: 01/11/21 Last office visit: 6/10/222 Date of last refill: 07/25/20 Last refill amount: 15 Follow up time period per chart: none scheduled   Rare use. 15 tabs in about 6 months. Refilling today.  Mackie Pai, PA-C

## 2021-01-14 NOTE — Telephone Encounter (Signed)
Rx xanax sent to mail order. Will you let pt know.

## 2021-01-15 ENCOUNTER — Telehealth: Payer: Self-pay | Admitting: Family Medicine

## 2021-01-15 NOTE — Telephone Encounter (Signed)
Left VM for patient. If he calls back please have him speak with a nurse/CMA and inform that his results are showing anemia. We can look into this further if need. I'm not sure if the anemia if contributing to his symptoms.   If any questions then please take the best time and phone number to call and I will try to call him back.   Rosemarie Ax, MD [facility] 01/15/2021, 2:31 PM

## 2021-01-15 NOTE — Telephone Encounter (Signed)
Pt informed of below.  He will follow up with Dr. Raeford Razor at his 02/05/21 OV.

## 2021-01-31 ENCOUNTER — Telehealth: Payer: Self-pay

## 2021-01-31 NOTE — Telephone Encounter (Signed)
urse Assessment Nurse: Hassell Done, RN, Melanie Date/Time (Eastern Time): 01/31/2021 2:01:47 PM Confirm and document reason for call. If symptomatic, describe symptoms. ---Caller states when he takes a deep breath his chest throat hurts all the way up through his face and head. states also when he stands up it hurts like this too. Feels like it did once when he had GERD before. This pain started last night. Does the patient have any new or worsening symptoms? ---Yes Will a triage be completed? ---Yes Related visit to physician within the last 2 weeks? ---No Does the PT have any chronic conditions? (i.e. diabetes, asthma, this includes High risk factors for pregnancy, etc.) ---No Is this a behavioral health or substance abuse call? ---No Guidelines Guideline Title Affirmed Question Affirmed Notes Nurse Date/Time Eilene Ghazi Time) Chest Pain Taking a deep breath makes pain worse Dominica Severin, Threasa Beards 01/31/2021 2:03:49 PM Disp. Time Eilene Ghazi Time) Disposition Final User 01/31/2021 2:11:40 PM Go to ED Now (or PCP triage) Yes Hassell Done, RN, Melanie PLEASE NOTE: All timestamps contained within this report are represented as [place] Standard Time. CONFIDENTIALTY NOTICE: This fax transmission is intended only for the addressee. It contains information that is legally privileged, confidential or otherwise protected from use or disclosure. If you are not the intended recipient, you are strictly prohibited from reviewing, disclosing, copying using or disseminating any of this information or taking any action in reliance on or regarding this information. If you have received this fax in error, please notify us immediately by telephone so that we can arrange for its return to [place]. Phone: [phone], Toll-Free: [phone], Fax: [phone] Page: 2 of 2 Call Id: [id number] [place] Disagree/Comply Comply Caller Understands Yes PreDisposition Call Doctor Care Advice Given Per Guideline GO TO ED NOW (OR PCP  TRIAGE): * IF NO PCP (PRIMARY CARE PROVIDER) SECOND-LEVEL TRIAGE: You need to be seen within the next hour. Go to the [facility] at _____________ [place] as soon as you can. CALL EMS IF: * Severe difficulty breathing occurs * Passes out or becomes too weak to stand * You become worse CARE ADVICE given per Chest Pain (Adult) guideline. BRING MEDICINES: * Please bring a list of your current medicines when you go to see the doctor. Referrals GO TO FACILITY UNDECIDED

## 2021-01-31 NOTE — Telephone Encounter (Signed)
Called pt and lvm to return call and sent a mychart message as well

## 2021-02-05 ENCOUNTER — Ambulatory Visit (INDEPENDENT_AMBULATORY_CARE_PROVIDER_SITE_OTHER): Payer: BC Managed Care – PPO | Admitting: Family Medicine

## 2021-02-05 ENCOUNTER — Encounter: Payer: Self-pay | Admitting: Family Medicine

## 2021-02-05 ENCOUNTER — Other Ambulatory Visit: Payer: Self-pay

## 2021-02-05 VITALS — BP 110/74 | Ht 71.0 in | Wt 203.0 lb

## 2021-02-05 DIAGNOSIS — M25542 Pain in joints of left hand: Secondary | ICD-10-CM

## 2021-02-05 DIAGNOSIS — M25541 Pain in joints of right hand: Secondary | ICD-10-CM

## 2021-02-05 HISTORY — DX: Pain in joints of right hand: M25.541

## 2021-02-05 NOTE — Patient Instructions (Signed)
Good to see you Please let me know what the dermatologist says  Please use the rayos as close to 10 pm   Please send me a message in MyChart with any questions or updates.  Please see [place] back as needed.   --Dr. Raeford Razor

## 2021-02-05 NOTE — Assessment & Plan Note (Addendum)
Does have swelling of his fingers and left foot. Seems to be plantar fasciitis in left foot. Pain occurs in shoulder and back. Gets resolution with pain with prednisone. Lab work as has been unrevealing for source. Possible for psoriatic arthritis. -Counseled on home exercise therapy and supportive care. -He will be having a dermatology appointment as he has lost hair on the lateral aspect of each leg.  Unclear if there is association as well.  -Could consider HLA-B 27 or further imaging

## 2021-02-05 NOTE — Progress Notes (Signed)
Medication Samples have been provided to the patient.  Drug name: Rayos       Strength: 5 mg         Qty: 2 boxes LOT: [id number]  Exp.Date: 02/2022  Dosing instructions: take 1 at 10 pm  The patient has been instructed regarding the correct time, dose, and frequency of taking this medication, including desired effects and most common side effects.   Erik Cole 9:48 AM 02/05/2021

## 2021-02-05 NOTE — Progress Notes (Signed)
Erik Cole - 50 y.o. male MRN [id number]  Date of birth: [birth year]  SUBJECTIVE:  Including CC & ROS.  No chief complaint on file.   Erik Cole is a 50 y.o. male that is following up for his arthralgias.  His pain is get improvement with prednisone.  Today reports his fingers are swollen and his feet have swollen from time to time.  Has seen rheumatology previously with no specific diagnosis   Review of Systems See HPI   HISTORY: Past Medical, Surgical, Social, and Family History Reviewed & Updated per EMR.   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   Obesity    Transaminitis    hx mild, thought fibrate related - w/u ~2008 neg HbsAg, Hep C, ANA, nl iron/ferritin, ceruloplasmin, a1-antitrypsin, normal [place]    Past Surgical History:  Procedure Laterality Date   COLONOSCOPY  10/2015   one polyp, diverticulosis, rpt 5 yrs ([facility])   GANGLION CYST EXCISION Right 2008    wrist    Family History  Problem Relation Age of Onset   Hypertension Mother    Hyperlipidemia Father        TG   Diverticulitis Father    Diabetes Maternal Grandmother    Coronary artery disease Paternal Grandfather 16       unhealthy   Breast cancer Other 86       great grandmother, maternal   Stroke Neg Hx    Colon cancer Neg Hx    Colon polyps Neg Hx     Social History   Socioeconomic History   Marital status: Married    Spouse name: Not on file   Number of children: 2   Years of education: college   Highest education level: Not on file  Occupational History   Occupation: Personal assistant: [employer]    Comment: [employer]  Tobacco Use   Smoking status: Never   Smokeless tobacco: Never  Substance and Sexual  Activity   Alcohol use: Yes    Alcohol/week: 0.0 standard drinks    Comment: Rare   Drug use: No   Sexual activity: Not on file  Other Topics Concern   Not on file  Social History Narrative   Caffeine: occasionally.   Lives with wife Erik Cole), 2 children (son and daughter), 1 cat   Occupation:    Activity: no regular exercise   Diet: good water, daily fruits/vegetables, red meat rarely, fish 1x/wk, cut out fast food   Social Determinants of Health   Financial Resource Strain: Not on file  Food Insecurity: Not on file  Transportation Needs: Not on file  Physical Activity: Not on file  Stress: Not on file  Social Connections: Not on file  Intimate Partner Violence: Not on file     PHYSICAL EXAM:  VS: BP 110/74 (BP Location: Left Arm, Patient Position: Sitting, Cuff Size: Large)   Ht '5\' 11"'  (1.803 m)   Wt 203 lb (92.1 kg)   BMI 28.31 kg/m  Physical Exam Gen: NAD, alert, cooperative with exam, well-appearing       ASSESSMENT & PLAN:   Arthralgia of both hands Does have swelling of his fingers and left foot. Seems to be plantar fasciitis  in left foot. Pain occurs in shoulder and back. Gets resolution with pain with prednisone. Lab work as has been unrevealing for source. Possible for psoriatic arthritis. -Counseled on home exercise therapy and supportive care. -He will be having a dermatology appointment as he has lost hair on the lateral aspect of each leg.  Unclear if there is association as well.  -Could consider HLA-B 27 or further imaging

## 2021-02-18 ENCOUNTER — Encounter: Payer: Self-pay | Admitting: Medical

## 2021-03-03 ENCOUNTER — Telehealth: Payer: Self-pay | Admitting: Medical

## 2021-03-03 MED ORDER — VENLAFAXINE HCL ER 37.5 MG PO CP24: ORAL_CAPSULE | ORAL | 0 refills | Status: DC

## 2021-03-03 NOTE — Telephone Encounter (Signed)
Rx low dose effexor to taper off sent.

## 2021-03-04 ENCOUNTER — Other Ambulatory Visit: Payer: Self-pay | Admitting: Medical

## 2021-03-11 ENCOUNTER — Encounter: Payer: Self-pay | Admitting: Family Medicine

## 2021-03-12 ENCOUNTER — Other Ambulatory Visit: Payer: Self-pay

## 2021-03-12 ENCOUNTER — Encounter: Payer: Self-pay | Admitting: Family Medicine

## 2021-03-12 ENCOUNTER — Ambulatory Visit (INDEPENDENT_AMBULATORY_CARE_PROVIDER_SITE_OTHER): Payer: BC Managed Care – PPO | Admitting: Family Medicine

## 2021-03-12 ENCOUNTER — Ambulatory Visit: Payer: Self-pay

## 2021-03-12 VITALS — Ht 71.0 in | Wt 196.0 lb

## 2021-03-12 DIAGNOSIS — M222X1 Patellofemoral disorders, right knee: Secondary | ICD-10-CM

## 2021-03-12 DIAGNOSIS — M25561 Pain in right knee: Secondary | ICD-10-CM

## 2021-03-12 HISTORY — DX: Patellofemoral disorders, right knee: M22.2X1

## 2021-03-12 NOTE — Assessment & Plan Note (Signed)
Knee exam is reassuring with no significant changes.  Likely patellofemoral in nature. -Counseled on home exercise therapy and supportive care. -Counseled ibuprofen. -Could consider prednisone or injection.

## 2021-03-12 NOTE — Progress Notes (Signed)
Erik Cole - 50 y.o. male MRN [id number]  Date of birth: 04-19-1971  SUBJECTIVE:  Including CC & ROS.  No chief complaint on file.   Erik Cole is a 50 y.o. male that is presenting with acute right knee pain.  The pain is anterior nature.  No swelling or ecchymosis.    Review of Systems See HPI   HISTORY: Past Medical, Surgical, Social, and Family History Reviewed & Updated per EMR.   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   Obesity    Transaminitis    hx mild, thought fibrate related - w/u ~2008 neg HbsAg, Hep C, ANA, nl iron/ferritin, ceruloplasmin, a1-antitrypsin, normal [place]    Past Surgical History:  Procedure Laterality Date   COLONOSCOPY  10/2015   one polyp, diverticulosis, rpt 5 yrs ([facility])   GANGLION CYST EXCISION Right 2008    wrist    Family History  Problem Relation Age of Onset   Hypertension Mother    Hyperlipidemia Father        TG   Diverticulitis Father    Diabetes Maternal Grandmother    Coronary artery disease Paternal Grandfather 71       unhealthy   Breast cancer Other 1       great grandmother, maternal   Stroke Neg Hx    Colon cancer Neg Hx    Colon polyps Neg Hx     Social History   Socioeconomic History   Marital status: Married    Spouse name: Not on file   Number of children: 2   Years of education: college   Highest education level: Not on file  Occupational History   Occupation: Personal assistant: [employer]    Comment: [employer]  Tobacco Use   Smoking status: Never   Smokeless tobacco: Never  Substance and Sexual Activity   Alcohol use: Yes    Alcohol/week: 0.0 standard drinks    Comment: Rare   Drug use: No   Sexual activity: Not  on file  Other Topics Concern   Not on file  Social History Narrative   Caffeine: occasionally.   Lives with wife [name]), 2 children (son and daughter), 1 cat   Occupation:    Activity: no regular exercise   Diet: good water, daily fruits/vegetables, red meat rarely, fish 1x/wk, cut out fast food   Social Determinants of Health   Financial Resource Strain: Not on file  Food Insecurity: Not on file  Transportation Needs: Not on file  Physical Activity: Not on file  Stress: Not on file  Social Connections: Not on file  Intimate Partner Violence: Not on file     PHYSICAL EXAM:  VS: Ht '5\' 11"'$  (1.803 m)   Wt 196 lb (88.9 kg)   BMI 27.34 kg/m  Physical Exam Gen: NAD, alert, cooperative with exam, well-appearing   Limited ultrasound: Right knee:  Trace effusion. There is a spur within the insertion of the quadricep tendon. Normal-appearing patellar tendon. Normal-appearing medial and lateral joint space  Summary: No structural changes as to the source of his pain  Ultrasound and interpretation by Clearance Coots, MD     ASSESSMENT & PLAN:   Patellofemoral pain syndrome of right  knee Knee exam is reassuring with no significant changes.  Likely patellofemoral in nature. -Counseled on home exercise therapy and supportive care. -Counseled ibuprofen. -Could consider prednisone or injection.

## 2021-03-12 NOTE — Patient Instructions (Signed)
Good to see you Please try ice  Please try ibuprofen  Please try the exercises   Please send me a message in MyChart with any questions or updates.  Please see me back in 4 weeks.   --Dr. Raeford Razor

## 2021-03-24 ENCOUNTER — Encounter: Payer: Self-pay | Admitting: Medical

## 2021-04-02 ENCOUNTER — Encounter: Payer: Self-pay | Admitting: Gastroenterology

## 2021-04-02 ENCOUNTER — Ambulatory Visit (INDEPENDENT_AMBULATORY_CARE_PROVIDER_SITE_OTHER): Payer: BC Managed Care – PPO | Admitting: Medical

## 2021-04-02 ENCOUNTER — Other Ambulatory Visit: Payer: Self-pay

## 2021-04-02 VITALS — BP 110/75 | HR 68 | Temp 98.6°F | Ht 71.0 in | Wt 198.0 lb

## 2021-04-02 DIAGNOSIS — E2740 Unspecified adrenocortical insufficiency: Secondary | ICD-10-CM

## 2021-04-02 DIAGNOSIS — M2560 Stiffness of unspecified joint, not elsewhere classified: Secondary | ICD-10-CM

## 2021-04-02 DIAGNOSIS — Z7409 Other reduced mobility: Secondary | ICD-10-CM

## 2021-04-02 DIAGNOSIS — R252 Cramp and spasm: Secondary | ICD-10-CM

## 2021-04-02 DIAGNOSIS — R5383 Other fatigue: Secondary | ICD-10-CM | POA: Diagnosis not present

## 2021-04-02 DIAGNOSIS — E7849 Other hyperlipidemia: Secondary | ICD-10-CM

## 2021-04-02 DIAGNOSIS — R700 Elevated erythrocyte sedimentation rate: Secondary | ICD-10-CM

## 2021-04-02 DIAGNOSIS — R251 Tremor, unspecified: Secondary | ICD-10-CM

## 2021-04-02 DIAGNOSIS — G629 Polyneuropathy, unspecified: Secondary | ICD-10-CM | POA: Diagnosis not present

## 2021-04-02 DIAGNOSIS — M791 Myalgia, unspecified site: Secondary | ICD-10-CM

## 2021-04-02 DIAGNOSIS — M2550 Pain in unspecified joint: Secondary | ICD-10-CM | POA: Diagnosis not present

## 2021-04-02 DIAGNOSIS — R7989 Other specified abnormal findings of blood chemistry: Secondary | ICD-10-CM

## 2021-04-02 DIAGNOSIS — D352 Benign neoplasm of pituitary gland: Secondary | ICD-10-CM

## 2021-04-02 LAB — COMPREHENSIVE METABOLIC PANEL
ALT: 17 U/L (ref 0–53)
AST: 24 U/L (ref 0–37)
Albumin: 4.5 g/dL (ref 3.5–5.2)
Alkaline Phosphatase: 63 U/L (ref 39–117)
BUN: 17 mg/dL (ref 6–23)
CO2: 28 mEq/L (ref 19–32)
Calcium: 10 mg/dL (ref 8.4–10.5)
Chloride: 100 mEq/L (ref 96–112)
Creatinine, Ser: 0.85 mg/dL (ref 0.40–1.50)
GFR: 101.83 mL/min (ref >60.00)
Glucose, Bld: 78 mg/dL (ref 70–99)
Potassium: 4 mEq/L (ref 3.5–5.1)
Sodium: 137 mEq/L (ref 135–145)
Total Bilirubin: 0.6 mg/dL (ref 0.2–1.2)
Total Protein: 7.2 g/dL (ref 6.0–8.3)

## 2021-04-02 LAB — CBC WITH DIFFERENTIAL/PLATELET
Basophils Absolute: 0 10*3/uL (ref 0.0–0.1)
Basophils Relative: 1.1 % (ref 0.0–3.0)
Eosinophils Absolute: 0.2 10*3/uL (ref 0.0–0.7)
Eosinophils Relative: 4.3 % (ref 0.0–5.0)
HCT: 39.6 % (ref 39.0–52.0)
Hemoglobin: 13.3 g/dL (ref 13.0–17.0)
Lymphocytes Relative: 41.1 % (ref 12.0–46.0)
Lymphs Abs: 1.7 10*3/uL (ref 0.7–4.0)
MCHC: 33.6 g/dL (ref 30.0–36.0)
MCV: 83 fl (ref 78.0–100.0)
Monocytes Absolute: 0.3 10*3/uL (ref 0.1–1.0)
Monocytes Relative: 7.9 % (ref 3.0–12.0)
Neutro Abs: 1.9 10*3/uL (ref 1.4–7.7)
Neutrophils Relative %: 45.6 % (ref 43.0–77.0)
Platelets: 207 10*3/uL (ref 150.0–400.0)
RBC: 4.77 Mil/uL (ref 4.22–5.81)
RDW: 14.6 % (ref 11.5–15.5)
WBC: 4.2 10*3/uL (ref 4.0–10.5)

## 2021-04-02 LAB — CK: Total CK: 90 U/L (ref 7–232)

## 2021-04-02 LAB — IRON: Iron: 93 ug/dL (ref 42–165)

## 2021-04-02 LAB — LDL CHOLESTEROL, DIRECT: Direct LDL: 110 mg/dL

## 2021-04-02 LAB — VITAMIN B12: Vitamin B-12: 1379 pg/mL — ABNORMAL HIGH (ref 211–911)

## 2021-04-02 LAB — T4, FREE: Free T4: 0.24 ng/dL — ABNORMAL LOW (ref 0.60–1.60)

## 2021-04-02 LAB — LIPID PANEL
Cholesterol: 254 mg/dL — ABNORMAL HIGH (ref 0–200)
HDL: 35.6 mg/dL — ABNORMAL LOW (ref >39.00)
NonHDL: 218.79
Total CHOL/HDL Ratio: 7
Triglycerides: 400 mg/dL — ABNORMAL HIGH (ref 0.0–149.0)
VLDL: 80 mg/dL — ABNORMAL HIGH (ref 0.0–40.0)

## 2021-04-02 LAB — C-REACTIVE PROTEIN: CRP: <1 mg/dL (ref 0.5–20.0)

## 2021-04-02 LAB — SEDIMENTATION RATE: Sed Rate: 21 mm/hr — ABNORMAL HIGH (ref 0–15)

## 2021-04-02 LAB — MAGNESIUM: Magnesium: 1.9 mg/dL (ref 1.5–2.5)

## 2021-04-02 LAB — VITAMIN D 25 HYDROXY (VIT D DEFICIENCY, FRACTURES): VITD: 92.86 ng/mL (ref 30.00–100.00)

## 2021-04-02 LAB — TSH: TSH: 3.01 u[IU]/mL (ref 0.35–5.50)

## 2021-04-02 NOTE — Addendum Note (Signed)
Addended by: Anabel Halon on: 04/02/2021 08:36 AM   Modules accepted: Orders

## 2021-04-02 NOTE — Addendum Note (Signed)
Addended by: Kelle Darting A on: 04/02/2021 08:41 AM   Modules accepted: Orders

## 2021-04-02 NOTE — Progress Notes (Signed)
Subjective:    Patient ID: Erik Cole, male    DOB: 04-Jul-1971, 50 y.o.   MRN: [id number]  HPI  Pt states when he scheduled appointment he was having some moderate to severe stiffness to his legs, core and shoulders. He states was pretty intense when he scheduled appointment. But now has eased up.   Pt in the past had some arthralgia. He has some inflammatory markers and at time when had former pain he had started on statin. He had stopped statin while back around time he saw rheumatologist.  Pt did have some upper ext cts type symptoms. He used wrist cock spints and that helps.  Pt states he fasted for 48 hours about one week ago to see if this would help. Then was eating one meal a day. He think this helped.   Pt had send my chart message wondering if he needed to see neurologist. He states at times feels like whole contracts at times but does not. Only feels weak at time he stretches in morning. No eye pain, blurred vision or headaches. Sometimes in middle of night when uses bathroom will feel slight tingle in lower ext. One morning he states had stiff robotic gait for one hour.  Pt has lost about 30 lbs since last year. This was purposeful thru diet and he does fast intermittently.  On review pt does report due for colonoscopy. Pt has got a reminder.   Prior psa was very low.    Review of Systems  Constitutional:  Negative for chills, diaphoresis and fatigue.  Respiratory:  Negative for cough, chest tightness, shortness of breath and wheezing.   Cardiovascular:  Negative for chest pain and palpitations.  Gastrointestinal:  Negative for abdominal distention, anal bleeding, constipation and diarrhea.  Genitourinary:  Negative for dysuria.  Musculoskeletal:  Positive for myalgias. Negative for back pain and joint swelling.       See hpi.  Skin:  Negative for rash.  Neurological:  Negative for dizziness, seizures, numbness and headaches.       See hpi.  Hematological:   Negative for adenopathy. Does not bruise/bleed easily.  Psychiatric/Behavioral:  Negative for behavioral problems, confusion and suicidal ideas. The patient is not nervous/anxious.    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   Obesity    Transaminitis    hx mild, thought fibrate related - w/u ~2008 neg HbsAg, Hep C, ANA, nl iron/ferritin, ceruloplasmin, a1-antitrypsin, normal [place]     Social History   Socioeconomic History   Marital status: Married    Spouse name: Not on file   Number of children: 2   Years of education: college   Highest education level: Not on file  Occupational History   Occupation: Personal assistant: [employer]    Comment: [employer]  Tobacco Use   Smoking status: Never   Smokeless tobacco: Never  Substance and Sexual Activity   Alcohol use: Yes    Alcohol/week: 0.0 standard drinks    Comment: Rare   Drug use: No   Sexual activity: Not on file  Other Topics Concern   Not on file  Social History Narrative   Caffeine: occasionally.   Lives with wife [name]), 2 children (son and daughter),  1 cat   Occupation:    Activity: no regular exercise   Diet: good water, daily fruits/vegetables, red meat rarely, fish 1x/wk, cut out fast food   Social Determinants of Health   Financial Resource Strain: Not on file  Food Insecurity: Not on file  Transportation Needs: Not on file  Physical Activity: Not on file  Stress: Not on file  Social Connections: Not on file  Intimate Partner Violence: Not on file    Past Surgical History:  Procedure Laterality Date   COLONOSCOPY  10/2015   one polyp, diverticulosis, rpt 5 yrs ([facility])   GANGLION CYST EXCISION Right 2008    wrist    Family History  Problem Relation Age of Onset    Hypertension Mother    Hyperlipidemia Father        TG   Diverticulitis Father    Diabetes Maternal Grandmother    Coronary artery disease Paternal Grandfather 53       unhealthy   Breast cancer Other 42       great grandmother, maternal   Stroke Neg Hx    Colon cancer Neg Hx    Colon polyps Neg Hx     No Active Allergies  Current Outpatient Medications on File Prior to Visit  Medication Sig Dispense Refill   ALPRAZolam (XANAX) 0.5 MG tablet Take 1 tablet (0.5 mg total) by mouth at bedtime as needed for anxiety. 15 tablet 0   Ascorbic Acid (VITAMIN C) 500 MG CAPS See admin instructions.     atorvastatin (LIPITOR) 10 MG tablet Take 1 tablet (10 mg total) by mouth daily. 90 tablet 4   atorvastatin (LIPITOR) 10 MG tablet 1 tablet     Cholecalciferol (VITAMIN D) 50 MCG (2000 UT) CAPS 1 capsule     fenofibrate micronized (ANTARA) 130 MG capsule TAKE 1 CAPSULE DAILY 90 capsule 3   naproxen sodium (ALEVE) 220 MG tablet 1 tablet with food or milk as needed     venlafaxine XR (EFFEXOR XR) 37.5 MG 24 hr capsule 1 tab daily for one week then one tab every other day 14 capsule 0   cetirizine (ZYRTEC) 10 MG tablet Take 10 mg by mouth as needed for allergies. Reported on 10/31/2015     fluticasone (FLONASE) 50 MCG/ACT nasal spray SPRAY 2 SPRAYS INTO EACH NOSTRIL EVERY DAY 16 g 1   omeprazole (PRILOSEC) 20 MG capsule Take 1 capsule (20 mg total) by mouth daily as needed.     Zinc 100 MG TABS 1 tablet     No current facility-administered medications on file prior to visit.    BP 110/75   Pulse 68   Temp 98.6 F (37 C)   Ht 5\' 11"  (1.803 m)   Wt 198 lb (89.8 kg)   SpO2 100%   BMI 27.62 kg/m        Objective:   Physical Exam  General Mental Status- Alert. General Appearance- Not in acute distress.   Skin General: Color- Normal Color. Moisture- Normal Moisture.  Neck Carotid Arteries- Normal color. Moisture- Normal Moisture. No carotid bruits. No JVD.  Chest and Lung  Exam Auscultation: Breath Sounds:-Normal.  Cardiovascular Auscultation:Rythm- Regular. Murmurs & Other Heart Sounds:Auscultation of the heart reveals- No Murmurs.  Abdomen Inspection:-Inspeection Normal. Palpation/Percussion:Note:No mass. Palpation and Percussion of the abdomen reveal- Non Tender, Non Distended + BS, no rebound or guarding.   Neurologic Cranial Nerve exam:- CN III-XII intact(No nystagmus), symmetric smile. Drift Test:- No drift. Romberg Exam:- Negative.  Heal to  Toe Gait exam:-Normal. Finger to Nose:- Normal/Intact Strength:- 5/5 equal and symmetric strength both upper and lower extremities.       Assessment & Plan:   Patient Instructions  For fatigue, myalgias, neuropathy, stiffness, rare change in gate and rare muscle cramping.  Will get cbc, cmp, tsh, t4, b12, b1, vit d, iron, magnesium and inflammatory labs.  Will see these results and then make decision on referral. May refer back to both rheumatolgist and neurologist.  Recommend cut back on fasting. If want to do just one meal a day provided you feel well no adverse signs/symptoms.  Purposeful wt loss but do go ahead and repeat scheduled colonoscopy. Psa was normal. I think you wt is good now. Recommend maintain wt.  Follow up date to be determined after lab review. No med presently. Will wait for labs then decide.      Mackie Pai, PA-C

## 2021-04-02 NOTE — Patient Instructions (Addendum)
For fatigue, myalgias, neuropathy, stiffness, rare change in gate and rare muscle cramping.  Will get cbc, cmp, tsh, t4, b12, b1, vit d, iron, magnesium and inflammatory labs.  Will see these results and then make decision on referral. May refer back to both rheumatolgist and neurologist.  Recommend cut back on fasting. If want to do just one meal a day provided you feel well no adverse signs/symptoms.  Purposeful wt loss but do go ahead and repeat scheduled colonoscopy. Psa was normal. I think you wt is good now. Recommend maintain wt.  Follow up date to be determined after lab review. No med presently. Will wait for labs then decide.   04/07/2021 Also since last visit sent my chart message with some neck pain. Also sometimes chills or tremors that cause shivering. This causes my low back, shoulders and inner left thigh to almost lock up.

## 2021-04-03 MED ORDER — PREDNISONE 10 MG (21) PO TBPK: ORAL_TABLET | ORAL | 0 refills | Status: DC

## 2021-04-03 NOTE — Addendum Note (Signed)
Addended by: Anabel Halon on: 04/03/2021 03:32 PM   Modules accepted: Orders

## 2021-04-04 LAB — ANA: Anti Nuclear Antibody (ANA): NEGATIVE

## 2021-04-04 LAB — RHEUMATOID FACTOR: Rheumatoid fact SerPl-aCnc: <14 IU/mL (ref <14)

## 2021-04-04 LAB — HLA-B27 ANTIGEN: HLA-B27 Antigen: NEGATIVE

## 2021-04-07 ENCOUNTER — Encounter: Payer: Self-pay | Admitting: Medical

## 2021-04-07 ENCOUNTER — Telehealth: Payer: Self-pay | Admitting: Medical

## 2021-04-07 DIAGNOSIS — M542 Cervicalgia: Secondary | ICD-10-CM

## 2021-04-07 LAB — VITAMIN B1: Vitamin B1 (Thiamine): 19 nmol/L (ref 8–30)

## 2021-04-07 NOTE — Addendum Note (Signed)
Addended by: Anabel Halon on: 04/07/2021 09:42 PM   Modules accepted: Orders

## 2021-04-07 NOTE — Addendum Note (Signed)
Addended by: Anabel Halon on: 04/07/2021 09:53 PM   Modules accepted: Orders

## 2021-04-07 NOTE — Telephone Encounter (Signed)
Future neck/cspine  xray placed.

## 2021-04-11 ENCOUNTER — Encounter: Payer: Self-pay | Admitting: Neurology

## 2021-04-16 ENCOUNTER — Ambulatory Visit: Payer: BC Managed Care – PPO | Admitting: Family Medicine

## 2021-05-12 ENCOUNTER — Ambulatory Visit: Payer: BC Managed Care – PPO | Admitting: Neurology

## 2021-05-13 NOTE — Progress Notes (Signed)
Office Visit Note  Patient: Erik Cole             Date of Birth: 31-Oct-1970           MRN: [id number]             PCP: Erik Pai, PA-C Referring: Erik Cole Visit Date: 05/14/2021  Subjective:  New Patient (Initial Visit) (Total body joint and muscle pain, patient has not had COVID vaccines)   History of Present Illness: Erik Cole is a 50 y.o. male here for joint and muscle pains with elevated sedimentation rate. This problem was initially evaluated in PCP clinic after symptoms onset of increased joint pains and stiffness after increased exertion shoveling snow this January. Labs were checked without very significant abnormalities and initial treatment course of prednisone improved symptoms greatly. He saw Erik Cole for evaluation of this in March and April with no new inflammatory disease treatments started. He has had recurrent symptoms with a large response to short term prednisone treatment but subsequent recurrence. He saw sports medicine clinic in June with findings for dequervain's tenosynovitis and in August with findings for patellofemoral pain syndrome particularly bone spurring at the quadriceps tendon. Extensive lab testing along the way has shown some low free T4 level otherwise unremarkable.  He continues having problems really whole body involvement with severe fatigue unintentional weight loss frequently feeling cold and with muscle stiffness and aches and decreased strength throughout.  He describes alternating feeling cold and getting hot flashes with flushing and sweating sometimes at night.  He does not notice any focal areas of swelling erythema or other inflammatory changes.  He has never had similar symptoms in the past but these have been progressively worsening for almost a year.  Labs reviewed 03/2021 ANA neg RF neg Free T4 0.24 ESR 21 CRP <1 CK 90  Activities of Daily Living:  Patient reports morning stiffness for 1 hour.    Patient Reports nocturnal pain.  Difficulty dressing/grooming: Reports Difficulty climbing stairs: Reports Difficulty getting out of chair: Reports Difficulty using hands for taps, buttons, cutlery, and/or writing: Reports  Review of Systems  Constitutional:  Positive for fatigue.  HENT:  Negative for mouth dryness.   Eyes:  Positive for dryness.  Respiratory:  Negative for shortness of breath.   Cardiovascular:  Positive for swelling in legs/feet.  Gastrointestinal:  Negative for constipation.  Endocrine: Positive for cold intolerance and heat intolerance.  Genitourinary:  Negative for difficulty urinating.  Musculoskeletal:  Positive for joint pain, joint pain, muscle weakness, morning stiffness and muscle tenderness.  Skin:  Negative for rash.  Allergic/Immunologic: Negative for susceptible to infections.  Neurological:  Positive for numbness and weakness.  Hematological:  Negative for bruising/bleeding tendency.  Psychiatric/Behavioral:  Positive for sleep disturbance.    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   Healthcare maintenance 12/31/2011   Obesity, Class I, BMI 30-34.9 07/02/2011   Anxiety, mild 11/27/2010   Dyslipidemia    GERD (gastroesophageal reflux disease)     Past Medical History:  Diagnosis Date   Anxiety  mild, xanax sparingly   Arthritis    generalized   GERD (gastroesophageal reflux disease)    hx of   History of chicken pox    HLD (hyperlipidemia)    previous PCP stopped tricor 2/2 elevated LFTs  07/2010   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   Obesity    Transaminitis    hx mild, thought fibrate related - w/u ~2008 neg HbsAg, Hep C, ANA, nl iron/ferritin, ceruloplasmin, a1-antitrypsin, normal [place]    Family History  Problem Relation Age of Onset   Hypertension Mother    Hyperlipidemia Father        TG   Diverticulitis Father    Diabetes Maternal Grandmother    Colon polyps Maternal Grandfather    Coronary artery disease Paternal Grandfather 73       unhealthy   Breast cancer Other 32       great grandmother, maternal   Stroke Neg Hx    Colon cancer Neg Hx    Esophageal cancer Neg Hx    Rectal cancer Neg Hx    Stomach cancer Neg Hx    Past Surgical History:  Procedure Laterality Date   COLONOSCOPY  10/2015   one polyp, diverticulosis, rpt 5 yrs (Erik Cole)   GANGLION CYST EXCISION Right 2008    wrist   Social History   Social History Narrative   Caffeine: occasionally.Lives with wife Erik Cole), 2 children (son and daughter), 1 catOccupation: Activity: no regular exerciseDiet: good water, daily fruits/vegetables, red meat rarely, fish 1x/wk, cut out fast food      Writes with left hand and does things with his right hand.      Lives in a two story home    Immunization History  Administered Date(s) Administered   Influenza Split 04/15/2011   Influenza,inj,Quad PF,6+ Mos 04/11/2013, 07/20/2014, 07/24/2015, 07/24/2016, 07/21/2017, 05/20/2018   Influenza-Unspecified 07/21/2017, 05/20/2018   Td 10/11/2009     Objective: Vital Signs: BP 115/72 (BP Location: Right Arm, Patient Position: Sitting, Cuff Size: Normal)   Pulse 70   Resp 15   Ht _0  (1.803 m)   Wt 204 lb (92.5 kg)   BMI 28.45 kg/m    Physical Exam HENT:     Right Ear: External ear normal.     Left Ear: External ear normal.     Mouth/Throat:     Mouth: Mucous membranes are moist.     Pharynx: Oropharynx is clear.  Eyes:     Conjunctiva/sclera: Conjunctivae normal.  Cardiovascular:      Rate and Rhythm: Normal rate and regular rhythm.  Pulmonary:     Effort: Pulmonary effort is normal.     Breath sounds: Normal breath sounds.  Musculoskeletal:     Right lower leg: No edema.     Left lower leg: No edema.  Lymphadenopathy:     Cervical: No cervical adenopathy.  Neurological:     General: No focal deficit present.     Mental Status: He is alert.  Psychiatric:        Mood and Affect: Mood normal.     Musculoskeletal Exam:  Shoulders full ROM no tenderness or swelling Elbows full ROM no tenderness or swelling Wrists full ROM no tenderness or swelling Fingers full ROM no tenderness or swelling Knees full ROM no tenderness or swelling Ankles full ROM no tenderness or swelling   Investigation: No additional findings.  Imaging: No results found.  Recent Labs: Lab Results  Component Value Date   WBC 4.2 04/02/2021  HGB 13.3 04/02/2021   PLT 207.0 04/02/2021   NA 137 04/02/2021   K 4.0 04/02/2021   CL 100 04/02/2021   CO2 28 04/02/2021   GLUCOSE 78 04/02/2021   BUN 17 04/02/2021   CREATININE 0.85 04/02/2021   BILITOT 0.6 04/02/2021   ALKPHOS 63 04/02/2021   AST 24 04/02/2021   ALT 17 04/02/2021   PROT 7.2 04/02/2021   ALBUMIN 4.5 04/02/2021   CALCIUM 10.0 04/02/2021   GFRAA >60 07/18/2018    Speciality Comments: No specialty comments available.  Procedures:  No procedures performed Allergies: Patient has no known allergies.   Assessment / Plan:     Visit Diagnoses: Myalgia   Generalized pain and stiffness some loss of muscle bulk no focal inflammatory changes or proximal weakness.  We will check aldolase level see if this is normal as well as his previously checked CK.  Total pictures somewhat suspicious for a more metabolic or endocrinopathy problem checking cortisol level this morning and repeating thyroid testing.  Unless significant muscle enzyme elevations would not recommend work-up for autoimmune myositis cause at this time.  Arthralgia  of both hands  Hand pains without any obvious inflammatory or structural changes on exam today.  This is more related to total body symptoms not concerned for inflammatory arthritis at this time.  Nontoxic multinodular goiter - Plan: Aldolase, TSH + free T4  Chronic goiter had several thyroid function tests checked this year progressively worsening free T4 level we will recheck this today.  Untreated hypothyroidism can certainly contribute to generalized myalgia.  Hot flashes  Somewhat nonspecific does have mild associated weight loss but does not report typical history for B-cell type symptoms.  Testing suspicion for endocrinopathy related problem as above.  Orders: Orders Placed This Encounter  Procedures   Aldolase   TSH + free T4   Cortisol    No orders of the defined types were placed in this encounter.    Follow-Up Instructions: No follow-ups on file.   Collier Salina, MD  Note - This record has been created using [employer].  Chart creation errors have been sought, but may not always  have been located. Such creation errors do not reflect on  the standard of medical care.

## 2021-05-14 ENCOUNTER — Other Ambulatory Visit: Payer: Self-pay

## 2021-05-14 ENCOUNTER — Ambulatory Visit (INDEPENDENT_AMBULATORY_CARE_PROVIDER_SITE_OTHER): Payer: BC Managed Care – PPO | Admitting: Internal Medicine

## 2021-05-14 ENCOUNTER — Encounter: Payer: Self-pay | Admitting: Internal Medicine

## 2021-05-14 VITALS — BP 115/72 | HR 70 | Resp 15 | Ht 71.0 in | Wt 204.0 lb

## 2021-05-14 DIAGNOSIS — R5383 Other fatigue: Secondary | ICD-10-CM | POA: Insufficient documentation

## 2021-05-14 DIAGNOSIS — E042 Nontoxic multinodular goiter: Secondary | ICD-10-CM

## 2021-05-14 DIAGNOSIS — M25541 Pain in joints of right hand: Secondary | ICD-10-CM | POA: Diagnosis not present

## 2021-05-14 DIAGNOSIS — M791 Myalgia, unspecified site: Secondary | ICD-10-CM

## 2021-05-14 DIAGNOSIS — R202 Paresthesia of skin: Secondary | ICD-10-CM

## 2021-05-14 DIAGNOSIS — M25542 Pain in joints of left hand: Secondary | ICD-10-CM

## 2021-05-14 DIAGNOSIS — R232 Flushing: Secondary | ICD-10-CM

## 2021-05-14 HISTORY — DX: Other fatigue: R53.83

## 2021-05-14 HISTORY — DX: Paresthesia of skin: R20.2

## 2021-05-14 HISTORY — DX: Flushing: R23.2

## 2021-05-14 HISTORY — DX: Myalgia, unspecified site: M79.10

## 2021-05-14 NOTE — Patient Instructions (Addendum)
Aldolase Test Why am I having this test? An aldolase test is used to help diagnose and monitor disorders that affect the muscles or liver. What is being tested? This test measures aldolase in your blood. Aldolase is a natural chemical (enzyme) that is found in most tissues of the body. It helps to convert blood sugar (glucose) into energy. The amount of aldolase in the blood increases when there is muscle or liver damage. Many different medicines can also cause increased levels of this enzyme. What kind of sample is taken? A blood sample is required for this test. It is usually collected by inserting a needle into a blood vessel. How do I prepare for this test? Follow instructions from your health care provider about eating or drinking restrictions. You may need to stop eating or drinking (may need to fast) for a short period of time before the test to get more accurate results. On the day before the test and the day of the test, avoid activities and exercises that take a lot of effort. Tell a health care provider about: All medicines you are taking, including vitamins, herbs, eye drops, creams, and over-the-counter medicines. Any medical conditions you have. How are the results reported? Your test results will be reported as values. Your health care provider will compare your results to normal ranges that were established after testing a large group of people (reference ranges). Reference ranges may vary among labs and hospitals. For this test, common reference ranges are: Adult: 1-7.5 units/L. Children: Newborn to 30 days: 6-32 units/L. Age 65 month to 6 years: 3-12 units/L. Age 52-17 years: 3.3-9.7 units/L. What do the results mean? If results are within the reference range, they are considered normal.  Increased levels of aldolase may be a sign of: A muscular disease, such as muscular dystrophy, dermatomyositis, or polymyositis. Muscle injury. Liver disease. Heart attack. Anemia. An  infection, such as trichinosis or mononucleosis. Decreased levels of aldolase may be a sign of: Muscle-wasting diseases. Being unable to absorb a natural sugar that is in things like fruit, fruit juices, and honey (hereditary fructose intolerance). Late-stage muscular dystrophy (MD). Talk with your health care provider about what your results mean. Questions to ask your health care provider Ask your health care provider, or the department that is doing the test: When will my results be ready? How will I get my results? What are my treatment options? What other tests do I need? What are my next steps? Summary An aldolase test is used to help diagnose and monitor disorders that affect the muscles or liver. Aldolase is a natural chemical (enzyme) that is found in most tissues of the body. The amount of aldolase in the blood increases when there is muscle or liver damage. For this test, a blood sample is usually collected by inserting a needle into a blood vessel. Talk with your health care provider about what your results mean.   Cortisol Test Why am I having this test? Cortisol is a hormone that plays an important role in many bodily functions. It helps regulate blood pressure and blood sugar (glucose) levels, aids digestion, and triggers the "fight or flight" reaction that your body uses to respond to stressful situations. You may have a cortisol test to check for problems with the glands that are associated with cortisol production (pituitary gland and adrenal glands). This test may be done to help diagnose these conditions: Hypopituitarism. Acute adrenal crisis. A tumor of the adrenal gland. Cushing syndrome or disease. Addison disease,  also called adrenal insufficiency. What is being tested? This test measures the amount of cortisol in your body. What kind of sample is taken? This test may be done using a sample of blood, urine, or saliva. Cortisol levels will vary throughout the  day. Your health care provider may ask that cortisol tests be taken at different times of the day. One or more tests may be done to confirm results. The most common way to measure cortisol in the body is to measure it in the blood. Blood samples are usually collected at 8:00 a.m. (when cortisol is highest) or at 4:00 p.m. (when it is lower). In some cases, it may be taken at midnight when cortisol is lowest. A blood sample is usually collected by inserting a needle into a blood vessel. Saliva tests are usually done around midnight. This is done by swabbing the inside of your cheeks with a cotton swab. Urine samples might include only one sample, or you may be asked to collect all of the urine you produce during a 24-hour period. How do I collect samples at home? You may be asked to collect urine at home, possibly over a period of 24 hours. Follow instructions from a health care provider about how to collect the sample. When collecting a urine sample at home, make sure you: Use supplies and instructions that you received from the lab. Collect urine only in the germ-free (sterile) container that you received from the lab. Do not let any toilet paper or stool (feces) get into the container. Refrigerate the sample until you can return it to the lab. Return the sample to the lab as instructed. How do I prepare for this test? How you prepare for this test depends on what kind of sample will be taken. If you will have a blood test, you may be instructed to: Avoid exercise that takes a lot of effort (vigorous exercise) before the test. Avoid stress before the test. Stop taking certain medicines. These may include: Certain anti-seizure medicines. Androgens. Estrogen. Glucocorticoids (cortisone). If you will have a saliva test, follow instructions from your health care provider about when to stop eating, drinking, and using toothpaste before the test. If you will have a urine test, no preparation is  needed. Tell a health care provider about: Any allergies you have. All medicines you are taking, including vitamins, herbs, eye drops, creams, and over-the-counter medicines. Any blood disorders you have. Any surgeries you have had. Any medical conditions you have. Whether you are pregnant or may be pregnant. How are the results reported? Your test results will be reported as a value that tells you how much cortisol is in your body. Different units may be used for blood, saliva, and urine. Your health care provider will compare your results to normal ranges that were established after testing a large group of people (reference ranges). Reference ranges may vary among labs and hospitals. For this test, common reference ranges also vary depending on the sample: Blood: Adult: morning blood test (8:00 a.m.): 5-23 mcg/dL. Adult: evening blood test (4:00 p.m.): 3-13 mcg/dL. Child 1-16 years: morning blood test (8:00 a.m.): 3-21 mcg/dL. Child 1-16 years: evening blood test (4:00 p.m.): 3-10 mcg/dL. Newborn: 1-24 mcg/dL. Saliva: 7:00 a.m. to 9:00 a.m.: 100-750 ng/dL. 3:00 p.m. to 5:00 p.m.: less than 401 ng/dL. 11:00 p.m. to midnight: less than 100 ng/dL. Urine: Adult: less than 100 mcg per 24 hours. Adolescent: 5-55 mcg per 24 hours. Child: 2-27 mcg per 24 hours. What do the  results mean? Results within the reference ranges are considered normal. A cortisol level that is higher than normal may mean that you have: Cushing syndrome. Tumors near the pituitary gland or on an adrenal gland. A cortisol level that is lower than normal may be caused by: Addison disease. Certain medicines, such as chemotherapy and glucocorticoid medicines. Hypopituitarism. If you have an abnormal result, you may need more than one cortisol test because cortisol levels can vary throughout the day and affect your test results. Talk with your health care provider about what your results mean. Questions to ask your  health care provider Ask your health care provider, or the department that is doing the test: When will my results be ready? How will I get my results? What are my treatment options? What other tests do I need? What are my next steps? Summary A cortisol test may be done to check for problems with the glands associated with cortisol production (pituitary gland and adrenal glands). This test may be done using a sample of blood, urine, or saliva. A cortisol level that is higher than normal can be seen in Cushing syndrome and in adrenal or pituitary gland tumors. A cortisol level that is lower than normal can be seen in Addison disease, with use of certain medicines, and in hypopituitarism.  Quadriceps Rehab Ask your health care provider which exercises are safe for you. Do exercises exactly as told by your health care provider and adjust them as directed. It is normal to feel mild stretching, pulling, tightness, or discomfort as you do these exercises. Stop right away if you feel sudden pain or your pain gets worse. Do not begin these exercises until told by your health care provider. Stretching and range-of-motion exercises These exercises warm up your muscles and joints and improve the movement and flexibility of your thigh. These exercises can also help to relieve pain, numbness, and tingling. Knee flexion, active This is an exercise in which you use your effort to move your knee joint (active flexion). Lie on your back with both legs straight. If this causes back discomfort, bend your healthy knee so your foot is flat on the floor. Slowly slide your left / right heel back toward your buttocks (flexion). Stop when you feel a gentle stretch in the front of your knee or thigh (quadriceps).  Knee extension, passive This is an exercise in which your knee joint is straightened with no effort on your part (passive extension). Sit with your left / right heel propped on a chair, a coffee table, or a  footstool. Do not have anything under your knee to support it. Allow your leg muscles to relax, letting gravity straighten out your knee. You should feel a stretch behind your left / right knee. Strengthening exercises These exercises build strength and endurance in your thigh. Endurance is the ability to use your muscles for a long time, even after your muscles get tired. Do these exercises while wearing your brace if told by your health care provider. Quadriceps, isometric This is an exercise in which you stretch the muscles in front of your thigh (quadriceps) without moving your knee joint (isometric). Lie on your back with your left / right leg extended and your other knee bent. If told by your health care provider, put a small pillow or rolled towel under your extended knee. Slowly tense the muscles in the front of your left / right thigh. You should see your kneecap slide up toward your hip or see increased  dimpling just above the knee. This motion will push the back of your knee down toward the floor. Straight leg raises, supine This exercise stretches the muscles in front of your thigh (quadriceps) and the muscles that move your hips (hip flexors). Lie on your back (supine position) with your left / right leg extended and your other knee bent. Tense the muscles in the front of your left / right thigh. You should see your kneecap slide up or see increased dimpling just above the knee. Keep these muscles tight while you raise your leg 4-6 inches (10-15 cm) off the floor. Do not let your knee bend. Hold this position for 2 seconds. Keep the muscles tense and keep your knee straight as you lower your leg. Relax the muscles slowly and completely after each repetition. Repeat 10 times. Complete this exercise 3 times a day.

## 2021-05-15 LAB — TSH+FREE T4: TSH W/REFLEX TO FT4: 2.84 mIU/L (ref 0.40–4.50)

## 2021-05-15 LAB — CORTISOL: Cortisol, Plasma: 1.1 ug/dL — ABNORMAL LOW

## 2021-05-15 LAB — ALDOLASE: Aldolase: 5.2 U/L (ref <=8.1)

## 2021-05-16 ENCOUNTER — Encounter: Payer: Self-pay | Admitting: Neurology

## 2021-05-16 ENCOUNTER — Other Ambulatory Visit: Payer: Self-pay

## 2021-05-16 ENCOUNTER — Ambulatory Visit (INDEPENDENT_AMBULATORY_CARE_PROVIDER_SITE_OTHER): Payer: BC Managed Care – PPO | Admitting: Neurology

## 2021-05-16 ENCOUNTER — Other Ambulatory Visit (INDEPENDENT_AMBULATORY_CARE_PROVIDER_SITE_OTHER): Payer: BC Managed Care – PPO

## 2021-05-16 VITALS — BP 118/85 | HR 75 | Ht 71.0 in | Wt 203.0 lb

## 2021-05-16 DIAGNOSIS — M791 Myalgia, unspecified site: Secondary | ICD-10-CM

## 2021-05-16 NOTE — Progress Notes (Signed)
I left a voicemail trying to call back regarding results. Aldolase is normal indicating no muscle inflammation. Very low cortisol of 1.1, normal is at least 4.0 or higher. This is the normal steroid hormone in the body. Combination of this with him having normal TSH with very low T4. I am suspicious his problems are from these hormonal type deficiencies causing fatigue, cold, muscle aches, weight change. 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

## 2021-05-16 NOTE — Progress Notes (Signed)
[facility] Clinic Note - Initial Visit   Date: 05/16/21  Rachit Grim MRN: [id number] DOB: [birth year]   Dear Mackie Pai, PA-C:  Thank you for your kind referral of Karell Tukes for consultation of muscle stiffness. Although his history is well known to you, please allow [place] to reiterate it for the purpose of our medical record. The patient was accompanied to the clinic by wife who also provides collateral information.     History of Present Illness: Max Nuno is a 50 y.o. ambidextrous-handed male with hyperlipidemia, GERD, and anxiety/depression presenting for evaluation of muscle stiffness.  Patient was in his usual state of good health until earlier in 2022.  He slipped and fell on ice in January, but did not recall having any pain.  A few weeks later, he began having muscle soreness over the shoulders, followed by stiffness involving the torso, back, and thighs.  He has difficulty standing upright after sitting for about 15-20 minutes, because of stiffness in his hips and back.  He says that it typically takes another 15-20 minutes for him to be able to straighten up.  Symptoms are worse in the morning. He also has soreness involving the shoulder muscles, thighs, and abdomen.  He was on prednisone taper early in the spring and fall, both times, it helped alleviate his pain, but only lasted about a week.  His symptoms returned once he completed the steroid course. He denies numbness/tingling or weakness. He walks unassisted, no falls. He has been evaluated by rheumatology and had negative autoimmune work-up.  He also reports loss of hair over his lower legs and hot flashes.   He works as a Government social research officer.  Nonsmoker and does not consume alcohol.   Out-side paper records, electronic medical record, and images have been reviewed where available and summarized as:  Lab Results  Component Value Date   HGBA1C 5.8 11/16/2018   Lab Results   Component Value Date   VITAMINB12 1,379 (H) 04/02/2021   Lab Results  Component Value Date   TSH 3.01 04/02/2021   Lab Results  Component Value Date   ESRSEDRATE 21 (H) 04/02/2021    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   Ascorbic Acid (VITAMIN C) 500 MG CAPS See admin instructions.   Cholecalciferol (VITAMIN D) 50 MCG (2000 UT) CAPS 1 capsule   IBUPROFEN PO Take by mouth.   Multiple Vitamins-Minerals (MULTIVITAMIN WITH MINERALS) tablet Take 1 tablet by mouth daily.   naproxen sodium (ALEVE) 220 MG tablet 1 tablet with food or milk as needed   atorvastatin (LIPITOR) 10 MG tablet Take 1 tablet (10 mg total) by mouth daily. (  Patient not taking: No sig reported)   atorvastatin (LIPITOR) 10 MG tablet 1 tablet (Patient not taking: No sig reported)   fenofibrate micronized (ANTARA) 130 MG capsule TAKE 1 CAPSULE DAILY (Patient not taking: No sig reported)   predniSONE (STERAPRED UNI-PAK 21 TAB) 10 MG (21) TBPK tablet Standard Taper over 6 days. (Patient not taking: No sig reported)   venlafaxine XR (EFFEXOR XR) 37.5 MG 24 hr capsule 1 tab daily for one week then one tab every other day (Patient not taking: No sig reported)   No  facility-administered encounter medications on file as of 05/16/2021.    Allergies: No Known Allergies  Family History: Family History  Problem Relation Age of Onset   Hypertension Mother    Hyperlipidemia Father        TG   Diverticulitis Father    Diabetes Maternal Grandmother    Coronary artery disease Paternal Grandfather 81       unhealthy   Breast cancer Other 43       great grandmother, maternal   Stroke Neg Hx    Colon cancer Neg Hx    Colon polyps Neg Hx     Social History: Social History   Tobacco Use   Smoking status: Never   Smokeless tobacco: Never  Vaping Use   Vaping Use: Never used  Substance Use Topics   Alcohol use: Not Currently    Comment: Rare   Drug use: No   Social History   Social History Narrative   Caffeine: occasionally.Lives with wife [name]), 2 children (son and daughter), 1 catOccupation: Activity: no regular exerciseDiet: good water, daily fruits/vegetables, red meat rarely, fish 1x/wk, cut out fast food      Writes with left hand and does things with his right hand.      Lives in a two story home     Vital Signs:  BP 118/85   Pulse 75   Ht 5\' 11"  (1.803 m)   Wt 203 lb (92.1 kg)   SpO2 100%   BMI 28.31 kg/m     Neurological Exam: MENTAL STATUS including orientation to time, place, person, recent and remote memory, attention span and concentration, language, and fund of knowledge is normal.  Speech is not dysarthric.  CRANIAL NERVES: II:  No visual field defects.   III-IV-VI: Pupils equal round and reactive to light.  Normal conjugate, extra-ocular eye movements in all directions of gaze.  No nystagmus.  No ptosis.   V:  Normal facial sensation.    VII:  Normal facial symmetry and movements.   VIII:  Normal hearing and vestibular function.   IX-X:  Normal palatal movement.   XI:  Normal shoulder shrug and head rotation.   XII:  Normal tongue strength and range of motion, no deviation or fasciculation.  MOTOR:  No  atrophy, fasciculations or abnormal movements.  No pronator drift.   Upper Extremity:  Right  Left  Deltoid  5/5   5/5   Biceps  5/5   5/5   Triceps  5/5   5/5   Infraspinatus 5/5  5/5  Medial pectoralis 5/5  5/5  Wrist extensors  5/5   5/5   Wrist flexors  5/5   5/5   Finger extensors  5/5   5/5   Finger flexors  5/5   5/5   Dorsal interossei  5/5   5/5   Abductor pollicis  5/5   5/5   Tone (Ashworth scale)  0  0  Lower Extremity:  Right  Left  Hip flexors  5/5   5/5   Hip extensors  5/5   5/5   Adductor 5/5  5/5  Abductor 5/5  5/5  Knee flexors  5/5   5/5   Knee extensors  5/5   5/5   Dorsiflexors  5/5   5/5   Plantarflexors  5/5   5/5   Toe extensors  5/5   5/5   Toe flexors  5/5   5/5   Tone (Ashworth scale)  0  0   MSRs:  Right        Left                  brachioradialis 2+  2+  biceps 2+  2+  triceps 2+  2+  patellar 2+  2+  ankle jerk 2+  2+  Hoffman no  no  plantar response down  down   SENSORY:  Normal and symmetric perception of light touch, pinprick, vibration, and proprioception.  Romberg's sign absent.   COORDINATION/GAIT: Normal finger-to- nose-finger and heel-to-shin.  Intact rapid alternating movements bilaterally.  Able to rise from a chair without using arms.  Gait narrow based and stable. Tandem and stressed gait intact.    IMPRESSION: Muscle stiffness and myalgias.  Extensive rheumatological evaluation has been essentially normal.  His neurological exam is also normal, specifically, no evidence of neuropathy.  To be complete, I will check for Stiffperson syndrome with GAD65 antibody. However, I would expect to see hyperreflexia and increased tone on exam, which he does not have.  I also offered NCS/EMG to further investigate his symptoms, which he would like to consider based on his lab results.  No exam findings to suggest spinal cord pathology, therefore, imaging is not indicated.  He was also recently found to have low cortisol and given his  constellation of symptoms, it was suggested to also seek the opinion of endocrinology.    Thank you for allowing me to participate in patient's care.  If I can answer any additional questions, I would be pleased to do so.    Sincerely,    Dannah Ryles K. Posey Pronto, DO

## 2021-05-16 NOTE — Patient Instructions (Addendum)
Lab will be checked.  We will notify you of the results and let you know the next step.

## 2021-05-18 LAB — GLUTAMIC ACID DECARBOXYLASE AUTO ABS: Glutamic Acid Decarb Ab: <5 IU/mL (ref <5)

## 2021-05-19 ENCOUNTER — Other Ambulatory Visit: Payer: Self-pay | Admitting: *Deleted

## 2021-05-19 ENCOUNTER — Telehealth: Payer: Self-pay

## 2021-05-19 DIAGNOSIS — R899 Unspecified abnormal finding in specimens from other organs, systems and tissues: Secondary | ICD-10-CM

## 2021-05-19 NOTE — Telephone Encounter (Signed)
Patient called stating he was returning Dr. Marveen Reeks call regarding the results of his labwork.  Patient was also checking the status of his referral to Endocrinology and if he still needs a follow-up appointment.

## 2021-05-19 NOTE — Telephone Encounter (Signed)
Dr. Benjamine Mola - does patient need to keep f/u appt 05/28/2021? Please advise.  Patient called, lab results explained, referral for endocrinology placed, patient verbalized understanding.

## 2021-05-19 NOTE — Telephone Encounter (Signed)
Agree with cancelling follow up appointment here. I also called Erik Cole to speak briefly on results. I discussed that this suspected problem is not typically damaging and not an emergency. He feels symptoms are very disruptive to his lifestyle. I told him if routine referral is scheduled for more than 2 or 3 months out we can try to ask if urgent/sooner would be possible.

## 2021-05-22 ENCOUNTER — Encounter: Payer: Self-pay | Admitting: Internal Medicine

## 2021-05-23 NOTE — Telephone Encounter (Signed)
We can try changing the referral to urgent, endocrinology availability is usually pretty hard. I will try to reach out to one of the loca endocrinologists to see if he has any availability or can answer the specific question about the colonoscopy in the meantime.

## 2021-05-28 ENCOUNTER — Ambulatory Visit: Payer: BC Managed Care – PPO | Admitting: Internal Medicine

## 2021-05-30 ENCOUNTER — Other Ambulatory Visit: Payer: Self-pay

## 2021-05-30 ENCOUNTER — Ambulatory Visit (AMBULATORY_SURGERY_CENTER): Payer: BC Managed Care – PPO

## 2021-05-30 ENCOUNTER — Encounter: Payer: Self-pay | Admitting: Gastroenterology

## 2021-05-30 VITALS — Ht 71.0 in | Wt 200.0 lb

## 2021-05-30 DIAGNOSIS — Z86010 Personal history of colonic polyps: Secondary | ICD-10-CM

## 2021-05-30 MED ORDER — SUTAB 1479-225-188 MG PO TABS: 1.0000 | ORAL_TABLET | ORAL | 0 refills | Status: DC

## 2021-05-30 NOTE — Progress Notes (Signed)
Pre visit completed via phone call; patient verified name, DOB, and address;  No egg or soy allergy known to patient  No issues known to pt with past sedation with any surgeries or procedures Patient denies ever being told they had issues or difficulty with intubation  No FH of Malignant Hyperthermia Pt is not on diet pills Pt is not on home 02  Pt is not on blood thinners  Pt denies issues with constipation at this time; No A fib or A flutter Coupon given to pt in PV today, Code to Pharmacy and NO PA's for preps discussed with pt in PV today  Discussed with pt there will be an out-of-pocket cost for prep and that varies from $0 to 70 + dollars - pt verbalized understanding  Due to the COVID-19 pandemic we are asking patients to follow certain guidelines in PV and the [facility] [place]   Pt aware of COVID protocols and [facility] guidelines

## 2021-06-06 ENCOUNTER — Other Ambulatory Visit: Payer: Self-pay | Admitting: Medical

## 2021-06-09 NOTE — Telephone Encounter (Addendum)
Requesting: alprazolam Contract: 10/08/17 UDS: 10/08/17  Last Visit: 04/02/21 Next Visit: none Last Refill: 01/14/21  Please Advise  What pharmacy does he want me to send in the medication?  [facility]. Want to send to local pharmacy/in state.  Would you load local pharmacy and let me know done.

## 2021-06-10 ENCOUNTER — Encounter: Payer: Self-pay | Admitting: Internal Medicine

## 2021-06-10 NOTE — Telephone Encounter (Signed)
Local pharmacy addded

## 2021-06-10 NOTE — Addendum Note (Signed)
Addended by: Jeronimo Greaves on: 06/10/2021 09:23 AM   Modules accepted: Orders

## 2021-06-11 MED ORDER — ALPRAZOLAM 0.5 MG PO TABS: 0.5000 mg | ORAL_TABLET | Freq: Every evening | ORAL | 0 refills | Status: DC | PRN

## 2021-06-11 NOTE — Telephone Encounter (Signed)
Rx  xanax sent to pt pharmacy.

## 2021-06-11 NOTE — Addendum Note (Signed)
Addended by: Anabel Halon on: 06/11/2021 08:23 AM   Modules accepted: Orders

## 2021-06-13 ENCOUNTER — Encounter: Payer: Self-pay | Admitting: Gastroenterology

## 2021-06-13 ENCOUNTER — Other Ambulatory Visit: Payer: Self-pay

## 2021-06-13 ENCOUNTER — Ambulatory Visit (AMBULATORY_SURGERY_CENTER): Payer: BC Managed Care – PPO | Admitting: Gastroenterology

## 2021-06-13 VITALS — BP 98/66 | HR 64 | Temp 97.6°F | Resp 12 | Ht 71.0 in | Wt 200.0 lb

## 2021-06-13 DIAGNOSIS — Z86010 Personal history of colonic polyps: Secondary | ICD-10-CM | POA: Diagnosis not present

## 2021-06-13 MED ORDER — SODIUM CHLORIDE 0.9 % IV SOLN: 500.0000 mL | Freq: Once | INTRAVENOUS | Status: DC

## 2021-06-13 NOTE — Progress Notes (Signed)
Pt's states no medical or surgical changes since previsit or office visit. 

## 2021-06-13 NOTE — Progress Notes (Signed)
A and O x3. Report to RN. Tolerated MAC anesthesia well. 

## 2021-06-13 NOTE — Progress Notes (Signed)
[facility] History and Physical   Primary Care Physician:  Mackie Pai, PA-C   Reason for Procedure:   History of colon polyps  Plan:    colonoscopy     HPI: Erik Cole is a 50 y.o. male  here for colonoscopy surveillance - history of SSP removed 10/2015. Patient denies any bowel symptoms at this time but has had some episodes of diverticulitis since I've seen him. No family history of colon cancer known. Otherwise feels well without any cardiopulmonary symptoms.    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 Authorizing Provider  ALPRAZolam Duanne Moron) 0.5 MG tablet Take 1 tablet (0.5 mg total) by mouth at bedtime as needed for anxiety. 06/11/21  Yes Saguier, Percell Miller, PA-C  Cholecalciferol (VITAMIN D) 50 MCG (2000 UT) CAPS Take 1 capsule by mouth daily at 6 (six) AM.   Yes [provider]  IBUPROFEN PO Take 1 tablet by mouth every 8 (eight) hours as needed.   Yes [provider]  Multiple Vitamin (MULTIVITAMIN PO) Take 1 tablet by mouth daily at 6 (six) AM.   Yes [provider]  Omega-3 Fatty Acids (FISH OIL PO) Take 1 capsule by mouth daily at 6 (six) AM.   Yes [provider]  Ascorbic Acid (VITAMIN C) 500 MG CAPS Take 1 tablet by mouth daily as needed.     [provider]  atorvastatin (LIPITOR) 10 MG tablet Take 1 tablet (10 mg total) by mouth daily. Patient not taking: Reported on 05/14/2021 03/04/20   Saguier, Percell Miller, PA-C  fenofibrate micronized (ANTARA) 130 MG capsule TAKE 1 CAPSULE DAILY Patient not taking: Reported on 05/14/2021 05/29/19   Saguier, Percell Miller, PA-C  naproxen sodium (ALEVE) 220 MG tablet Take 220 mg by mouth daily as needed.    [provider]    Current Outpatient Medications  Medication Sig Dispense Refill   ALPRAZolam (XANAX) 0.5 MG tablet Take 1 tablet (0.5 mg total) by mouth at bedtime as needed for anxiety. 15 tablet 0   Cholecalciferol (VITAMIN D) 50 MCG (2000 UT) CAPS Take 1 capsule by mouth daily at 6 (six) AM.     IBUPROFEN PO Take 1 tablet by mouth every 8 (eight) hours as needed.     Multiple Vitamin (MULTIVITAMIN PO) Take 1 tablet by mouth daily at 6 (six) AM.     Omega-3 Fatty Acids (FISH OIL PO) Take 1 capsule by mouth daily at 6 (six) AM.     Ascorbic Acid (VITAMIN C) 500 MG CAPS Take 1 tablet by mouth daily as needed.     atorvastatin (LIPITOR) 10 MG tablet Take 1 tablet (10 mg total) by mouth daily. (Patient not taking: Reported on 05/14/2021) 90 tablet 4  fenofibrate micronized (ANTARA) 130 MG capsule TAKE 1 CAPSULE DAILY (Patient not taking: Reported on 05/14/2021) 90 capsule 3   naproxen sodium (ALEVE) 220 MG tablet Take 220 mg by mouth daily as needed.     Current Facility-Administered Medications  Medication Dose Route Frequency Provider Last Rate Last Admin   0.9 %  sodium chloride infusion  500 mL Intravenous Once Iverson Sees, Carlota Raspberry, MD        Allergies as of 06/13/2021   (No Known Allergies)    Family History  Problem Relation Age of Onset   Hypertension Mother    Hyperlipidemia Father        TG   Diverticulitis Father    Diabetes Maternal Grandmother    Colon polyps Maternal Grandfather    Coronary artery disease Paternal Grandfather 69       unhealthy   Breast cancer  Other 1       great grandmother, maternal   Stroke Neg Hx    Colon cancer Neg Hx    Esophageal cancer Neg Hx    Rectal cancer Neg Hx    Stomach cancer Neg Hx     Social History   Socioeconomic History   Marital status: Married    Spouse name: Not on file   Number of children: 2   Years of education: college   Highest education level: Not on file  Occupational History   Occupation: Personal assistant: OTHER    Comment: [employer]  Tobacco Use   Smoking status: Never   Smokeless tobacco: Never  Vaping Use   Vaping Use: Never used  Substance and Sexual Activity   Alcohol use: Not Currently    Comment: Rare   Drug use: No   Sexual activity: Not on file  Other Topics Concern   Not on file  Social History Narrative   Caffeine: occasionally.Lives with wife [name]), 2 children (son and daughter), 1 catOccupation: Activity: no regular exerciseDiet: good water, daily fruits/vegetables, red meat rarely, fish 1x/wk, cut out fast food      Writes with left hand and does things with his right hand.      Lives in a two story home    Social Determinants of Health   Financial Resource Strain: Not on file  Food Insecurity: Not on file  Transportation Needs: Not on file  Physical Activity: Not on file  Stress: Not on file  Social Connections: Not on file  Intimate Partner Violence: Not on file    Review of Systems: All other review of systems negative except as mentioned in the HPI.  Physical Exam: Vital signs BP (!) 121/94   Pulse 81   Temp 97.6 F (36.4 C)   Resp 12   Ht 5\' 11"  (1.803 m)   Wt 200 lb (90.7 kg)   SpO2 100%   BMI 27.89 kg/m   General:   Alert,  Well-developed, pleasant and cooperative in NAD Lungs:  Clear throughout to auscultation.   Heart:  Regular rate and rhythm Abdomen:  Soft, nontender and nondistended.   Neuro/Psych:  Alert and cooperative. Normal mood and affect. A and O x 3  Jolly Mango, MD [facility]

## 2021-06-13 NOTE — Patient Instructions (Addendum)
Handouts on diverticulosis and hemorrhoids given to you today   YOU HAD AN ENDOSCOPIC PROCEDURE TODAY AT [facility]:   Refer to the procedure report that was given to you for any specific questions about what was found during the examination.  If the procedure report does not answer your questions, please call your gastroenterologist to clarify.  If you requested that your care partner not be given the details of your procedure findings, then the procedure report has been included in a sealed envelope for you to review at your convenience later.  YOU SHOULD EXPECT: Some feelings of bloating in the abdomen. Passage of more gas than usual.  Walking can help get rid of the air that was put into your GI tract during the procedure and reduce the bloating. If you had a lower endoscopy (such as a colonoscopy or flexible sigmoidoscopy) you may notice spotting of blood in your stool or on the toilet paper. If you underwent a bowel prep for your procedure, you may not have a normal bowel movement for a few days.  Please Note:  You might notice some irritation and congestion in your nose or some drainage.  This is from the oxygen used during your procedure.  There is no need for concern and it should clear up in a day or so.  SYMPTOMS TO REPORT IMMEDIATELY:  Following lower endoscopy (colonoscopy or flexible sigmoidoscopy):  Excessive amounts of blood in the stool  Significant tenderness or worsening of abdominal pains  Swelling of the abdomen that is new, acute  Fever of 100F or higher  For urgent or emergent issues, a gastroenterologist can be reached at any hour by calling [phone]. Do not use MyChart messaging for urgent concerns.    DIET:  We do recommend a small meal at first, but then you may proceed to your regular diet.  Drink plenty of fluids but you should avoid alcoholic beverages for 24 hours.  ACTIVITY:  You should plan to take it easy for the rest of today and you  should NOT DRIVE or use heavy machinery until tomorrow (because of the sedation medicines used during the test).    FOLLOW UP: Our staff will call the number listed on your records 48-72 hours following your procedure to check on you and address any questions or concerns that you may have regarding the information given to you following your procedure. If we do not reach you, we will leave a message.  We will attempt to reach you two times.  During this call, we will ask if you have developed any symptoms of COVID 19. If you develop any symptoms (ie: fever, flu-like symptoms, shortness of breath, cough etc.) before then, please call [phone].  If you test positive for Covid 19 in the 2 weeks post procedure, please call and report this information to [place].     SIGNATURES/CONFIDENTIALITY: You and/or your care partner have signed paperwork which will be entered into your electronic medical record.  These signatures attest to the fact that that the information above on your After Visit Summary has been reviewed and is understood.  Full responsibility of the confidentiality of this discharge information lies with you and/or your care-partner.

## 2021-06-13 NOTE — Progress Notes (Signed)
C.W. vital signs. 

## 2021-06-13 NOTE — Op Note (Signed)
[place] Patient Name: Erik Cole: 06/13/2021 8:29 AM MRN: [id number] Endoscopist: Remo Lipps P. Havery Moros , MD Age: 50 Referring MD:  Cole of Birth: August 01, 1970 Gender: Male Account #: [id number] Procedure:                Colonoscopy Indications:              High risk colon cancer surveillance: Personal                            history of colonic polyps - sessile serrated polyp                            removed 10/2015 Medicines:                Monitored Anesthesia Care Procedure:                Pre-Anesthesia Assessment:                           - Prior to the procedure, a History and Physical                            was performed, and patient medications and                            allergies were reviewed. The patient's tolerance of                            previous anesthesia was also reviewed. The risks                            and benefits of the procedure and the sedation                            options and risks were discussed with the patient.                            All questions were answered, and informed consent                            was obtained. Prior Anticoagulants: The patient has                            taken no previous anticoagulant or antiplatelet                            agents. ASA Grade Assessment: II - A patient with                            mild systemic disease. After reviewing the risks                            and benefits, the patient was deemed in  satisfactory condition to undergo the procedure.                           After obtaining informed consent, the colonoscope                            was passed under direct vision. Throughout the                            procedure, the patient's blood pressure, pulse, and                            oxygen saturations were monitored continuously. The                            Olympus CF-HQ190L [id number]) Colonoscope  was                            introduced through the anus and advanced to the the                            cecum, identified by appendiceal orifice and                            ileocecal valve. The colonoscopy was performed                            without difficulty. The patient tolerated the                            procedure well. The quality of the bowel                            preparation was adequate. The ileocecal valve,                            appendiceal orifice, and rectum were photographed. Scope In: 8:34:37 AM Scope Out: 8:53:33 AM Scope Withdrawal Time: 0 hours 15 minutes 10 seconds  Total Procedure Duration: 0 hours 18 minutes 56 seconds  Findings:                 The perianal and digital rectal examinations were                            normal.                           Many medium-mouthed diverticula were found in the                            transverse colon and left colon.                           Internal hemorrhoids were found during  retroflexion. The hemorrhoids were small.                           The exam was otherwise without abnormality. Complications:            No immediate complications. Estimated blood loss:                            None. Estimated Blood Loss:     Estimated blood loss: none. Impression:               - Diverticulosis in the transverse colon and in the                            left colon.                           - Internal hemorrhoids.                           - The examination was otherwise normal.                           - No polyps Recommendation:           - Patient has a contact number available for                            emergencies. The signs and symptoms of potential                            delayed complications were discussed with the                            patient. Return to normal activities tomorrow.                            Written discharge instructions were  provided to the                            patient.                           - Resume previous diet.                           - Continue present medications.                           - Repeat colonoscopy in 10 years for surveillance. Remo Lipps P. Daena Alper, MD 06/13/2021 8:58:57 AM This report has been signed electronically.

## 2021-06-17 ENCOUNTER — Telehealth: Payer: Self-pay

## 2021-06-17 NOTE — Telephone Encounter (Signed)
  Follow up Call-  Call back number 06/13/2021  Post procedure Call Back phone  # [phone]  Permission to leave phone message Yes  Some recent data might be hidden     Patient questions:                 Do you have a fever, pain , or abdominal swelling? No. Pain Score  0 *  Have you tolerated food without any problems? Yes.    Have you been able to return to your normal activities? Yes.    Do you have any questions about your discharge instructions: Diet   No. Medications  No. Follow up visit  No.  Do you have questions or concerns about your Care? No.  Actions: * If pain score is 4 or above: No action needed, pain <4.  Have you developed a fever since your procedure? no  2.   Have you had an respiratory symptoms (SOB or cough) since your procedure? no  3.   Have you tested positive for COVID 19 since your procedure no  4.   Have you had any family members/close contacts diagnosed with the COVID 19 since your procedure?  no   If yes to any of these questions please route to Joylene John, RN and Joella Prince, RN

## 2021-06-22 ENCOUNTER — Encounter: Payer: Self-pay | Admitting: Medical

## 2021-06-23 NOTE — Addendum Note (Signed)
Addended by: Anabel Halon on: 06/23/2021 08:24 PM   Modules accepted: Orders

## 2021-06-25 ENCOUNTER — Ambulatory Visit: Payer: BC Managed Care – PPO | Admitting: Neurology

## 2021-07-24 ENCOUNTER — Ambulatory Visit: Payer: BC Managed Care – PPO | Admitting: Family Medicine

## 2021-07-24 ENCOUNTER — Encounter: Payer: Self-pay | Admitting: Family Medicine

## 2021-07-24 ENCOUNTER — Ambulatory Visit: Payer: Self-pay

## 2021-07-24 VITALS — BP 114/80 | Ht 71.0 in | Wt 200.0 lb

## 2021-07-24 DIAGNOSIS — M7022 Olecranon bursitis, left elbow: Secondary | ICD-10-CM | POA: Diagnosis not present

## 2021-07-24 DIAGNOSIS — M7021 Olecranon bursitis, right elbow: Secondary | ICD-10-CM

## 2021-07-24 HISTORY — DX: Olecranon bursitis, right elbow: M70.21

## 2021-07-24 NOTE — Patient Instructions (Signed)
Good to see you Please try compression  Please try ice  Please avoid resting on the elbows   Please send me a message in MyChart with any questions or updates.  Please see me back in 3-4 weeks or as needed if better.   --Dr. Raeford Razor

## 2021-07-24 NOTE — Progress Notes (Signed)
°  Erik Cole - 51 y.o. male MRN [id number]  Date of birth: Jul 14, 1970  SUBJECTIVE:  Including CC & ROS.  No chief complaint on file.   Erik Cole is a 51 y.o. male that is presenting with bilateral elbow swelling.  It occurs in the posterior aspect of the elbow.  He does lift up on his elbows and rest his elbows on the table more, computer.  No redness or streaking.  Has full range of motion.  Review of his lab work on 11/2 shows that his cortisol was low Review office note from 11/2 shows lab work was done and concern for endocrinopathy  Review of Systems See HPI   HISTORY: Past Medical, Surgical, Social, and Family History Reviewed & Updated per EMR.   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   Obesity    Transaminitis    hx mild, thought fibrate related - w/u ~2008 neg HbsAg, Hep C, ANA, nl iron/ferritin, ceruloplasmin, a1-antitrypsin, normal [place]    Past Surgical History:  Procedure Laterality Date   COLONOSCOPY  10/2015   one polyp, diverticulosis, rpt 5 yrs ([facility])   GANGLION CYST EXCISION Right 2008    wrist     PHYSICAL EXAM:  VS: BP 114/80 (BP Location: Right Arm, Patient Position: Sitting)    Ht 5\' 11"  (1.803 m)    Wt 200 lb (90.7 kg)    BMI 27.89 kg/m  Physical Exam Gen: NAD, alert, cooperative with exam, well-appearing MSK:  Neurovascularly intact    Limited ultrasound: Right and left elbow:  Normal insertion of the triceps tendon to the olecranon. Fairly large olecranon bursitis appreciated on the left and right side.  Summary: Olecranon bursitis  Ultrasound and interpretation by Clearance Coots, MD    ASSESSMENT & PLAN:   Olecranon  bursitis of both elbows No inflammatory or infectious origin appreciated on ultrasound.  Likely more traumatic given his weakness in his pelvis and helping him get up from a seated position -Counseled on home exercise therapy and supportive care. -Counseled on compression. -Counseled on ibuprofen. -Could consider aspiration.

## 2021-07-24 NOTE — Assessment & Plan Note (Signed)
No inflammatory or infectious origin appreciated on ultrasound.  Likely more traumatic given his weakness in his pelvis and helping him get up from a seated position -Counseled on home exercise therapy and supportive care. -Counseled on compression. -Counseled on ibuprofen. -Could consider aspiration.

## 2021-08-11 ENCOUNTER — Ambulatory Visit: Payer: BC Managed Care – PPO | Admitting: Endocrinology

## 2021-08-11 ENCOUNTER — Encounter: Payer: Self-pay | Admitting: Endocrinology

## 2021-08-11 ENCOUNTER — Other Ambulatory Visit: Payer: Self-pay

## 2021-08-11 DIAGNOSIS — E236 Other disorders of pituitary gland: Secondary | ICD-10-CM

## 2021-08-11 DIAGNOSIS — R7989 Other specified abnormal findings of blood chemistry: Secondary | ICD-10-CM | POA: Insufficient documentation

## 2021-08-11 DIAGNOSIS — E230 Hypopituitarism: Secondary | ICD-10-CM

## 2021-08-11 HISTORY — DX: Other specified abnormal findings of blood chemistry: R79.89

## 2021-08-11 MED ORDER — COSYNTROPIN 0.25 MG IJ SOLR
0.2500 mg | Freq: Once | INTRAMUSCULAR | Status: AC
  Administered 2021-08-11: 0.25 mg via INTRAVENOUS

## 2021-08-11 NOTE — Patient Instructions (Signed)
Blood tests are requested for you today.  We'll let you know about the results.  

## 2021-08-11 NOTE — Progress Notes (Signed)
Subjective:    Patient ID: Erik Cole, male    DOB: 23-May-1971, 51 y.o.   MRN: [id number]  HPI Pt is referred by Erik Cole, for low cortisol level.  Pt was noted to have low cortisol level in late 2022.  no h/o abdominal or brain injury.  No h/o cancer, seizures, hypoglycemia, amyloidosis, tuberculosis, or diabetes.  No recent steroids.  No h/o ketoconazole, rifampin, or dilantin.  He has never had pituitary imaging.  He has 1 year of diffuse body pain, loss of body hair, lightening of the skin tone, cold intolerance, nausea, difficulty with concentration, and fatigue.  He lost 30 lbs in 2022.  No recent weight change.   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   Obesity    Transaminitis    hx mild, thought fibrate related - w/u ~2008 neg HbsAg, Hep C, ANA, nl iron/ferritin, ceruloplasmin, a1-antitrypsin, normal [place]    Past Surgical History:  Procedure Laterality Date   COLONOSCOPY  10/2015   one polyp, diverticulosis, rpt 5 yrs (Erik Cole)   GANGLION CYST EXCISION Right 2008    wrist    Social History   Socioeconomic History   Marital status: Married    Spouse name: Not on file   Number of children: 2   Years of education: college   Highest education level: Not on file  Occupational History   Occupation: Personal assistant: OTHER    Comment: [employer]  Tobacco Use   Smoking status: Never   Smokeless tobacco: Never  Vaping Use   Vaping Use: Never used  Substance and Sexual Activity   Alcohol use: Not Currently    Comment: Rare   Drug use: No   Sexual activity: Not on file  Other Topics Concern   Not on file  Social History Narrative   Caffeine: occasionally.Lives with wife Erik Cole), 2  children (son and daughter), 1 catOccupation: Activity: no regular exerciseDiet: good water, daily fruits/vegetables, red meat rarely, fish 1x/wk, cut out fast food      Writes with left hand and does things with his right hand.      Lives in a two story home    Social Determinants of Health   Financial Resource Strain: Not on file  Food Insecurity: Not on file  Transportation Needs: Not on file  Physical Activity: Not on file  Stress: Not on file  Social Connections: Not on file  Intimate Partner Violence: Not on file    Current Outpatient Medications on File Prior to Visit  Medication Sig Dispense Refill   ALPRAZolam (XANAX) 0.5 MG tablet Take 1 tablet (0.5 mg total) by mouth at bedtime as needed for anxiety. 15 tablet 0   Ascorbic Acid (VITAMIN C) 500 MG CAPS Take 1 tablet by mouth daily as needed.     Cholecalciferol (VITAMIN D) 50 MCG (2000 UT) CAPS Take 1 capsule by mouth daily at 6 (six) AM.     IBUPROFEN PO Take 1 tablet by mouth every 8 (eight) hours as needed.     Multiple Vitamin (MULTIVITAMIN PO) Take 1 tablet by mouth daily at 6 (six) AM.     naproxen sodium (ALEVE)  220 MG tablet Take 220 mg by mouth daily as needed.     Omega-3 Fatty Acids (FISH OIL PO) Take 1 capsule by mouth daily at 6 (six) AM.     No current facility-administered medications on file prior to visit.    No Known Allergies  Family History  Problem Relation Age of Onset   Hypertension Mother    Hyperlipidemia Father        TG   Diverticulitis Father    Diabetes Maternal Grandmother    Colon polyps Maternal Grandfather    Coronary artery disease Paternal Grandfather 12       unhealthy   Breast cancer Other 22       great grandmother, maternal   Stroke Neg Hx    Colon cancer Neg Hx    Esophageal cancer Neg Hx    Rectal cancer Neg Hx    Stomach cancer Neg Hx    Adrenal disorder Neg Hx     BP 124/90 (BP Location: Left Arm, Patient Position: Sitting, Cuff Size: Normal)    Pulse 83    Ht 5'  11" (1.803 m)    Wt 200 lb 12.8 oz (91.1 kg)    SpO2 97%    BMI 28.01 kg/m    Review of Systems Denies dizziness, headache, and nocturia.      Objective:   Physical Exam VS: see vs page GEN: no distress HEAD: head: no deformity eyes: no periorbital swelling, no proptosis external nose and ears are normal NECK: supple, thyroid is not enlarged CHEST WALL: no deformity LUNGS: clear to auscultation CV: reg rate and rhythm, no murmur.  MUSCULOSKELETAL: gait is normal and steady EXTEMITIES: no deformity.  no leg edema NEURO:  readily moves all 4's.  sensation is intact to touch on all 4's.   SKIN:  Normal texture and temperature.  No rash or suspicious lesion is visible.  No vitiligo.   NODES:  None palpable at the neck PSYCH: alert, well-oriented.  Does not appear anxious nor depressed.    outside test results are reviewed: E2=88 PSA=0.02 Testosterone=24 TSH=3.8 Free T4=0.3 Cortisol=1.4 TPO=0.1  CT (2017) normal adrenals.   Lab Results  Component Value Date   TSH 3.01 04/02/2021   Lab Results  Component Value Date   CREATININE 0.85 04/02/2021   BUN 17 04/02/2021   NA 137 04/02/2021   K 4.0 04/02/2021   CL 100 04/02/2021   CO2 28 04/02/2021   [place]: MNG: no f/u needed  I have reviewed outside records, and summarized: Pt was noted to have low cortisol, and referred here.  He has seen several specialists to eval this.    ACTH stimulation test is done: baseline cortisol level=1 then Cosyntropin 250 mcg is given im 45 minutes later, cortisol level=7 (subnormal response)  Lab Results  Component Value Date   TSH 2.60 08/11/2021  Free T4=0.34 Lab Results  Component Value Date   TESTOSTERONE <3 (L) 08/11/2021      Assessment & Plan:  Pituitary insuff, uncertain etiology and prognosis.  Uncontrolled.  I have sent a prescription to your pharmacy, for prednisone and synthroid.  Check MRI MNG: no f/u is needed. Please come back for a follow-up appointment in 1  month.

## 2021-08-12 ENCOUNTER — Encounter: Payer: Self-pay | Admitting: Endocrinology

## 2021-08-12 DIAGNOSIS — E230 Hypopituitarism: Secondary | ICD-10-CM

## 2021-08-12 HISTORY — DX: Hypopituitarism: E23.0

## 2021-08-12 LAB — BASIC METABOLIC PANEL
BUN: 20 mg/dL (ref 6–23)
CO2: 24 mEq/L (ref 19–32)
Calcium: 9.7 mg/dL (ref 8.4–10.5)
Chloride: 102 mEq/L (ref 96–112)
Creatinine, Ser: 0.76 mg/dL (ref 0.40–1.50)
GFR: 105.07 mL/min (ref >60.00)
Glucose, Bld: 88 mg/dL (ref 70–99)
Potassium: 4 mEq/L (ref 3.5–5.1)
Sodium: 137 mEq/L (ref 135–145)

## 2021-08-12 LAB — T3, FREE: T3, Free: 3.1 pg/mL (ref 2.3–4.2)

## 2021-08-12 LAB — FOLLICLE STIMULATING HORMONE: FSH: 1.5 m[IU]/mL (ref 1.4–18.1)

## 2021-08-12 LAB — CORTISOL
Cortisol, Plasma: 1 ug/dL
Cortisol, Plasma: 6.7 ug/dL

## 2021-08-12 LAB — LUTEINIZING HORMONE: LH: 0.41 m[IU]/mL — ABNORMAL LOW (ref 1.50–9.30)

## 2021-08-12 LAB — T4, FREE: Free T4: 0.34 ng/dL — ABNORMAL LOW (ref 0.60–1.60)

## 2021-08-12 LAB — TSH: TSH: 2.6 u[IU]/mL (ref 0.35–5.50)

## 2021-08-12 MED ORDER — PREDNISONE 5 MG PO TABS: 5.0000 mg | ORAL_TABLET | Freq: Every day | ORAL | 3 refills | Status: DC

## 2021-08-12 MED ORDER — LEVOTHYROXINE SODIUM 50 MCG PO TABS: 50.0000 ug | ORAL_TABLET | Freq: Every day | ORAL | 3 refills | Status: DC

## 2021-08-13 ENCOUNTER — Other Ambulatory Visit: Payer: Self-pay

## 2021-08-14 LAB — ACTH: C206 ACTH: 13 pg/mL (ref 6–50)

## 2021-08-16 LAB — TESTOSTERONE,FREE AND TOTAL: Testosterone: <3 ng/dL — ABNORMAL LOW (ref 264–916)

## 2021-08-20 ENCOUNTER — Telehealth: Payer: Self-pay | Admitting: Endocrinology

## 2021-08-20 NOTE — Telephone Encounter (Signed)
Pt is calling in stating that he needs a pre-cert for him to have his [facility] done [facility] [employer]) and they are able to get him in if they can get a pre-cert for him.  The previous referral office that  he was sent to stated that they do not do MRI.  Pt would like to have a call back once it has been sent to that office.

## 2021-08-21 ENCOUNTER — Telehealth: Payer: Self-pay

## 2021-08-21 ENCOUNTER — Telehealth (HOSPITAL_BASED_OUTPATIENT_CLINIC_OR_DEPARTMENT_OTHER): Payer: Self-pay

## 2021-08-21 NOTE — Telephone Encounter (Signed)
Hey can you F/U with this. Wife LVM stating that Imaging has been approved but [facility] told them that our office has to notify them to let them know that Pre Approval has been obtained before they will schedule him.  Thank you,  PK

## 2021-08-21 NOTE — Telephone Encounter (Signed)
Patient's insurance company called to obtain authorization/approval for the patient's MRI. Insurance company was provided with the  Facility: [employer] (NPI [id number])  CPT code given as well as ICD code.  Approval number: [id number]

## 2021-08-22 ENCOUNTER — Encounter: Payer: Self-pay | Admitting: Family Medicine

## 2021-08-22 LAB — PROLACTIN W/DILUTION: PROLACTIN,UNDILUTED: 16.4 ng/mL (ref 2.0–18.0)

## 2021-08-22 LAB — ESTRADIOL, FREE
Estradiol, Free: <0.04 pg/mL
Estradiol: <2 pg/mL (ref <=29)

## 2021-08-22 LAB — TEST AUTHORIZATION

## 2021-08-22 LAB — PROLACTIN: Prolactin: 16.4 ng/mL (ref 2.0–18.0)

## 2021-09-01 ENCOUNTER — Encounter: Payer: Self-pay | Admitting: Endocrinology

## 2021-09-10 ENCOUNTER — Ambulatory Visit: Payer: BC Managed Care – PPO | Admitting: Endocrinology

## 2021-09-11 ENCOUNTER — Telehealth: Payer: Self-pay

## 2021-09-11 NOTE — Telephone Encounter (Signed)
Received a message from [place] from [facility] and she stated that the pt is supposed to have a MRI of the brain w/wo contrast on 09/13/2021. Authorization is on file and in place but it is at the wrong facility. The referral should be for [facility] but the insurance has [facility].  ? ?She can be reached @ [phone]. ? ?Can you F/U with this to see what needs to be done. ? ?Thank you, ? ?Leamon Arnt ? ?

## 2021-09-12 ENCOUNTER — Encounter: Payer: Self-pay | Admitting: Endocrinology

## 2021-09-12 ENCOUNTER — Telehealth: Payer: Self-pay

## 2021-09-12 ENCOUNTER — Telehealth: Payer: Self-pay | Admitting: *Deleted

## 2021-09-12 ENCOUNTER — Telehealth (HOSPITAL_BASED_OUTPATIENT_CLINIC_OR_DEPARTMENT_OTHER): Payer: Self-pay

## 2021-09-12 DIAGNOSIS — D352 Benign neoplasm of pituitary gland: Secondary | ICD-10-CM

## 2021-09-12 NOTE — Telephone Encounter (Signed)
[place] stated that the insurancew was put in wrong and the pt can not have the MRI tomorrow until it is fixed. ? ?The person that LVM was named [place] from [facility] ? ? ?

## 2021-09-12 NOTE — Telephone Encounter (Addendum)
Pt called and stated that Dr. Loanne Drilling ordered an MRI, it was scheduled at [facility] for tomorrow 09/13/21. [facility] called pt today and said the insurance was put in incorrectly therefore they can not do the MRI. I see in patient chart that Erik Cole has already notified referring physician. I notified physician office that pt wants to keep that appt tomorrow if the insurance error can be fixed today, he is quite upset. I called him back to update him on the situation. ?

## 2021-09-13 ENCOUNTER — Ambulatory Visit (HOSPITAL_BASED_OUTPATIENT_CLINIC_OR_DEPARTMENT_OTHER): Payer: BC Managed Care – PPO

## 2021-09-13 ENCOUNTER — Encounter (HOSPITAL_BASED_OUTPATIENT_CLINIC_OR_DEPARTMENT_OTHER): Payer: Self-pay

## 2021-09-15 NOTE — Telephone Encounter (Signed)
See previous phone message.  New authorization submitted. ?

## 2021-09-15 NOTE — Telephone Encounter (Signed)
Resubmitted authorization for MRI.  Left message informing Tillie Rung at preservice center. ?

## 2021-09-17 ENCOUNTER — Other Ambulatory Visit: Payer: Self-pay

## 2021-09-17 ENCOUNTER — Ambulatory Visit (HOSPITAL_BASED_OUTPATIENT_CLINIC_OR_DEPARTMENT_OTHER)
Admission: RE | Admit: 2021-09-17 | Discharge: 2021-09-17 | Disposition: A | Discharge status: Home / Self Care | Payer: BC Managed Care – PPO | Source: Ambulatory Visit | Attending: Endocrinology | Admitting: Endocrinology

## 2021-09-17 DIAGNOSIS — E230 Hypopituitarism: Secondary | ICD-10-CM | POA: Diagnosis not present

## 2021-09-17 IMAGING — MR MR HEAD WO/W CM
25 series · 48 of 48 positions shown · IV contrast (gadavist)
Comparison: None available.

CLINICAL DATA: Initial evaluation for new onset hypopituitarism.

EXAM:
MRI HEAD WITHOUT AND WITH CONTRAST
TECHNIQUE: Multiplanar, multiecho pulse sequences of the brain and surrounding
structures were obtained without and with intravenous contrast. A
pituitary protocol was utilized.
CONTRAST:  9mL GADAVIST GADOBUTROL 1 MMOL/ML IV SOLN

[Series 2: T1 · sagittal · 5.0mm · 0.47mm/px · 3 of 27 slices shown (1 of 5)]
[im 1/27]
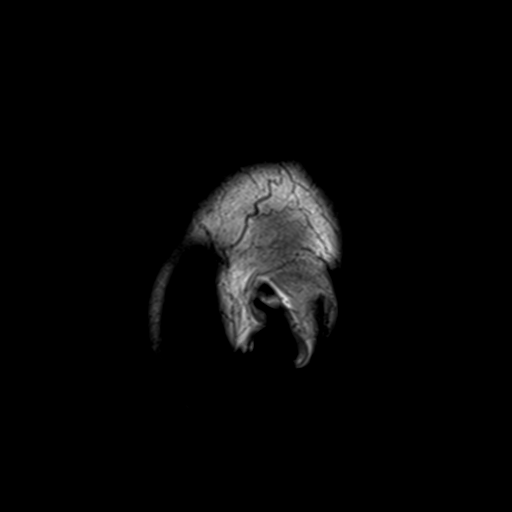
[im 14/27]
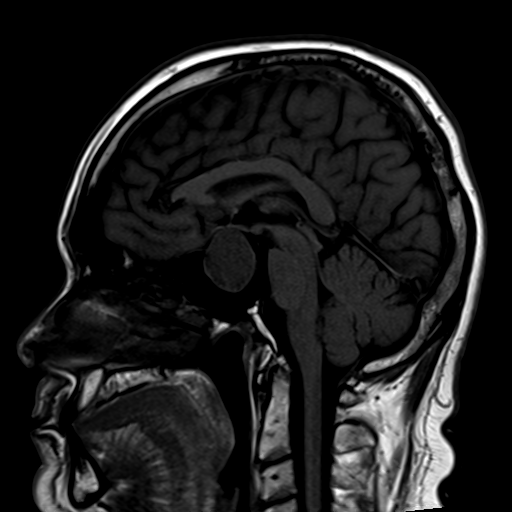
[im 27/27]
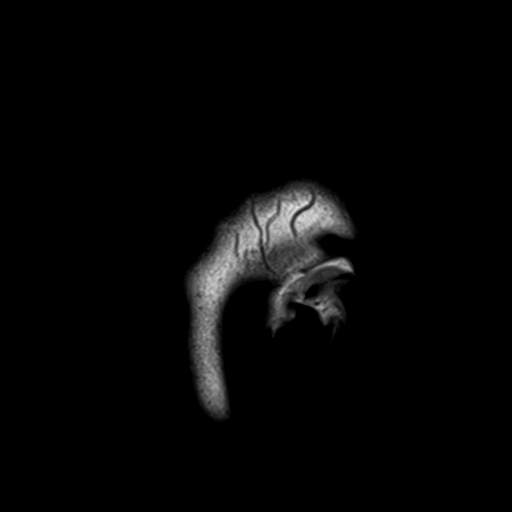

[Series 3: DWI · axial · 3.0mm · 1.25mm/px · z∈[-98,+73]mm · 7 of 116 slices shown (1 of 2)]
[im 1/116]
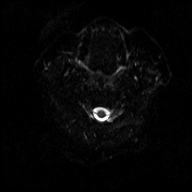
[im 20/116]
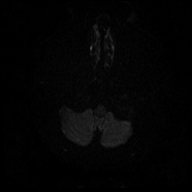
[im 39/116]
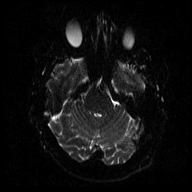
[im 58/116]
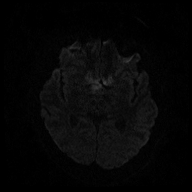
[im 77/116]
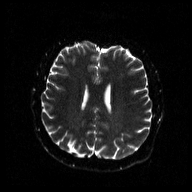
[im 96/116]
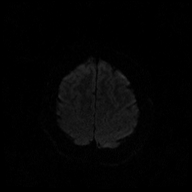
[im 116/116]
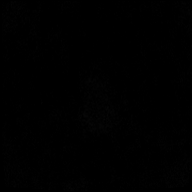

[Series 4: DWI · axial · 3.0mm · 1.25mm/px · z∈[-98,+73]mm · 4 of 57 slices shown (2 of 2)]
[im 1/57]
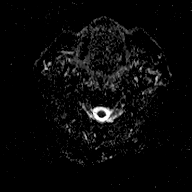
[im 19/57]
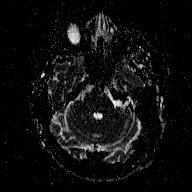
[im 38/57]
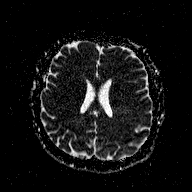
[im 57/57]
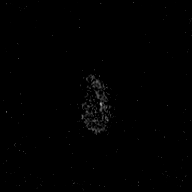

[Series 5: T2 · axial · 5.0mm · 0.72mm/px · z∈[-98,+69]mm · 2 of 25 slices shown (1 of 3)]
[im 1/25]
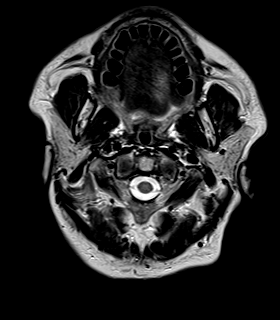
[im 25/25]
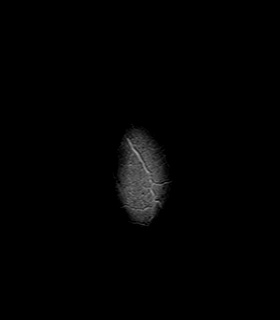

[Series 6: T2 · axial · 5.0mm · 0.72mm/px · z∈[-98,+69]mm · 2 of 25 slices shown (2 of 3)]
[im 1/25]
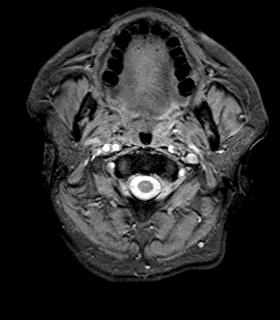
[im 25/25]
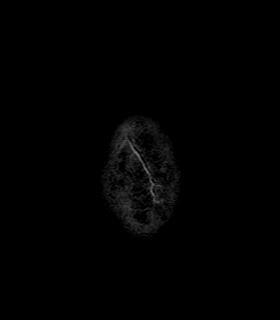

[Series 7: FLAIR · axial · 5.0mm · 0.72mm/px · z∈[-98,+69]mm · 2 of 25 slices shown]
[im 1/25]
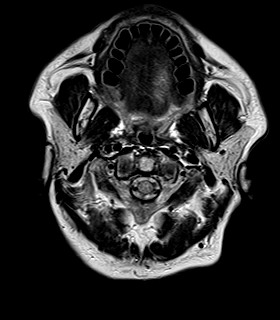
[im 25/25]
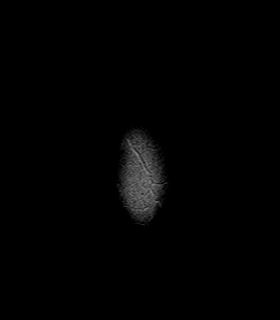

[Series 8: T1 · sagittal · 3.0mm · 0.50mm/px · 1 of 17 slices shown (2 of 5)]
[im 1/17]
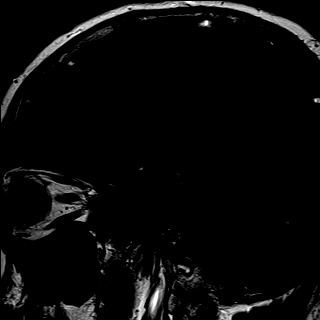

[Series 9: T2 · coronal · 3.0mm · 0.50mm/px · 1 of 18 slices shown (3 of 3)]
[im 1/18]
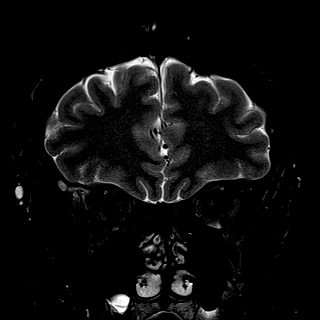

[Series 10: T1 · coronal · 3.0mm · 0.42mm/px · 1 of 18 slices shown (3 of 5)]
[im 1/18]
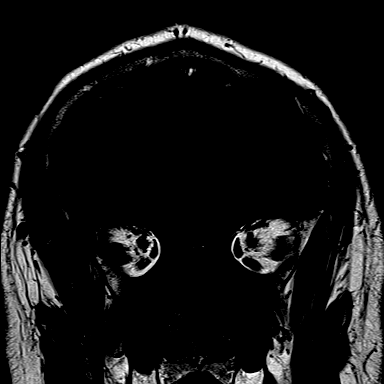

[Series 11: T1 · coronal · non-contrast · 3.0mm · 0.62mm/px · 1 of 18 slices shown (4 of 5)]
[im 1/18]
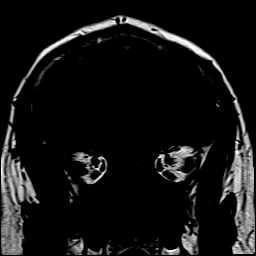

[Series 12: T1 post-contrast · coronal · 3.0mm · 0.62mm/px · 1 of 18 slices shown (1 of 8)]
[im 1/18]
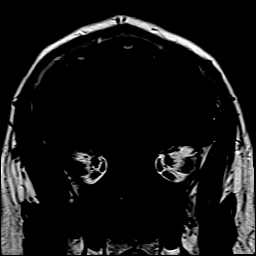

[Series 13: T1 post-contrast · coronal · 3.0mm · 0.62mm/px · 1 of 18 slices shown (2 of 8)]
[im 1/18]
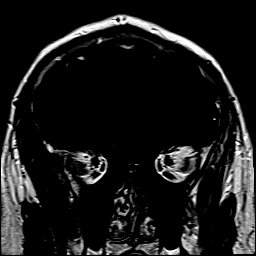

[Series 14: T1 post-contrast · coronal · 3.0mm · 0.62mm/px · 1 of 18 slices shown (3 of 8)]
[im 1/18]
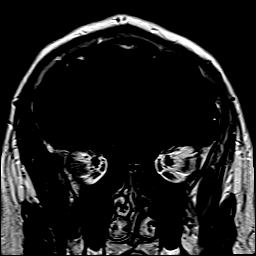

[Series 15: T1 post-contrast · coronal · 3.0mm · 0.62mm/px · 1 of 18 slices shown (4 of 8)]
[im 1/18]
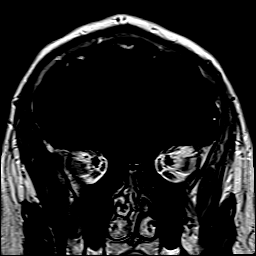

[Series 16: T1 post-contrast · coronal · 3.0mm · 0.62mm/px · 1 of 18 slices shown (5 of 8)]
[im 1/18]
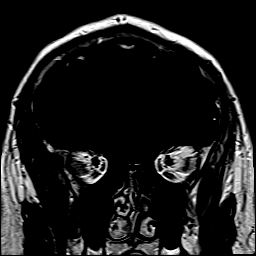

[Series 17: T1 post-contrast · coronal · 3.0mm · 0.62mm/px · 1 of 18 slices shown (6 of 8)]
[im 1/18]
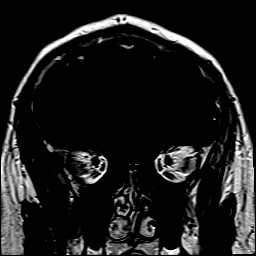

[Series 18: T1 post-contrast · coronal · 3.0mm · 0.42mm/px · 1 of 18 slices shown (7 of 8)]
[im 1/18]
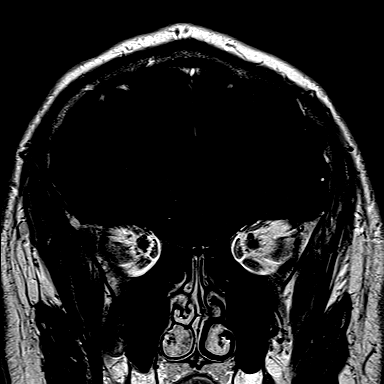

[Series 19: T1 post-contrast · sagittal · 3.0mm · 0.50mm/px · 1 of 17 slices shown (8 of 8)]
[im 1/17]
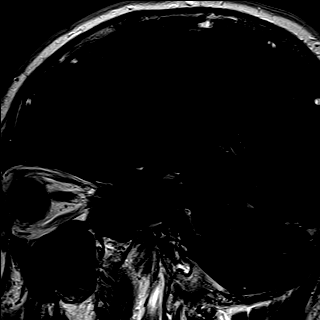

[Series 20: T1 · axial · 1.0mm · 1.00mm/px · z∈[-94,+64]mm · 10 of 160 slices shown (5 of 5)]
[im 1/160]
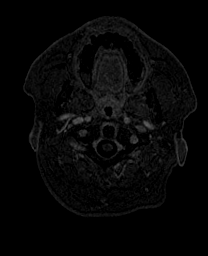
[im 18/160]
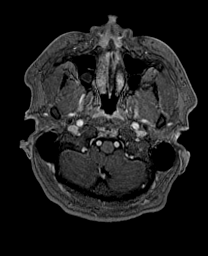
[im 36/160]
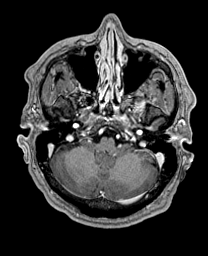
[im 54/160]
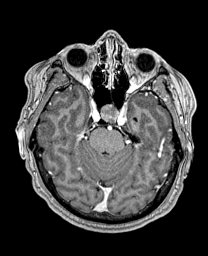
[im 71/160]
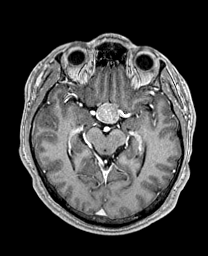
[im 89/160]
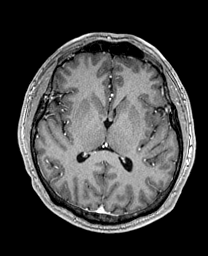
[im 107/160]
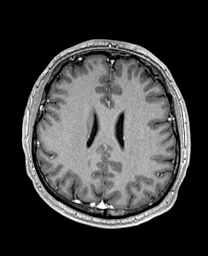
[im 124/160]
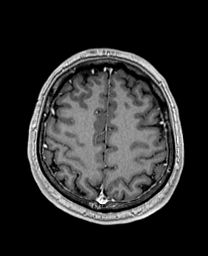
[im 142/160]
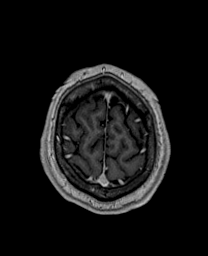
[im 160/160]
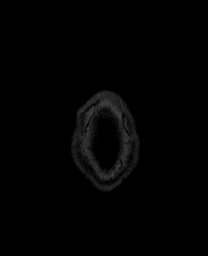

[Series 100: sub_s12-s11_1 · coronal · 3.0mm · 0.62mm/px · 1 of 18 slices shown]
[im 1/18]
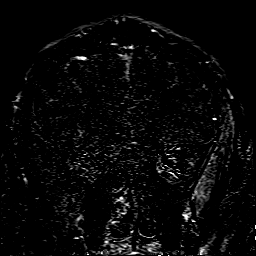

[Series 101: sub_s13-s11_1 · coronal · 3.0mm · 0.62mm/px · 1 of 18 slices shown]
[im 1/18]
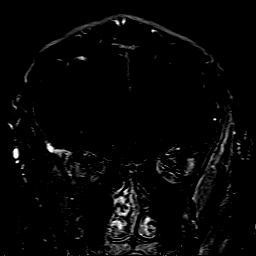

[Series 102: sub_s14-s11_1 · coronal · 3.0mm · 0.62mm/px · 1 of 18 slices shown]
[im 1/18]
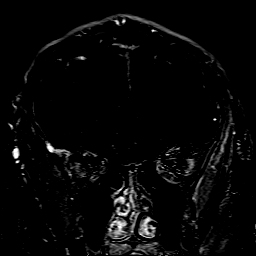

[Series 103: sub_s15-s11_1 · coronal · 3.0mm · 0.62mm/px · 1 of 18 slices shown]
[im 1/18]
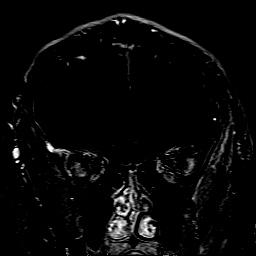

[Series 104: sub_s16-s11_1 · coronal · 3.0mm · 0.62mm/px · 1 of 18 slices shown]
[im 1/18]
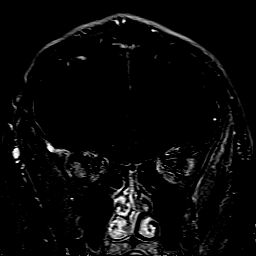

[Series 105: sub_s17-s11_1 · coronal · 3.0mm · 0.62mm/px · 1 of 18 slices shown]
[im 1/18]
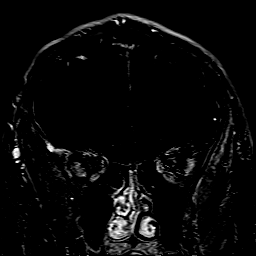

[48 of 48 positions shown; findings below may reference images not displayed]

FINDINGS: Brain: Cerebral volume within normal limits. No focal parenchymal
signal abnormality within the brain itself. No evidence for acute or
subacute infarct. Gray-white matter differentiation maintained. No
areas of chronic cortical infarction. No acute or chronic
intracranial blood products. No mass lesion within the brain
parenchyma. No midline shift or mass effect. No hydrocephalus or
extra-axial fluid collection.

Thin section dynamic phase sequences were performed through the
pituitary gland and sella. There is a ovoid well-circumscribed mass
measuring 2.9 x 2.5 x 2.3 cm (craniocaudad by AP by transverse)
involving the pituitary gland and sella, most consistent with a
pituitary macro adenoma. Sella is expanded and protrudes inferiorly
into the sphenoid sinuses. Superior extension into the suprasellar
cistern. Mass effect on the optic chiasm which is compressed
superiorly, greater on the right (series 9, image 11). Lateral
margins of the lesion abuts the cavernous segments of both ICAs
without overt cavernous sinus invasion.

Vascular: Major intracranial vascular flow voids are maintained.

Skull and upper cervical spine: Craniocervical junction within
normal limits. Bone marrow signal intensity normal. No scalp soft
tissue abnormality.

Sinuses/Orbits: Globes orbital soft tissues within normal limits.
Small right maxillary sinus retention cyst. Paranasal sinuses are
otherwise largely clear. No mastoid effusion. Inner ear structures
grossly normal.

Other: None.
IMPRESSION: 1. 2.9 x 2.5 x 2.3 cm sellar/suprasellar mass, consistent with a
pituitary macroadenoma. Secondary mass effect on the optic chiasm
superiorly.
2. Otherwise normal brain MRI.

## 2021-09-17 MED ORDER — GADOBUTROL 1 MMOL/ML IV SOLN
9.0000 mL | Freq: Once | INTRAVENOUS | Status: AC | PRN
  Administered 2021-09-17: 9 mL via INTRAVENOUS

## 2021-09-18 ENCOUNTER — Other Ambulatory Visit: Payer: Self-pay

## 2021-09-18 ENCOUNTER — Encounter: Payer: Self-pay | Admitting: Medical

## 2021-09-18 ENCOUNTER — Ambulatory Visit: Payer: BC Managed Care – PPO | Admitting: Endocrinology

## 2021-09-18 DIAGNOSIS — D352 Benign neoplasm of pituitary gland: Secondary | ICD-10-CM

## 2021-09-18 HISTORY — DX: Benign neoplasm of pituitary gland: D35.2

## 2021-09-18 NOTE — Patient Instructions (Addendum)
Blood tests are requested for you today.  We'll let you know about the results.   ?Please see your eye specialist, as scheduled.  Please bring the MRI report.   ?Please see a neurosurgery specialist.  you will receive a phone call, about a day and time for an appointment.   ?

## 2021-09-18 NOTE — Telephone Encounter (Signed)
See other notes

## 2021-09-18 NOTE — Addendum Note (Signed)
Addended by: Anabel Halon on: 09/18/2021 05:09 PM ? ? Modules accepted: Orders ? ?

## 2021-09-18 NOTE — Progress Notes (Signed)
? ?Subjective:  ? ? Patient ID: Erik Cole, male    DOB: [birth year], 51 y.o.   MRN: [id number] ? ?HPI ?Pt returns for f/u of low cortisol level (dx'ed 2022) ?He has these PFT: ?ACTH: needs and takes prednisone ?FSH/LH: low ?TSH: needs and takes synthroid ?GH: checking today ?Prolactin: normal ?VP: BMET is normal ?Since on meds, pt states he feels better in general.  He has regained a few lbs.  [facility] dr rx'ed testosterone.   ?Past Medical History:  ?Diagnosis Date  ? Anxiety   ? mild, xanax sparingly  ? Arthritis   ? generalized  ? GERD (gastroesophageal reflux disease)   ? hx of  ? History of chicken pox   ? HLD (hyperlipidemia)   ? previous PCP stopped tricor 2/2 elevated LFTs 07/2010  ? 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 Obesity   ? Transaminitis   ? hx mild, thought fibrate related - w/u ~2008 neg HbsAg, Hep C, ANA, nl iron/ferritin, ceruloplasmin, a1-antitrypsin, normal [place]  ? ? ?Past Surgical History:  ?Procedure Laterality Date  ? COLONOSCOPY  10/2015  ? one polyp, diverticulosis, rpt 5 yrs ([facility])  ? GANGLION CYST EXCISION Right 2008  ?  wrist  ? ? ?Social History  ? ?Socioeconomic History  ? Marital status: Married  ?  Spouse name: Not on file  ? Number of children: 2  ? Years of education: college  ? Highest education level: Not on file  ?Occupational History  ? Occupation: Radiation protection practitioner  ?  Employer: OTHER  ?  Comment: [employer]  ?Tobacco Use  ? Smoking status: Never  ? Smokeless tobacco: Never  ?Vaping Use  ? Vaping Use: Never used  ?Substance and Sexual Activity  ? Alcohol use: Not Currently  ?  Comment: Rare  ? Drug use: No  ? Sexual activity: Not on file  ?Other Topics Concern  ? Not on file  ?Social History Narrative  ? Caffeine: occasionally.Lives with wife [name]), 2 children (son and daughter), 1 catOccupation: Activity: no regular exerciseDiet: good water, daily fruits/vegetables, red meat rarely, fish 1x/wk, cut out fast food  ?   ? Writes with left hand and does  things with his right hand.  ?   ? Lives in a two story home   ? ?Social Determinants of Health  ? ?Financial Resource Strain: Not on file  ?Food Insecurity: Not on file  ?Transportation Needs: Not on file  ?Physical Activity: Not on file  ?Stress: Not on file  ?Social Connections: Not on file  ?Intimate Partner Violence: Not on file  ? ? ?Current Outpatient Medications on File Prior to Visit  ?Medication Sig Dispense Refill  ? ALPRAZolam (XANAX) 0.5 MG tablet Take 1 tablet (0.5 mg total) by mouth at bedtime as needed for anxiety. 15 tablet 0  ? Ascorbic Acid (VITAMIN C) 500 MG CAPS Take 1 tablet by mouth daily as needed.    ? Cholecalciferol (VITAMIN D) 50 MCG (2000 UT) CAPS Take 1 capsule by mouth daily at 6 (six) AM.    ? IBUPROFEN PO Take 1 tablet by mouth every 8 (eight) hours as needed.    ? levothyroxine (SYNTHROID) 50 MCG tablet Take 1 tablet (50 mcg total) by mouth daily. 90 tablet 3  ? Multiple Vitamin (MULTIVITAMIN PO) Take 1 tablet by mouth daily at 6 (six) AM.    ? naproxen sodium (ALEVE) 220 MG tablet Take 220 mg by mouth daily as needed.    ? Omega-3 Fatty Acids (  FISH OIL PO) Take 1 capsule by mouth daily at 6 (six) AM.    ? predniSONE (DELTASONE) 5 MG tablet Take 1 tablet (5 mg total) by mouth daily. 90 tablet 3  ? ?No current facility-administered medications on file prior to visit.  ? ? ?No Known Allergies ? ?Family History  ?Problem Relation Age of Onset  ? Hypertension Mother   ? Hyperlipidemia Father   ?     TG  ? Diverticulitis Father   ? Diabetes Maternal Grandmother   ? Colon polyps Maternal Grandfather   ? Coronary artery disease Paternal Grandfather 19  ?     unhealthy  ? Breast cancer Other 80  ?     great grandmother, maternal  ? Stroke Neg Hx   ? Colon cancer Neg Hx   ? Esophageal cancer Neg Hx   ? Rectal cancer Neg Hx   ? Stomach cancer Neg Hx   ? Adrenal disorder Neg Hx   ? ? ?BP (!) 146/80   Pulse 92   Ht '5\' 11"'$  (1.803 m)   Wt 207 lb 6.4 oz (94.1 kg)   SpO2 97%   BMI 28.93 kg/m?   ? ? ?Review of Systems ? ?   ?Objective:  ? Physical Exam ?VITAL SIGNS:  See vs page ?GENERAL: no distress ? ? ?MRI: macroadenoma ?   ?Assessment & Plan:  ?pituitary macroadenoma, uncertain etiology and prognosis.   ?Pit insuff: check [place] today.  Please continue the same prednisone and synthroid.   ? ?Patient Instructions  ?Blood tests are requested for you today.  We'll let you know about the results.   ?Please see your eye specialist, as scheduled.  Please bring the MRI report.   ?Please see a neurosurgery specialist.  you will receive a phone call, about a day and time for an appointment.   ? ? ?

## 2021-09-19 ENCOUNTER — Encounter: Payer: Self-pay | Admitting: Endocrinology

## 2021-09-22 ENCOUNTER — Encounter: Payer: Self-pay | Admitting: Medical

## 2021-09-25 ENCOUNTER — Encounter: Payer: Self-pay | Admitting: Medical

## 2021-09-25 LAB — INSULIN-LIKE GROWTH FACTOR
IGF-I, LC/MS: 163 ng/mL (ref 50–317)
Z-Score (Male): 0.3 SD (ref ?–2.0)

## 2021-09-25 LAB — GROWTH HORMONE: Growth Hormone: 0.1 ng/mL (ref <=7.1)

## 2021-09-29 ENCOUNTER — Encounter: Payer: Self-pay | Admitting: Medical

## 2021-10-09 ENCOUNTER — Other Ambulatory Visit: Payer: Self-pay | Admitting: Otolaryngology

## 2021-10-09 ENCOUNTER — Other Ambulatory Visit: Payer: Self-pay | Admitting: Neurological Surgery

## 2021-10-10 ENCOUNTER — Telehealth: Payer: Self-pay | Admitting: Medical

## 2021-10-10 NOTE — Telephone Encounter (Signed)
Opened to review 

## 2021-10-10 NOTE — Telephone Encounter (Signed)
This pt has pituitary adenoma and will get surgery to remove. I am aware Dr. Loanne Drilling will be leaving and have concerns of who will follow up from endocrinlogist office. Pt states he has tried to find that out and he is not aware of follow up appointment. I placed referral asking this question and have not got response. Would you mind calling office and getting some feedback from office manager. ? ?Pt had asked me about switching endocrinologist since he was aware Dr Loanne Drilling is leaving. I did not think good idea to try to establish care with new endodrinologist in middle of surgery process etc. Will you let me know what office manager states? ? ? ? ?Dr. Loanne Drilling last A/P ? ?pituitary macroadenoma, uncertain etiology and prognosis.   ?Pit insuff: check [place] today.  Please continue the same prednisone and synthroid.   ? ? ?

## 2021-10-13 ENCOUNTER — Encounter: Payer: Self-pay | Admitting: Endocrinology

## 2021-10-20 NOTE — Telephone Encounter (Signed)
Please drop new referral ?

## 2021-10-24 ENCOUNTER — Other Ambulatory Visit: Payer: Self-pay | Admitting: Otolaryngology

## 2021-10-24 NOTE — Pre-Procedure Instructions (Signed)
Surgical Instructions ? ? ? Your procedure is scheduled on Wednesday April 24. ? Report to [facility] Main Entrance "A" at 5:30 A.M., then check in with the Admitting office. ? Call this number if you have problems the morning of surgery: ? [phone] ? ? If you have any questions prior to your surgery date call [phone]: Open Monday-Friday 8am-4pm ? ? ? Remember: ? Do not eat after midnight the night before your surgery ? ?You may drink clear liquids until 4:30AM the morning of your surgery.   ?Clear liquids allowed are: Water, Non-Citrus Juices (without pulp), Carbonated Beverages, Clear Tea, Black Coffee ONLY (NO MILK, CREAM OR POWDERED CREAMER of any kind), and Gatorade ?  ? Take these medicines the morning of surgery with A SIP OF WATER:  ? ?levothyroxine (SYNTHROID) ?predniSONE (DELTASONE) ?ALPRAZolam Duanne Moron)  if needed ?fluticasone [facility])  if needed ? ?As of today, STOP taking any Aspirin (unless otherwise instructed by your surgeon) Aleve, Naproxen, Ibuprofen, Motrin, Advil, Goody's, BC's, all herbal medications, fish oil, and all vitamins. ? ?         ?Do not wear jewelry or makeup ?Do not wear lotions, powders, perfumes/colognes, or deodorant. ?Do not shave 48 hours prior to surgery.  Men may shave face and neck. ?Do not bring valuables to the hospital. ?Do not wear nail polish, gel polish, artificial nails, or any other type of covering on natural nails (fingers and toes) ?If you have artificial nails or gel coating that need to be removed by a nail salon, please have this removed prior to surgery. Artificial nails or gel coating may interfere with anesthesia's ability to adequately monitor your vital signs. ? ?[facility] is not responsible for any belongings or valuables. .  ? ?Do NOT Smoke (Tobacco/Vaping)  24 hours prior to your procedure ? ?If you use a CPAP at night, you may bring your mask for your overnight stay. ?  ?Contacts, glasses, hearing aids, dentures or partials may not be worn  into surgery, please bring cases for these belongings ?  ?For patients admitted to the hospital, discharge time will be determined by your treatment team. ?  ?Patients discharged the day of surgery will not be allowed to drive home, and someone needs to stay with them for 24 hours. ? ? ?SURGICAL WAITING ROOM VISITATION ?Patients having surgery or a procedure in a hospital may have two support people. ?Children under the age of 19 must have an adult with them who is not the patient. ?They may stay in the waiting area during the procedure and may switch out with other visitors. If the patient needs to stay at the hospital during part of their recovery, the visitor guidelines for inpatient rooms apply. ? ?Please refer to the [facility] website for the visitor guidelines for Inpatients (after your surgery is over and you are in a regular room).  ? ? ? ? ? ?Special instructions:   ? ?Oral Hygiene is also important to reduce your risk of infection.  Remember - BRUSH YOUR TEETH THE MORNING OF SURGERY WITH YOUR REGULAR TOOTHPASTE ? ? ?[facility]- Preparing For Surgery ? ?Before surgery, you can play an important role. Because skin is not sterile, your skin needs to be as free of germs as possible. You can reduce the number of germs on your skin by washing with CHG (chlorahexidine gluconate) Soap before surgery.  CHG is an antiseptic cleaner which kills germs and bonds with the skin to continue killing germs even after washing.   ? ? ?  Please do not use if you have an allergy to CHG or antibacterial soaps. If your skin becomes reddened/irritated stop using the CHG.  ?Do not shave (including legs and underarms) for at least 48 hours prior to first CHG shower. It is OK to shave your face. ? ?Please follow these instructions carefully. ?  ? ? Shower the NIGHT BEFORE SURGERY and the MORNING OF SURGERY with CHG Soap.  ? If you chose to wash your hair, wash your hair first as usual with your normal shampoo. After you shampoo,  rinse your hair and body thoroughly to remove the shampoo.  Then [employer] and genitals (private parts) with your normal soap and rinse thoroughly to remove soap. ? ?After that Use CHG Soap as you would any other liquid soap. You can apply CHG directly to the skin and wash gently with a scrungie or a clean washcloth.  ? ?Apply the CHG Soap to your body ONLY FROM THE NECK DOWN.  Do not use on open wounds or open sores. Avoid contact with your eyes, ears, mouth and genitals (private parts). Wash Face and genitals (private parts)  with your normal soap.  ? ?Wash thoroughly, paying special attention to the area where your surgery will be performed. ? ?Thoroughly rinse your body with warm water from the neck down. ? ?DO NOT shower/wash with your normal soap after using and rinsing off the CHG Soap. ? ?Pat yourself dry with a CLEAN TOWEL. ? ?Wear CLEAN PAJAMAS to bed the night before surgery ? ?Place CLEAN SHEETS on your bed the night before your surgery ? ?DO NOT SLEEP WITH PETS. ? ? ?Day of Surgery: ? ?Take a shower with CHG soap. ?Wear Clean/Comfortable clothing the morning of surgery ?Do not apply any deodorants/lotions.   ?Remember to brush your teeth WITH YOUR REGULAR TOOTHPASTE. ? ? ? ?If you received a COVID test during your pre-op visit  it is requested that you wear a mask when out in public, stay away from anyone that may not be feeling well and notify your surgeon if you develop symptoms. If you have been in contact with anyone that has tested positive in the last 10 days please notify you surgeon. ? ?  ?Please read over the following fact sheets that you were given.  ? ?

## 2021-10-27 ENCOUNTER — Encounter (HOSPITAL_COMMUNITY)
Admission: RE | Admit: 2021-10-27 | Discharge: 2021-10-27 | Disposition: A | Discharge status: Home / Self Care | Payer: BC Managed Care – PPO | Source: Ambulatory Visit | Attending: Neurological Surgery | Admitting: Neurological Surgery

## 2021-10-27 ENCOUNTER — Other Ambulatory Visit: Payer: Self-pay

## 2021-10-27 ENCOUNTER — Encounter (HOSPITAL_COMMUNITY): Payer: Self-pay

## 2021-10-27 VITALS — BP 126/97 | HR 68 | Temp 97.6°F | Resp 17 | Ht 71.0 in | Wt 211.1 lb

## 2021-10-27 DIAGNOSIS — Z01812 Encounter for preprocedural laboratory examination: Secondary | ICD-10-CM | POA: Diagnosis present

## 2021-10-27 DIAGNOSIS — Z01818 Encounter for other preprocedural examination: Secondary | ICD-10-CM

## 2021-10-27 DIAGNOSIS — E119 Type 2 diabetes mellitus without complications: Secondary | ICD-10-CM | POA: Diagnosis not present

## 2021-10-27 HISTORY — DX: Nontoxic goiter, unspecified: E04.9

## 2021-10-27 HISTORY — DX: Disorder of thyroid, unspecified: E07.9

## 2021-10-27 LAB — TYPE AND SCREEN
ABO/RH(D): O POS
Antibody Screen: NEGATIVE

## 2021-10-27 LAB — CBC
HCT: 43.3 % (ref 39.0–52.0)
Hemoglobin: 14.6 g/dL (ref 13.0–17.0)
MCH: 28.9 pg (ref 26.0–34.0)
MCHC: 33.7 g/dL (ref 30.0–36.0)
MCV: 85.7 fL (ref 80.0–100.0)
Platelets: 217 10*3/uL (ref 150–400)
RBC: 5.05 MIL/uL (ref 4.22–5.81)
RDW: 13.5 % (ref 11.5–15.5)
WBC: 6.1 10*3/uL (ref 4.0–10.5)
nRBC: 0 % (ref 0.0–0.2)

## 2021-10-27 LAB — BASIC METABOLIC PANEL
Anion gap: 7 (ref 5–15)
BUN: 14 mg/dL (ref 6–20)
CO2: 26 mmol/L (ref 22–32)
Calcium: 9.2 mg/dL (ref 8.9–10.3)
Chloride: 105 mmol/L (ref 98–111)
Creatinine, Ser: 0.91 mg/dL (ref 0.61–1.24)
GFR, Estimated: >60 mL/min (ref >60)
Glucose, Bld: 84 mg/dL (ref 70–99)
Potassium: 3.9 mmol/L (ref 3.5–5.1)
Sodium: 138 mmol/L (ref 135–145)

## 2021-10-27 LAB — SURGICAL PCR SCREEN

## 2021-10-27 NOTE — Pre-Procedure Instructions (Addendum)
Surgical Instructions ? ? ? Your procedure is scheduled on Wednesday April 26 ? Report to [facility] Main Entrance "A" at 5:30 A.M., then check in with the Admitting office. ? Call this number if you have problems the morning of surgery: ? [phone] ? ? If you have any questions prior to your surgery date call [phone]: Open Monday-Friday 8am-4pm ? ? ? Remember: ? Do not eat after midnight the night before your surgery ? ?You may drink clear liquids until 4:30AM the morning of your surgery.   ?Clear liquids allowed are: Water, Non-Citrus Juices (without pulp), Carbonated Beverages, Clear Tea, Black Coffee ONLY (NO MILK, CREAM OR POWDERED CREAMER of any kind), and Gatorade ?  ? Take these medicines the morning of surgery with A SIP OF WATER:  ? ?levothyroxine (SYNTHROID) ?predniSONE (DELTASONE) ?ALPRAZolam Duanne Moron)  if needed ?fluticasone [facility])  if needed ? ?As of today, STOP taking any Aspirin (unless otherwise instructed by your surgeon) Aleve, Naproxen, Ibuprofen, Motrin, Advil, Goody's, BC's, all herbal medications, fish oil, and all vitamins. ? ?         ?Do not wear jewelry or makeup ?Do not wear lotions, powders, perfumes/colognes, or deodorant. ?Do not shave 48 hours prior to surgery.  Men may shave face and neck. ?Do not bring valuables to the hospital. ? ?[facility] is not responsible for any belongings or valuables. .  ? ?Do NOT Smoke (Tobacco/Vaping)  24 hours prior to your procedure ? ?If you use a CPAP at night, you may bring your mask for your overnight stay. ?  ?Contacts, glasses, hearing aids, dentures or partials may not be worn into surgery, please bring cases for these belongings ?  ?For patients admitted to the hospital, discharge time will be determined by your treatment team. ?  ?Patients discharged the day of surgery will not be allowed to drive home, and someone needs to stay with them for 24 hours. ? ? ?SURGICAL WAITING ROOM VISITATION ?Patients having surgery or a procedure in a  hospital may have two support people. ?Children under the age of 14 must have an adult with them who is not the patient. ?They may stay in the waiting area during the procedure and may switch out with other visitors. If the patient needs to stay at the hospital during part of their recovery, the visitor guidelines for inpatient rooms apply. ? ?Please refer to the [facility] website for the visitor guidelines for Inpatients (after your surgery is over and you are in a regular room).  ? ? ? ? ? ?Special instructions:   ? ?Oral Hygiene is also important to reduce your risk of infection.  Remember - BRUSH YOUR TEETH THE MORNING OF SURGERY WITH YOUR REGULAR TOOTHPASTE ? ? ?[facility]- Preparing For Surgery ? ?Before surgery, you can play an important role. Because skin is not sterile, your skin needs to be as free of germs as possible. You can reduce the number of germs on your skin by washing with CHG (chlorahexidine gluconate) Soap before surgery.  CHG is an antiseptic cleaner which kills germs and bonds with the skin to continue killing germs even after washing.   ? ? ?Please do not use if you have an allergy to CHG or antibacterial soaps. If your skin becomes reddened/irritated stop using the CHG.  ?Do not shave (including legs and underarms) for at least 48 hours prior to first CHG shower. It is OK to shave your face. ? ?Please follow these instructions carefully. ?  ? ?  Shower the NIGHT BEFORE SURGERY and the MORNING OF SURGERY with CHG Soap.  ? If you chose to wash your hair, wash your hair first as usual with your normal shampoo. After you shampoo, rinse your hair and body thoroughly to remove the shampoo.  Then [employer] and genitals (private parts) with your normal soap and rinse thoroughly to remove soap. ? ?After that Use CHG Soap as you would any other liquid soap. You can apply CHG directly to the skin and wash gently with a scrungie or a clean washcloth.  ? ?Apply the CHG Soap to your body ONLY FROM THE  NECK DOWN.  Do not use on open wounds or open sores. Avoid contact with your eyes, ears, mouth and genitals (private parts). Wash Face and genitals (private parts)  with your normal soap.  ? ?Wash thoroughly, paying special attention to the area where your surgery will be performed. ? ?Thoroughly rinse your body with warm water from the neck down. ? ?DO NOT shower/wash with your normal soap after using and rinsing off the CHG Soap. ? ?Pat yourself dry with a CLEAN TOWEL. ? ?Wear CLEAN PAJAMAS to bed the night before surgery ? ?Place CLEAN SHEETS on your bed the night before your surgery ? ?DO NOT SLEEP WITH PETS. ? ? ?Day of Surgery: ? ?Take a shower with CHG soap. ?Wear Clean/Comfortable clothing the morning of surgery ?Do not apply any deodorants/lotions.   ?Remember to brush your teeth WITH YOUR REGULAR TOOTHPASTE. ? ? ? ?If you received a COVID test during your pre-op visit  it is requested that you wear a mask when out in public, stay away from anyone that may not be feeling well and notify your surgeon if you develop symptoms. If you have been in contact with anyone that has tested positive in the last 10 days please notify you surgeon. ? ?  ?Please read over the following fact sheets that you were given.  ? ?

## 2021-10-27 NOTE — Progress Notes (Signed)
PCP: Mackie Pai, PA-C ?Cardiologist: denies ? ?EKG: n/a ?CXR: n/a ?ECHO: denies ?Stress Test: denies ?Cardiac Cath: denies ? ?ERAS: clears ? ?COVID: n/a ? ?Patient denies shortness of breath, fever, cough, and chest pain at PAT appointment. ? ?Patient verbalized understanding of instructions provided today at the PAT appointment.  Patient asked to review instructions at home and day of surgery.  ? ?

## 2021-11-05 ENCOUNTER — Encounter (HOSPITAL_COMMUNITY): Payer: Self-pay | Admitting: Neurological Surgery

## 2021-11-05 ENCOUNTER — Other Ambulatory Visit: Payer: Self-pay

## 2021-11-05 ENCOUNTER — Inpatient Hospital Stay (HOSPITAL_COMMUNITY)
Admission: RE | Admit: 2021-11-05 | Discharge: 2021-11-06 | DRG: 614 | Disposition: A | Discharge status: Home / Self Care | Payer: BC Managed Care – PPO | Attending: Neurological Surgery | Admitting: Neurological Surgery

## 2021-11-05 ENCOUNTER — Inpatient Hospital Stay (HOSPITAL_COMMUNITY): Payer: BC Managed Care – PPO | Admitting: Anesthesiology

## 2021-11-05 ENCOUNTER — Encounter (HOSPITAL_COMMUNITY)
Admission: RE | Disposition: A | Discharge status: Home / Self Care | Payer: Self-pay | Source: Home / Self Care | Attending: Neurological Surgery

## 2021-11-05 ENCOUNTER — Inpatient Hospital Stay (HOSPITAL_COMMUNITY): Payer: BC Managed Care – PPO

## 2021-11-05 DIAGNOSIS — E785 Hyperlipidemia, unspecified: Secondary | ICD-10-CM | POA: Diagnosis present

## 2021-11-05 DIAGNOSIS — E669 Obesity, unspecified: Secondary | ICD-10-CM | POA: Diagnosis present

## 2021-11-05 DIAGNOSIS — Z6829 Body mass index (BMI) 29.0-29.9, adult: Secondary | ICD-10-CM | POA: Diagnosis not present

## 2021-11-05 DIAGNOSIS — Z83438 Family history of other disorder of lipoprotein metabolism and other lipidemia: Secondary | ICD-10-CM

## 2021-11-05 DIAGNOSIS — J342 Deviated nasal septum: Secondary | ICD-10-CM | POA: Diagnosis present

## 2021-11-05 DIAGNOSIS — Z8249 Family history of ischemic heart disease and other diseases of the circulatory system: Secondary | ICD-10-CM

## 2021-11-05 DIAGNOSIS — Z8371 Family history of colonic polyps: Secondary | ICD-10-CM | POA: Diagnosis not present

## 2021-11-05 DIAGNOSIS — Z833 Family history of diabetes mellitus: Secondary | ICD-10-CM

## 2021-11-05 DIAGNOSIS — E271 Primary adrenocortical insufficiency: Secondary | ICD-10-CM | POA: Diagnosis present

## 2021-11-05 DIAGNOSIS — K219 Gastro-esophageal reflux disease without esophagitis: Secondary | ICD-10-CM | POA: Diagnosis present

## 2021-11-05 DIAGNOSIS — F419 Anxiety disorder, unspecified: Secondary | ICD-10-CM | POA: Diagnosis present

## 2021-11-05 DIAGNOSIS — D352 Benign neoplasm of pituitary gland: Principal | ICD-10-CM | POA: Diagnosis present

## 2021-11-05 DIAGNOSIS — Z01818 Encounter for other preprocedural examination: Secondary | ICD-10-CM

## 2021-11-05 DIAGNOSIS — E230 Hypopituitarism: Secondary | ICD-10-CM | POA: Diagnosis present

## 2021-11-05 DIAGNOSIS — Z803 Family history of malignant neoplasm of breast: Secondary | ICD-10-CM

## 2021-11-05 HISTORY — PX: CRANIOTOMY: SHX93

## 2021-11-05 HISTORY — PX: TRANSPHENOIDAL APPROACH EXPOSURE: SHX6311

## 2021-11-05 HISTORY — PX: PLACEMENT OF LUMBAR DRAIN: SHX6028

## 2021-11-05 LAB — CBC
HCT: 39.9 % (ref 39.0–52.0)
Hemoglobin: 13.6 g/dL (ref 13.0–17.0)
MCH: 28.6 pg (ref 26.0–34.0)
MCHC: 34.1 g/dL (ref 30.0–36.0)
MCV: 84 fL (ref 80.0–100.0)
Platelets: 237 10*3/uL (ref 150–400)
RBC: 4.75 MIL/uL (ref 4.22–5.81)
RDW: 13.7 % (ref 11.5–15.5)
WBC: 10.3 10*3/uL (ref 4.0–10.5)
nRBC: 0 % (ref 0.0–0.2)

## 2021-11-05 LAB — SURGICAL PCR SCREEN
MRSA, PCR: NEGATIVE
Staphylococcus aureus: NEGATIVE

## 2021-11-05 LAB — CREATININE, SERUM
Creatinine, Ser: 0.83 mg/dL (ref 0.61–1.24)
GFR, Estimated: >60 mL/min (ref >60)

## 2021-11-05 LAB — ABO/RH: ABO/RH(D): O POS

## 2021-11-05 IMAGING — RF DG LUMBAR SPINE 1V
1 series · 1 of 1 positions shown · non-contrast
Comparison: None.

CLINICAL DATA: Placement of lumbar drain

EXAM:
LUMBAR SPINE - 1 VIEW

[Series 1: run · 1 of 1 slices shown]
[im 1/1]
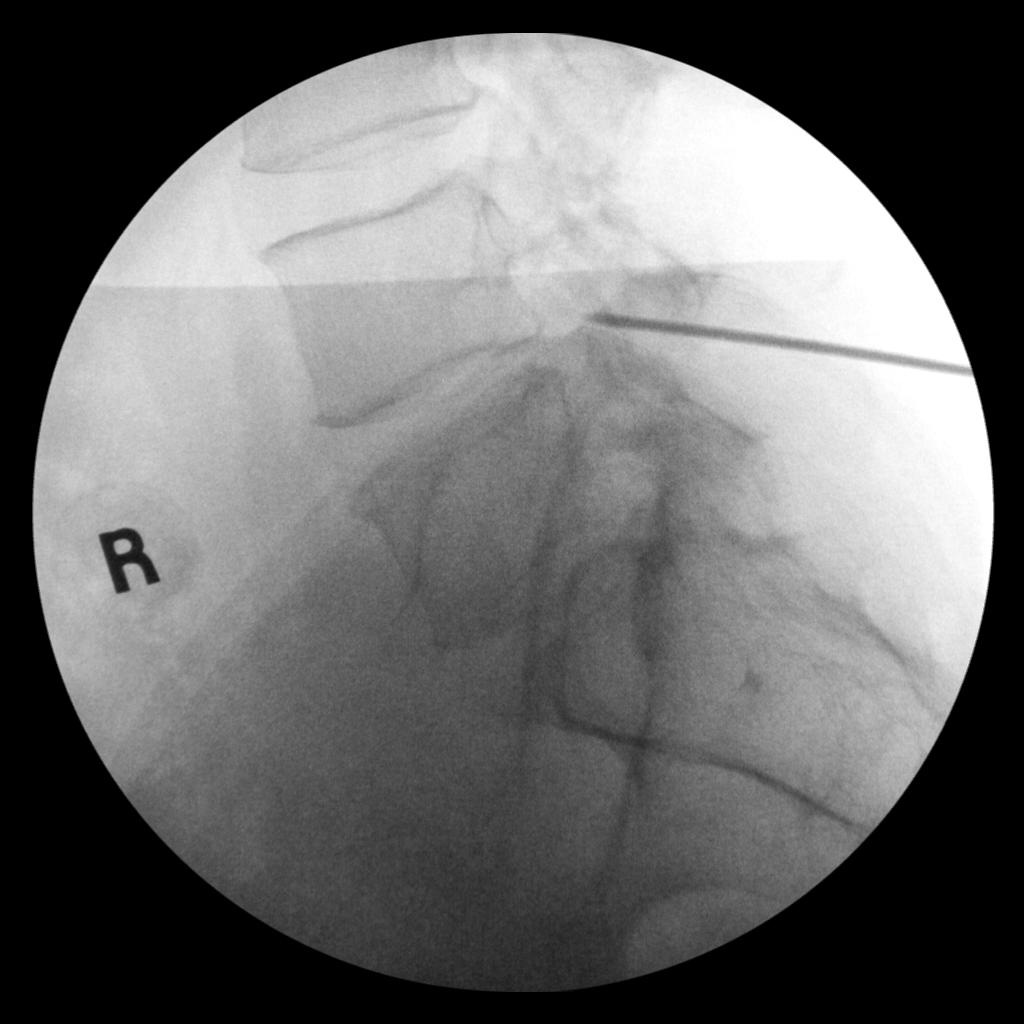

[1 of 1 positions shown; findings below may reference images not displayed]

FLUOROSCOPY:
Exposure Index (as provided by the fluoroscopic device): 4.85 mGy
Kerma
FINDINGS: Fluoroscopic images were obtained intraoperatively and submitted for
post operative interpretation. Localization hardware tip projects at
the level of the L4-L5 disc space, 1 images were obtained with 8
seconds of fluoroscopy time and 4.85 mGy. Please see the performing
provider's procedural report for further detail.
IMPRESSION: As above

## 2021-11-05 SURGERY — CRANIOTOMY HYPOPHYSECTOMY TRANSNASAL APPROACH
Anesthesia: General | Site: Nose

## 2021-11-05 MED ORDER — ACETAMINOPHEN 500 MG PO TABS: 1000.0000 mg | ORAL_TABLET | Freq: Once | ORAL | Status: DC | PRN

## 2021-11-05 MED ORDER — AMISULPRIDE (ANTIEMETIC) 5 MG/2ML IV SOLN
INTRAVENOUS | Status: AC
  Filled 2021-11-05: qty 4

## 2021-11-05 MED ORDER — ROCURONIUM BROMIDE 10 MG/ML (PF) SYRINGE
PREFILLED_SYRINGE | INTRAVENOUS | Status: AC
  Filled 2021-11-05: qty 10

## 2021-11-05 MED ORDER — SUGAMMADEX SODIUM 200 MG/2ML IV SOLN
INTRAVENOUS | Status: DC | PRN
  Administered 2021-11-05: 200 mg via INTRAVENOUS

## 2021-11-05 MED ORDER — PROPOFOL 10 MG/ML IV BOLUS
INTRAVENOUS | Status: AC
  Filled 2021-11-05: qty 20

## 2021-11-05 MED ORDER — FENTANYL CITRATE (PF) 100 MCG/2ML IJ SOLN
INTRAMUSCULAR | Status: AC
  Filled 2021-11-05: qty 2

## 2021-11-05 MED ORDER — CHLORHEXIDINE GLUCONATE CLOTH 2 % EX PADS
6.0000 | MEDICATED_PAD | Freq: Every day | CUTANEOUS | Status: DC
  Administered 2021-11-05: 6 via TOPICAL

## 2021-11-05 MED ORDER — ROCURONIUM BROMIDE 10 MG/ML (PF) SYRINGE
PREFILLED_SYRINGE | INTRAVENOUS | Status: DC | PRN
  Administered 2021-11-05: 20 mg via INTRAVENOUS
  Administered 2021-11-05: 80 mg via INTRAVENOUS
  Administered 2021-11-05: 30 mg via INTRAVENOUS
  Administered 2021-11-05: 50 mg via INTRAVENOUS

## 2021-11-05 MED ORDER — FENTANYL CITRATE (PF) 250 MCG/5ML IJ SOLN
INTRAMUSCULAR | Status: AC
  Filled 2021-11-05: qty 5

## 2021-11-05 MED ORDER — OXYCODONE HCL 5 MG/5ML PO SOLN: 5.0000 mg | Freq: Once | ORAL | Status: AC | PRN

## 2021-11-05 MED ORDER — THROMBIN 5000 UNITS EX SOLR
CUTANEOUS | Status: AC
  Filled 2021-11-05: qty 5000

## 2021-11-05 MED ORDER — CEFAZOLIN SODIUM-DEXTROSE 2-4 GM/100ML-% IV SOLN
2.0000 g | Freq: Three times a day (TID) | INTRAVENOUS | Status: AC
  Administered 2021-11-05 – 2021-11-06 (×2): 2 g via INTRAVENOUS
  Filled 2021-11-05 (×2): qty 100

## 2021-11-05 MED ORDER — POLYETHYLENE GLYCOL 3350 17 G PO PACK: 17.0000 g | PACK | Freq: Every day | ORAL | Status: DC | PRN

## 2021-11-05 MED ORDER — ONDANSETRON HCL 4 MG/2ML IJ SOLN
INTRAMUSCULAR | Status: AC
  Filled 2021-11-05: qty 2

## 2021-11-05 MED ORDER — CHLORHEXIDINE GLUCONATE CLOTH 2 % EX PADS: 6.0000 | MEDICATED_PAD | Freq: Once | CUTANEOUS | Status: DC

## 2021-11-05 MED ORDER — LIDOCAINE 2% (20 MG/ML) 5 ML SYRINGE
INTRAMUSCULAR | Status: DC | PRN
  Administered 2021-11-05: 60 mg via INTRAVENOUS

## 2021-11-05 MED ORDER — PHENYLEPHRINE 80 MCG/ML (10ML) SYRINGE FOR IV PUSH (FOR BLOOD PRESSURE SUPPORT)
PREFILLED_SYRINGE | INTRAVENOUS | Status: AC
  Filled 2021-11-05: qty 10

## 2021-11-05 MED ORDER — PREDNISONE 5 MG PO TABS
5.0000 mg | ORAL_TABLET | Freq: Every day | ORAL | Status: DC
  Administered 2021-11-05 – 2021-11-06 (×2): 5 mg via ORAL
  Filled 2021-11-05 (×3): qty 1

## 2021-11-05 MED ORDER — ACETAMINOPHEN 10 MG/ML IV SOLN
1000.0000 mg | Freq: Once | INTRAVENOUS | Status: DC | PRN
  Administered 2021-11-05: 1000 mg via INTRAVENOUS

## 2021-11-05 MED ORDER — ONDANSETRON HCL 4 MG/2ML IJ SOLN
INTRAMUSCULAR | Status: DC | PRN
Start: 2021-11-05 — End: 2021-11-05
  Administered 2021-11-05: 4 mg via INTRAVENOUS

## 2021-11-05 MED ORDER — CEPHALEXIN 500 MG PO CAPS: 500.0000 mg | ORAL_CAPSULE | Freq: Three times a day (TID) | ORAL | 0 refills | Status: AC

## 2021-11-05 MED ORDER — HEPARIN SODIUM (PORCINE) 5000 UNIT/ML IJ SOLN: 5000.0000 [IU] | Freq: Three times a day (TID) | INTRAMUSCULAR | Status: DC

## 2021-11-05 MED ORDER — MIDAZOLAM HCL 2 MG/2ML IJ SOLN
INTRAMUSCULAR | Status: AC
  Filled 2021-11-05: qty 2

## 2021-11-05 MED ORDER — VITAMIN D3 25 MCG (1000 UNIT) PO TABS
5000.0000 [IU] | ORAL_TABLET | Freq: Every day | ORAL | Status: DC
  Administered 2021-11-06: 5000 [IU] via ORAL
  Filled 2021-11-05 (×2): qty 5

## 2021-11-05 MED ORDER — PHENYLEPHRINE HCL-NACL 20-0.9 MG/250ML-% IV SOLN
INTRAVENOUS | Status: DC | PRN
  Administered 2021-11-05: 10 ug/min via INTRAVENOUS

## 2021-11-05 MED ORDER — EPHEDRINE SULFATE-NACL 50-0.9 MG/10ML-% IV SOSY
PREFILLED_SYRINGE | INTRAVENOUS | Status: DC | PRN
  Administered 2021-11-05 (×2): 5 mg via INTRAVENOUS

## 2021-11-05 MED ORDER — AMISULPRIDE (ANTIEMETIC) 5 MG/2ML IV SOLN
10.0000 mg | Freq: Once | INTRAVENOUS | Status: AC
  Administered 2021-11-05: 10 mg via INTRAVENOUS

## 2021-11-05 MED ORDER — FENTANYL CITRATE (PF) 100 MCG/2ML IJ SOLN
25.0000 ug | INTRAMUSCULAR | Status: DC | PRN
  Administered 2021-11-05 (×4): 50 ug via INTRAVENOUS

## 2021-11-05 MED ORDER — SODIUM CHLORIDE 0.9 % IV SOLN
0.2000 ug/kg/min | INTRAVENOUS | Status: DC
  Administered 2021-11-05: .1 ug/kg/min via INTRAVENOUS
  Filled 2021-11-05 (×2): qty 2000

## 2021-11-05 MED ORDER — LABETALOL HCL 5 MG/ML IV SOLN
10.0000 mg | INTRAVENOUS | Status: DC | PRN
  Filled 2021-11-05: qty 8

## 2021-11-05 MED ORDER — PROMETHAZINE HCL 12.5 MG PO TABS
12.5000 mg | ORAL_TABLET | ORAL | Status: DC | PRN
  Filled 2021-11-05: qty 2

## 2021-11-05 MED ORDER — FLUTICASONE PROPIONATE 50 MCG/ACT NA SUSP
2.0000 | Freq: Every day | NASAL | Status: DC | PRN
  Filled 2021-11-05: qty 16

## 2021-11-05 MED ORDER — LACTATED RINGERS IV SOLN: INTRAVENOUS | Status: DC

## 2021-11-05 MED ORDER — MUPIROCIN CALCIUM 2 % EX CREA
TOPICAL_CREAM | CUTANEOUS | Status: AC
  Filled 2021-11-05: qty 15

## 2021-11-05 MED ORDER — PREGNENOLONE MICRONIZED 50 MG PO TABS
75.0000 mg | ORAL_TABLET | Freq: Every morning | ORAL | Status: DC
Start: 2021-11-06 — End: 2021-11-05

## 2021-11-05 MED ORDER — OXYCODONE HCL 5 MG PO TABS
ORAL_TABLET | ORAL | Status: AC
  Filled 2021-11-05: qty 1

## 2021-11-05 MED ORDER — OXYMETAZOLINE HCL 0.05 % NA SOLN
NASAL | Status: DC | PRN
  Administered 2021-11-05: 2 via NASAL

## 2021-11-05 MED ORDER — MIDAZOLAM HCL 5 MG/5ML IJ SOLN
INTRAMUSCULAR | Status: DC | PRN
  Administered 2021-11-05: 2 mg via INTRAVENOUS

## 2021-11-05 MED ORDER — CHLORHEXIDINE GLUCONATE 0.12 % MT SOLN
15.0000 mL | Freq: Once | OROMUCOSAL | Status: AC
  Administered 2021-11-05: 15 mL via OROMUCOSAL
  Filled 2021-11-05: qty 15

## 2021-11-05 MED ORDER — LIDOCAINE-EPINEPHRINE 1 %-1:100000 IJ SOLN
INTRAMUSCULAR | Status: AC
  Filled 2021-11-05: qty 1

## 2021-11-05 MED ORDER — FENTANYL CITRATE (PF) 250 MCG/5ML IJ SOLN
INTRAMUSCULAR | Status: DC | PRN
  Administered 2021-11-05: 25 ug via INTRAVENOUS
  Administered 2021-11-05: 125 ug via INTRAVENOUS

## 2021-11-05 MED ORDER — HYDROCODONE-ACETAMINOPHEN 5-325 MG PO TABS
1.0000 | ORAL_TABLET | ORAL | Status: DC | PRN
  Administered 2021-11-05 – 2021-11-06 (×2): 1 via ORAL
  Filled 2021-11-05 (×2): qty 1

## 2021-11-05 MED ORDER — ACETAMINOPHEN 650 MG RE SUPP
650.0000 mg | RECTAL | Status: DC | PRN
Start: 2021-11-05 — End: 2021-11-06

## 2021-11-05 MED ORDER — CEFAZOLIN SODIUM-DEXTROSE 2-4 GM/100ML-% IV SOLN
2.0000 g | INTRAVENOUS | Status: AC
Start: 2021-11-05 — End: 2021-11-05
  Administered 2021-11-05: 2 g via INTRAVENOUS
  Filled 2021-11-05: qty 100

## 2021-11-05 MED ORDER — OXYMETAZOLINE HCL 0.05 % NA SOLN
NASAL | Status: AC
  Filled 2021-11-05: qty 30

## 2021-11-05 MED ORDER — 0.9 % SODIUM CHLORIDE (POUR BTL) OPTIME
TOPICAL | Status: DC | PRN
  Administered 2021-11-05: 1000 mL

## 2021-11-05 MED ORDER — OXYMETAZOLINE HCL 0.05 % NA SOLN
NASAL | Status: DC | PRN
  Administered 2021-11-05: 1

## 2021-11-05 MED ORDER — THROMBIN 5000 UNITS EX SOLR
OROMUCOSAL | Status: DC | PRN
  Administered 2021-11-05: 5 mL via TOPICAL

## 2021-11-05 MED ORDER — PHENYLEPHRINE HCL-NACL 20-0.9 MG/250ML-% IV SOLN
INTRAVENOUS | Status: AC
  Filled 2021-11-05: qty 250

## 2021-11-05 MED ORDER — CEPHALEXIN 500 MG PO CAPS
500.0000 mg | ORAL_CAPSULE | Freq: Three times a day (TID) | ORAL | Status: DC
  Administered 2021-11-06 (×2): 500 mg via ORAL
  Filled 2021-11-05 (×3): qty 1

## 2021-11-05 MED ORDER — SODIUM CHLORIDE 0.9 % IR SOLN
Status: DC | PRN
  Administered 2021-11-05: 1000 mL

## 2021-11-05 MED ORDER — ALPRAZOLAM 0.5 MG PO TABS: 0.5000 mg | ORAL_TABLET | Freq: Every evening | ORAL | Status: DC | PRN

## 2021-11-05 MED ORDER — DOCUSATE SODIUM 100 MG PO CAPS
100.0000 mg | ORAL_CAPSULE | Freq: Two times a day (BID) | ORAL | Status: DC
  Administered 2021-11-06: 100 mg via ORAL
  Filled 2021-11-05: qty 1

## 2021-11-05 MED ORDER — SUCCINYLCHOLINE CHLORIDE 200 MG/10ML IV SOSY
PREFILLED_SYRINGE | INTRAVENOUS | Status: AC
  Filled 2021-11-05: qty 10

## 2021-11-05 MED ORDER — ACETAMINOPHEN 10 MG/ML IV SOLN
INTRAVENOUS | Status: AC
Start: 2021-11-05 — End: 2021-11-05
  Filled 2021-11-05: qty 100

## 2021-11-05 MED ORDER — TRIAMCINOLONE ACETONIDE 40 MG/ML IJ SUSP
INTRAMUSCULAR | Status: AC
  Filled 2021-11-05: qty 5

## 2021-11-05 MED ORDER — ACETAMINOPHEN 160 MG/5ML PO SOLN: 1000.0000 mg | Freq: Once | ORAL | Status: DC | PRN

## 2021-11-05 MED ORDER — LIDOCAINE HCL URETHRAL/MUCOSAL 2 % EX GEL
1.0000 "application " | Freq: Once | CUTANEOUS | Status: AC
  Administered 2021-11-05: 1 via URETHRAL
  Filled 2021-11-05: qty 6

## 2021-11-05 MED ORDER — ORAL CARE MOUTH RINSE: 15.0000 mL | Freq: Once | OROMUCOSAL | Status: AC

## 2021-11-05 MED ORDER — SODIUM CHLORIDE 0.9 % IV SOLN: INTRAVENOUS | Status: DC | PRN

## 2021-11-05 MED ORDER — ACETAMINOPHEN 325 MG PO TABS
650.0000 mg | ORAL_TABLET | ORAL | Status: DC | PRN
  Administered 2021-11-06: 650 mg via ORAL
  Filled 2021-11-05: qty 2

## 2021-11-05 MED ORDER — INDOCYANINE GREEN 25 MG IV SOLR
INTRAVENOUS | Status: AC
  Filled 2021-11-05: qty 10

## 2021-11-05 MED ORDER — LIDOCAINE-EPINEPHRINE 1 %-1:100000 IJ SOLN
INTRAMUSCULAR | Status: DC | PRN
  Administered 2021-11-05: 9 mL

## 2021-11-05 MED ORDER — LEVOTHYROXINE SODIUM 50 MCG PO TABS
50.0000 ug | ORAL_TABLET | Freq: Every day | ORAL | Status: DC
  Administered 2021-11-06: 50 ug via ORAL
  Filled 2021-11-05: qty 1

## 2021-11-05 MED ORDER — PROPOFOL 10 MG/ML IV BOLUS
INTRAVENOUS | Status: DC | PRN
  Administered 2021-11-05: 120 mg via INTRAVENOUS
  Administered 2021-11-05 (×2): 30 mg via INTRAVENOUS

## 2021-11-05 MED ORDER — DEXAMETHASONE SODIUM PHOSPHATE 10 MG/ML IJ SOLN
INTRAMUSCULAR | Status: DC | PRN
  Administered 2021-11-05: 10 mg via INTRAVENOUS

## 2021-11-05 MED ORDER — NITROGLYCERIN IN D5W 200-5 MCG/ML-% IV SOLN
INTRAVENOUS | Status: AC
  Filled 2021-11-05: qty 250

## 2021-11-05 MED ORDER — ONDANSETRON HCL 4 MG PO TABS
4.0000 mg | ORAL_TABLET | ORAL | Status: DC | PRN
  Filled 2021-11-05: qty 1

## 2021-11-05 MED ORDER — ONDANSETRON HCL 4 MG/2ML IJ SOLN
4.0000 mg | INTRAMUSCULAR | Status: DC | PRN
  Filled 2021-11-05: qty 2

## 2021-11-05 MED ORDER — EPHEDRINE 5 MG/ML INJ
INTRAVENOUS | Status: AC
  Filled 2021-11-05: qty 5

## 2021-11-05 MED ORDER — HYDROMORPHONE HCL 1 MG/ML IJ SOLN: 0.5000 mg | INTRAMUSCULAR | Status: DC | PRN

## 2021-11-05 MED ORDER — OXYCODONE HCL 5 MG PO TABS
5.0000 mg | ORAL_TABLET | Freq: Once | ORAL | Status: AC | PRN
  Administered 2021-11-05: 5 mg via ORAL

## 2021-11-05 MED ORDER — HEMOSTATIC AGENTS (NO CHARGE) OPTIME
TOPICAL | Status: DC | PRN
Start: 2021-11-05 — End: 2021-11-05
  Administered 2021-11-05 (×2): 1 via TOPICAL

## 2021-11-05 MED ORDER — DEXAMETHASONE SODIUM PHOSPHATE 10 MG/ML IJ SOLN
INTRAMUSCULAR | Status: AC
  Filled 2021-11-05: qty 1

## 2021-11-05 MED ORDER — LIDOCAINE 2% (20 MG/ML) 5 ML SYRINGE
INTRAMUSCULAR | Status: AC
  Filled 2021-11-05: qty 5

## 2021-11-05 SURGICAL SUPPLY — 122 items
ATTRACTOMAT 16X20 MAGNETIC DRP (DRAPES) ×3 IMPLANT
BAG COUNTER SPONGE SURGICOUNT (BAG) ×9 IMPLANT
BAND RUBBER #18 3X1/16 STRL (MISCELLANEOUS) ×4 IMPLANT
BENZOIN TINCTURE PRP APPL 2/3 (GAUZE/BANDAGES/DRESSINGS) ×3 IMPLANT
BLADE CLIPPER SURG (BLADE) IMPLANT
BLADE RAD40 ROTATE 4M 4 5PK (BLADE) IMPLANT
BLADE ROTATE TRICUT 4X13 M4 (BLADE) ×3 IMPLANT
BLADE SURG 10 STRL SS (BLADE) ×3 IMPLANT
BLADE SURG 11 STRL SS (BLADE) ×6 IMPLANT
BLADE SURG 15 STRL LF DISP TIS (BLADE) ×4 IMPLANT
BLADE SURG 15 STRL SS (BLADE) ×2
BUR DIAMOND 13X5 70D (BURR) IMPLANT
BUR DIAMOND CURV 15X5 15D (BURR) IMPLANT
BUR TAPER CHOANAL ATRESIA 30K (BURR) ×2 IMPLANT
CABLE BIPOLOR RESECTION CORD (MISCELLANEOUS) ×3 IMPLANT
CANISTER SUCT 3000ML PPV (MISCELLANEOUS) ×9 IMPLANT
CATH LUMBAR HERMETIC 14G (CATHETERS) ×2 IMPLANT
CATHETER LUMBAR HERMETIC 14G (CATHETERS) ×3
COAGULATOR SUCT 8FR VV (MISCELLANEOUS) ×3 IMPLANT
COAGULATOR SUCT SWTCH 10FR 6 (ELECTROSURGICAL) ×2 IMPLANT
COVER BACK TABLE 60X90IN (DRAPES) IMPLANT
COVER MAYO STAND STRL (DRAPES) ×3 IMPLANT
DERMABOND ADVANCED (GAUZE/BANDAGES/DRESSINGS) ×1
DERMABOND ADVANCED .7 DNX12 (GAUZE/BANDAGES/DRESSINGS) ×2 IMPLANT
DRAIN SUBARACHNOID (WOUND CARE) ×3 IMPLANT
DRAPE C-ARM 42X72 X-RAY (DRAPES) ×4 IMPLANT
DRAPE HALF SHEET 40X57 (DRAPES) ×9 IMPLANT
DRAPE INCISE IOBAN 66X45 STRL (DRAPES) ×3 IMPLANT
DRAPE LAPAROTOMY 100X72X124 (DRAPES) ×3 IMPLANT
DRAPE MICROSCOPE LEICA (MISCELLANEOUS) IMPLANT
DRAPE SURG 17X23 STRL (DRAPES) ×12 IMPLANT
DRESSING NASAL POPE 10X1.5X2.5 (GAUZE/BANDAGES/DRESSINGS) IMPLANT
DRSG NASAL POPE 10X1.5X2.5 (GAUZE/BANDAGES/DRESSINGS)
DURAPREP 26ML APPLICATOR (WOUND CARE) ×6 IMPLANT
ELECT COATED BLADE 2.86 ST (ELECTRODE) IMPLANT
ELECT NDL TIP 2.8 STRL (NEEDLE) ×1 IMPLANT
ELECT NEEDLE TIP 2.8 STRL (NEEDLE) ×3 IMPLANT
ELECT REM PT RETURN 9FT ADLT (ELECTROSURGICAL) ×3
ELECTRODE REM PT RTRN 9FT ADLT (ELECTROSURGICAL) ×3 IMPLANT
GAUZE 4X4 16PLY ~~LOC~~+RFID DBL (SPONGE) ×3 IMPLANT
GAUZE PACKING FOLDED 2  STR (GAUZE/BANDAGES/DRESSINGS) ×1
GAUZE PACKING FOLDED 2 STR (GAUZE/BANDAGES/DRESSINGS) ×2 IMPLANT
GAUZE SPONGE 2X2 8PLY STRL LF (GAUZE/BANDAGES/DRESSINGS) ×2 IMPLANT
GAUZE SPONGE 4X4 12PLY STRL (GAUZE/BANDAGES/DRESSINGS) ×3 IMPLANT
GLOVE BIOGEL M 7.0 STRL (GLOVE) ×6 IMPLANT
GLOVE BIOGEL PI IND STRL 7.5 (GLOVE) ×6 IMPLANT
GLOVE BIOGEL PI INDICATOR 7.5 (GLOVE) ×3
GLOVE ECLIPSE 7.5 STRL STRAW (GLOVE) ×3 IMPLANT
GLOVE EXAM NITRILE LRG STRL (GLOVE) IMPLANT
GLOVE EXAM NITRILE XL STR (GLOVE) IMPLANT
GLOVE EXAM NITRILE XS STR PU (GLOVE) IMPLANT
GLOVE SURG LTX SZ7.5 (GLOVE) ×3 IMPLANT
GOWN STRL REUS W/ TWL LRG LVL3 (GOWN DISPOSABLE) ×6 IMPLANT
GOWN STRL REUS W/ TWL XL LVL3 (GOWN DISPOSABLE) IMPLANT
GOWN STRL REUS W/TWL 2XL LVL3 (GOWN DISPOSABLE) ×3 IMPLANT
GOWN STRL REUS W/TWL LRG LVL3 (GOWN DISPOSABLE) ×3
GOWN STRL REUS W/TWL XL LVL3 (GOWN DISPOSABLE)
GRAFT DURAGEN MATRIX 1WX1L (Tissue) ×2 IMPLANT
HEMOSTAT POWDER KIT SURGIFOAM (HEMOSTASIS) ×3 IMPLANT
HEMOSTAT SURGICEL 2X14 (HEMOSTASIS) IMPLANT
KIT BASIN OR (CUSTOM PROCEDURE TRAY) ×5 IMPLANT
KIT DRAIN CSF ACCUDRAIN (MISCELLANEOUS) ×3 IMPLANT
KIT TURNOVER KIT B (KITS) ×5 IMPLANT
KNIFE ARACHNOID DISP AM-21-S (BLADE) IMPLANT
NDL HYPO 18GX1.5 BLUNT FILL (NEEDLE) ×1 IMPLANT
NDL HYPO 25GX1X1/2 BEV (NEEDLE) ×1 IMPLANT
NDL HYPO 25X1 1.5 SAFETY (NEEDLE) ×2 IMPLANT
NDL SPNL 22GX3.5 QUINCKE BK (NEEDLE) ×1 IMPLANT
NDL SPNL 25GX3.5 QUINCKE BL (NEEDLE) ×1 IMPLANT
NEEDLE HYPO 18GX1.5 BLUNT FILL (NEEDLE) ×3 IMPLANT
NEEDLE HYPO 25GX1X1/2 BEV (NEEDLE) ×3 IMPLANT
NEEDLE HYPO 25X1 1.5 SAFETY (NEEDLE) ×6 IMPLANT
NEEDLE SPNL 22GX3.5 QUINCKE BK (NEEDLE) ×3 IMPLANT
NEEDLE SPNL 25GX3.5 QUINCKE BL (NEEDLE) ×3 IMPLANT
NS IRRIG 1000ML POUR BTL (IV SOLUTION) ×5 IMPLANT
PACK LAMINECTOMY NEURO (CUSTOM PROCEDURE TRAY) ×3 IMPLANT
PAD ARMBOARD 7.5X6 YLW CONV (MISCELLANEOUS) ×18 IMPLANT
PATTIES SURGICAL .25X.25 (GAUZE/BANDAGES/DRESSINGS) IMPLANT
PATTIES SURGICAL .5 X.5 (GAUZE/BANDAGES/DRESSINGS) ×3 IMPLANT
PATTIES SURGICAL .5 X3 (DISPOSABLE) ×1 IMPLANT
PENCIL BUTTON HOLSTER BLD 10FT (ELECTRODE) ×3 IMPLANT
PROBE FOR NEUROSURGERY (MISCELLANEOUS) IMPLANT
SEALANT ADHERUS EXTEND TIP (MISCELLANEOUS) ×4 IMPLANT
SHEATH ENDOSCRUB 0 DEG (SHEATH) ×3 IMPLANT
SHEATH ENDOSCRUB 30 DEG (SHEATH) IMPLANT
SHEATH ENDOSCRUB 45 DEG (SHEATH) IMPLANT
SPECIMEN JAR SMALL (MISCELLANEOUS) IMPLANT
SPLINT NASAL DOYLE BI-VL (GAUZE/BANDAGES/DRESSINGS) ×1 IMPLANT
SPLINT NASAL POSISEP X2 .8X2.3 (GAUZE/BANDAGES/DRESSINGS) ×2 IMPLANT
SPONGE GAUZE 2X2 STER 10/PKG (GAUZE/BANDAGES/DRESSINGS)
SPONGE NEURO XRAY DETECT 1X3 (DISPOSABLE) ×3 IMPLANT
SPONGE SURGIFOAM ABS GEL SZ50 (HEMOSTASIS) IMPLANT
SPONGE T-LAP 4X18 ~~LOC~~+RFID (SPONGE) ×3 IMPLANT
STAPLER SKIN PROX WIDE 3.9 (STAPLE) ×3 IMPLANT
STRIP CLOSURE SKIN 1/2X4 (GAUZE/BANDAGES/DRESSINGS) ×3 IMPLANT
SUT 5.0 PDS RB-1 (SUTURE)
SUT BONE WAX W31G (SUTURE) ×3 IMPLANT
SUT ETHILON 3 0 FSL (SUTURE) IMPLANT
SUT ETHILON 3 0 PS 1 (SUTURE) ×1 IMPLANT
SUT ETHILON 6 0 P 1 (SUTURE) IMPLANT
SUT MNCRL AB 3-0 PS2 27 (SUTURE) ×1 IMPLANT
SUT PDS AB 4-0 RB1 27 (SUTURE) IMPLANT
SUT PDS PLUS AB 5-0 RB-1 (SUTURE) IMPLANT
SUT PLAIN 4 0 ~~LOC~~ 1 (SUTURE) ×1 IMPLANT
SUT SILK 2 0 PERMA HAND 18 BK (SUTURE) ×3 IMPLANT
SUT SILK 2 0 TIES 10X30 (SUTURE) ×3 IMPLANT
SUT VIC AB 2-0 CP2 18 (SUTURE) ×1 IMPLANT
SUT VIC AB 4-0 P-3 18X BRD (SUTURE) IMPLANT
SUT VIC AB 4-0 P3 18 (SUTURE)
SYR CONTROL 10ML LL (SYRINGE) ×9 IMPLANT
TOWEL GREEN STERILE (TOWEL DISPOSABLE) ×6 IMPLANT
TOWEL GREEN STERILE FF (TOWEL DISPOSABLE) ×9 IMPLANT
TRACKER ENT INSTRUMENT (MISCELLANEOUS) ×3 IMPLANT
TRACKER ENT PATIENT (MISCELLANEOUS) ×3 IMPLANT
TRAP SPECIMEN MUCUS 40CC (MISCELLANEOUS) IMPLANT
TRAY ENT MC OR (CUSTOM PROCEDURE TRAY) ×4 IMPLANT
TRAY FOLEY MTR SLVR 16FR STAT (SET/KITS/TRAYS/PACK) ×3 IMPLANT
TUBE CONNECTING 12X1/4 (SUCTIONS) ×3 IMPLANT
TUBE SALEM SUMP 16 FR W/ARV (TUBING) ×3 IMPLANT
TUBING EXTENTION W/L.L. (IV SETS) ×3 IMPLANT
TUBING STRAIGHTSHOT EPS 5PK (TUBING) ×3 IMPLANT
WATER STERILE IRR 1000ML POUR (IV SOLUTION) ×6 IMPLANT

## 2021-11-05 NOTE — Op Note (Addendum)
PATIENT: [name] ? ?DAY OF SURGERY: 11/05/21 ?  ?PRE-OPERATIVE DIAGNOSIS:  Pituitary adenoma ?  ?POST-OPERATIVE DIAGNOSIS:  Pituitary adenoma ?  ?PROCEDURE:  Endonasal endoscopic transphenoidal resection of pituitary tumor ?  ?SURGEON:  Surgeon(s) and Role: ?   Judith Part, MD - Co-surgeon ?   Jerrell Belfast, MD - Co-surgeon ?  ?ANESTHESIA: ETGA ?  ?BRIEF HISTORY: This is a 51 year old man who presented with symptoms of addison's and decreased right sided visual acuity, workup showed a pituitary macroadenoma. This was discussed with the patient as well as risks, benefits, and alternatives and wished to proceed with surgical treatment. ?  ?OPERATIVE DETAIL:  ? ?The patient was anesthetized by the anesthesia team, time out was performed with two patient identifiers, and the lumbar drain was placed. The patient was placed in lateral decubitus, C-arm was brought in, and a Tuohy needle was used to access the L4-5 interspace, CSF was obtained, and the catheter was passed without resistance, needle removed, and secured. ? ?Dr. Wilburn Cornelia performed the nasal portion of the approach and is dictated separately. ? ?For the neurosurgical portion of the case, Dr. Wilburn Cornelia and I used a combination of visual anatomic landmarks and frameless stereotaxy to confirm orientation and anatomy. The dura of the sella was then opened sharply with a #15 blade in a cruciate fashion. Tumor was immediately encountered and samples were taken and sent to pathology for analysis. With Dr. Victorio Palm assistance, the tumor was then resected in a deliberate fashion with bimanual technique using a combination of ring curettes, suction, and penfield #4. It was dissected laterally, then posteriorly. With a good plane developed, ringed curettes were used to remove the inferior portion of the tumor to allow the superior portion to descend into the field. The superior portion was then resected with extra attention paid to the  superolateral corners as the diaphragma began to descend. At the end of resection, there was pulsatile diaphragma in the field. It did not fall down into the field as far as I would expect, I had anesthesia inject some sterile saline into the lumbar drain to help with descent but there was no further descent. I could not see any convincing tumor removal in the lateral recesses and opted to stop further attempts due to the lack of CSF leakage thus far and apparent good resection and decompression of the chiasm. Hemostasis was confirmed and Adheris ([employer]) was used to fill the sella. Dr. Wilburn Cornelia then took over to complete the skull base reconstruction portion of the procedure.  ? ?EBL:  261m ?  ?DRAINS: none ?  ?SPECIMENS: Pituitary tumor ?  ?TJudith Part MD ?11/05/21 ?7:32 AM ? ?

## 2021-11-05 NOTE — Progress Notes (Signed)
? ?  ENT Progress Note: ?Procedure(s): ?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 ?PLACEMENT OF LUMBAR DRAIN ?POSSIBLE SEPTOPLASTY ?TRANSPHENOIDAL APPROACH EXPOSURE ? ? ?Subjective: ?Stable preOP, no clinical change ? ?Objective: ?Vital signs in last 24 hours: ?Temp:  [98.5 ?F (36.9 ?C)] 98.5 ?F (36.9 ?C) (04/26 5053) ?Pulse Rate:  [69] 69 (04/26 0615) ?Resp:  [17] 17 (04/26 0615) ?BP: (124)/(86) 124/86 (04/26 0615) ?SpO2:  [98 %] 98 % (04/26 0615) ?Weight:  [95.8 kg] 95.8 kg (04/26 0615) ?Weight change:  ?  ? ?Intake/Output from previous day: ?No intake/output data recorded. ?Intake/Output this shift: ?No intake/output data recorded. ? ?Labs: ?No results for input(s): WBC, HGB, HCT, PLT in the last 72 hours. ?No results for input(s): NA, K, CL, CO2, GLUCOSE, BUN, CALCIUM in the last 72 hours. ? ?Invalid input(s): CREATININR ? ?Studies/Results: ?No results found. ? ? ?PHYSICAL EXAM: ?Deviated nasal septum ? ? ?Assessment/Plan: ?Adm for Endoscopic transphenoidal pituitary resection and septoplasty with Dr. Zada Finders. ? ? ? ?Erik Cole ?11/05/2021, 7:35 AM ?  ?

## 2021-11-05 NOTE — Anesthesia Preprocedure Evaluation (Signed)
Anesthesia Evaluation  ?Patient identified by MRN, date of birth, ID band ?Patient awake ? ? ? ?Reviewed: ?Allergy & Precautions, NPO status , Patient's Chart, lab work & pertinent test results ? ?History of Anesthesia Complications ?Negative for: history of anesthetic complications ? ?Airway ?Mallampati: III ? ?TM Distance: >3 FB ?Neck ROM: Full ? ? ? Dental ? ?(+) Teeth Intact, Dental Advisory Given ?  ?Pulmonary ?neg pulmonary ROS,  ?  ?breath sounds clear to auscultation ? ? ? ? ? ? Cardiovascular ?negative cardio ROS ? ? ?Rhythm:Regular  ? ?  ?Neuro/Psych ?PSYCHIATRIC DISORDERS Anxiety Pituitary Adenoma ?  ? GI/Hepatic ?Neg liver ROS, GERD  ,  ?Endo/Other  ?Hypothyroidism  ? Renal/GU ?negative Renal ROS  ? ?  ?Musculoskeletal ? ?(+) Arthritis ,  ? Abdominal ?  ?Peds ? Hematology ?negative hematology ROS ?(+)   ?Anesthesia Other Findings ? ? Reproductive/Obstetrics ? ?  ? ? ? ? ? ? ? ? ? ? ? ? ? ?  ?  ? ? ? ? ? ? ? ? ?Anesthesia Physical ?Anesthesia Plan ? ?ASA: 2 ? ?Anesthesia Plan: General  ? ?Post-op Pain Management: Ofirmev IV (intra-op)*  ? ?Induction: Intravenous ? ?PONV Risk Score and Plan: 2 and Ondansetron and Dexamethasone ? ?Airway Management Planned: Oral ETT ? ?Additional Equipment: Arterial line ? ?Intra-op Plan:  ? ?Post-operative Plan: Extubation in OR ? ?Informed Consent: I have reviewed the patients History and Physical, chart, labs and discussed the procedure including the risks, benefits and alternatives for the proposed anesthesia with the patient or authorized representative who has indicated his/her understanding and acceptance.  ? ? ? ?Dental advisory given ? ?Plan Discussed with: CRNA ? ?Anesthesia Plan Comments: (remi infusion)  ? ? ? ? ? ? ?Anesthesia Quick Evaluation ? ?

## 2021-11-05 NOTE — Transfer of Care (Signed)
Immediate Anesthesia Transfer of Care Note ? ?Patient: Erik Cole ? ?Procedure(s) Performed: 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 (Nose) ?PLACEMENT OF LUMBAR DRAIN (Nose) ?TRANSPHENOIDAL APPROACH EXPOSURE (Nose) ? ?Patient Location: PACU ? ?Anesthesia Type:General ? ?Level of Consciousness: awake, alert  and oriented ? ?Airway & Oxygen Therapy: Patient Spontanous Breathing ? ?Post-op Assessment: Report given to RN and Post -op Vital signs reviewed and stable ? ?Post vital signs: Reviewed and stable ? ?Last Vitals:  ?Vitals Value Taken Time  ?BP 135/100 11/05/21 1151  ?Temp    ?Pulse 82 11/05/21 1151  ?Resp 15 11/05/21 1151  ?SpO2 100 % 11/05/21 1151  ?Vitals shown include unvalidated device data. ? ?Last Pain:  ?Vitals:  ? 11/05/21 0620  ?TempSrc:   ?PainSc: 0-No pain  ?   ? ?  ? ?Complications: No notable events documented. ?

## 2021-11-05 NOTE — Anesthesia Procedure Notes (Signed)
Arterial Line Insertion ?Start/End4/26/2023 7:18 AM, 11/05/2021 7:20 AM ?Performed by: Dorthea Cove, CRNA ? Patient location: Pre-op. ?Preanesthetic checklist: patient identified, IV checked, site marked, risks and benefits discussed, surgical consent, monitors and equipment checked, pre-op evaluation, timeout performed and anesthesia consent ?Lidocaine 1% used for infiltration ?Left, radial was placed ?Catheter size: 20 G ?Hand hygiene performed , maximum sterile barriers used  and Seldinger technique used ?Allen's test indicative of satisfactory collateral circulation ?Attempts: 1 ?Procedure performed without using ultrasound guided technique. ?Patient tolerated the procedure well with no immediate complications. ?Additional procedure comments: Performed by Justin Mend, SRNA under direct supervision of CRNA. ? ? ? ?

## 2021-11-05 NOTE — Op Note (Signed)
Operative Note:  ENDOSCOPIC TRANSSPHENOIDAL PITUITARY RESECTION WITH NAVIGATION ?    ? ?Patient: Erik Cole ? ?Medical record number: [id number] ? ?Date:11/05/2021 ? ?Pre-operative Indications: 1.  Pituitary Mass ?     ? ?Postoperative Indications: Same ? ?Surgical Procedure: 1. Endoscopic transsphenoidal pituitary resection with intraoperative navigation ?    ?    ? ?Anesthesia: GET ? ?Surgeon: Delsa Bern, M.D. ? ?Neurosurgeon: Emelda Brothers, MD ? ?Complications: None ? ?EBL: 100 cc ? ?Findings: Deviated nasal septum without obstruction, no evidence of sinus pathology.  Absorbable sphenoid packing placed. ? ?Note: The neurosurgical component of the operative procedure is dictated as a separate operative note. ? ? ?Brief History: ?The patient is a 51 y.o. male with a history of pituitary mass. 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 The patient was referred to Dr. Zada Finders for neurosurgical evaluation.  Patient seen by me at [facility] preoperatively with review of nasal anatomy and sinus CT scan for navigation.  Given the patient's history and findings, the above surgical procedures were recommended, risks and benefits were discussed in detail with the patient.  They understand and agree with our plan for surgery which is scheduled at [facility] under general anesthesia. ? ?Surgical Procedure: ?The patient is brought to the neurosurgical operating room on 11/05/2021 and placed in supine position on the operating table. General endotracheal anesthesia was established without difficulty. When the patient was adequately anesthetized, surgical timeout was performed with correct identification of the patient and the surgical procedure. The patient's nose was then injected with 10 cc of 1% lidocaine 1:100,000 dilution epinephrine which was injected in a submucosal fashion. The patient's nose was then packed with Afrin-soaked cottonoid pledgets were  left in place for approximately 10 minutes to allow for vasoconstriction and hemostasis.  The Xomed Fusion navigation headgear was applied and anatomic and surgical landmarks were identified and confirmed, navigation was used throughout the sinus component of the surgical procedure. ? ?With the patient prepped draped and prepared for surgery, nasal endoscopy was performed on the patient's right.  The middle turbinate was carefully lateralized to allow access to the posterior aspect of the nasal passageway.  The right sphenoid sinus ostium was identified.  The inferior aspect of the superior turbinate was then resected with through-cutting forceps and a microdebrider.  The right sphenoid sinus ostium was enlarged in a superior and lateral direction using the microdebrider and through-cutting forceps to create a widely patent ostium.  Nasal endoscopy on the patient's left-hand side was then undertaken.  The left middle turbinate was lateralized and the posterior nasal cavity was visualized with identification of the left sphenoid sinus ostium using navigation.  The inferior aspect of the superior turbinate was resected and the sinus ostium was enlarged in the lateral and superior direction to create a wide sphenoid sinus ostium.  A posterior septectomy was then performed with a Surveyor, quantity.  Bone, cartilage and soft tissue was then resected to create a wide posterior septotomy.  The anterior face of the sphenoid sinus and sphenoid sinus septum were then resected with a combination of through-cutting forceps, osteotome and microdebrider to allow direct access to the entire posterior aspect of the sphenoid sinus and pituitary fossa.  Sphenoid sinus mucosa overlying the pituitary fossa was elevated and lateralized.  The anterior face of the pituitary fossa was demarcated using navigation. ? ?With adequate access to the pituitary fossa the neurosurgical component of the procedure was begun by Dr. Zada Finders.  This is  dictated as a separate operative report.  Resection of the pituitary tumor was undertaken using direct visualization of the 0 degree endoscope, navigation and blunt and sharp dissection. ? ?With pituitary tumor resection completed, reconstruction was undertaken.  DuraGen was placed over the pituitary fossa defect, Adheris dural glue was placede along the fossa margins and the sphenoid sinus was loosely packed with absorbable nasal packing.  There was no active bleeding and no evidence of spinal fluid leak.  The sphenoid sinus was carefully inspected, no further bleeding along the mucosal margins, sphenoidotomy sites or posterior septectomy.  The patient's nasal cavity was irrigated and suctioned. ? ?Surgical sponge count was correct. An oral gastric tube was passed and the stomach contents were aspirated. Patient was awakened from anesthetic and transferred from the operating room to the recovery room in stable condition. There were no complications and blood loss was 100 cc. ? ? ?Delsa Bern, M.D. ?[facility] ?11/05/2021  ?

## 2021-11-05 NOTE — Progress Notes (Signed)
Paged on-call MD about lumbar drain orders. Current order stated to clamp drain but also measure out put every hour. Foley catheter also removed in PACU around 2pm, despite patient needing Strict I&O post surgery. Verbal order from Neurosurgery to open lumbar drain and keep below patient while draining 5-10 ml per hour as well as to reinsert foley catheter to measure Strict I&O. Numbing jelly ordered for patient comfort. ?

## 2021-11-05 NOTE — Anesthesia Procedure Notes (Addendum)
Procedure Name: Intubation ?Date/Time: 11/05/2021 7:58 AM ?Performed by: Justin Mend, RN ?Pre-anesthesia Checklist: Patient identified, Emergency Drugs available, Suction available and Patient being monitored ?Patient Re-evaluated:Patient Re-evaluated prior to induction ?Oxygen Delivery Method: Circle System Utilized ?Preoxygenation: Pre-oxygenation with 100% oxygen ?Induction Type: IV induction ?Ventilation: Mask ventilation without difficulty ?Laryngoscope Size: Sabra Heck and 3 ?Tube type: Oral ?Number of attempts: 1 ?Airway Equipment and Method: Stylet and Oral airway ?Placement Confirmation: ETT inserted through vocal cords under direct vision, positive ETCO2 and breath sounds checked- equal and bilateral ?Secured at: 23 cm ?Tube secured with: Tape ?Dental Injury: Teeth and Oropharynx as per pre-operative assessment  ?Comments: Performed by Justin Mend, SRNA under direct supervision of MDA and CRNA ? ? ? ? ?

## 2021-11-05 NOTE — H&P (Signed)
Surgical H&P Update ? ?HPI: 51 y.o. with a history of systemic symptoms, found to have pituitary insufficiency and some right sided visual changes. Workup showed a pituitary tumor. No changes in health since they were last seen. Still having the above and wishes to proceed with surgery. ? ?PMHx:  ?Past Medical History:  ?Diagnosis Date  ? Anxiety   ? mild, xanax sparingly  ? Arthritis   ? generalized  ? GERD (gastroesophageal reflux disease)   ? hx of  ? Goiter   ? History of chicken pox   ? HLD (hyperlipidemia)   ? previous PCP stopped tricor 2/2 elevated LFTs 07/2010  ? 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 Obesity   ? Thyroid disease   ? Transaminitis   ? hx mild, thought fibrate related - w/u ~2008 neg HbsAg, Hep C, ANA, nl iron/ferritin, ceruloplasmin, a1-antitrypsin, normal [place]  ? ?FamHx:  ?Family History  ?Problem Relation Age of Onset  ? Hypertension Mother   ? Hyperlipidemia Father   ?     TG  ? Diverticulitis Father   ? Diabetes Maternal Grandmother   ? Colon polyps Maternal Grandfather   ? Coronary artery disease Paternal Grandfather 26  ?     unhealthy  ? Breast cancer Other 80  ?     great grandmother, maternal  ? Stroke Neg Hx   ? Colon cancer Neg Hx   ? Esophageal cancer Neg Hx   ? Rectal cancer Neg Hx   ? Stomach cancer Neg Hx   ? Adrenal disorder Neg Hx   ? ?SocHx:  reports that he has never smoked. He has never used smokeless tobacco. He reports that he does not currently use alcohol. He reports that he does not use drugs. ? ?Physical Exam: ?Aox3, PERRL, EOMI, FS & SS, Strength 5/5 x4 and SILTx4  ? 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 Risks, benefits, and alternatives discussed and the patient would like to continue with surgery. ? ?-OR today ?-4N post-op ? ?Judith Part, MD ?11/05/49 ?7:30 AM ? ?

## 2021-11-05 NOTE — Progress Notes (Incomplete)
Neurosurgery Service ?Post-operative progress note ? ?Assessment & Plan: ?51 y.o. man s/p EETSA, seen in PACU, awake/alert, Fcx4, gaze conjugate, vision at baseline. ? ?-transfer to 4N ICU ?-lumbar drain in place, can keep clamped, likely d/c in the morning if no issues ?-MRI brain sella protocol ?-activity and diet as tolerated ? ?Marcello Moores A Yafet Cline  ?11/05/21 ?11:29 AM ? ? ?

## 2021-11-05 NOTE — Progress Notes (Signed)
PHARMACIST - PHYSICIAN ORDER COMMUNICATION ? ?CONCERNING: P&T Medication Policy on Herbal Medications ? ?DESCRIPTION:  This patient?s order for:  Pregnenolone Micronized  has been noted. ? ?This product(s) is classified as an ?herbal? or natural product. ?Due to a lack of definitive safety studies or FDA approval, nonstandard manufacturing practices, plus the potential risk of unknown drug-drug interactions while on inpatient medications, the Pharmacy and Therapeutics Committee does not permit the use of ?herbal? or natural products of this type within [facility]. ?  ?ACTION TAKEN: ?The pharmacy department is unable to verify this order at this time and your patient has been informed of this safety policy. ?Please reevaluate patient?s clinical condition at discharge and address if the herbal or natural product(s) should be resumed at that time.  ?

## 2021-11-05 NOTE — Discharge Instructions (Signed)
Sinus/Nasal Instructions: ? ?1. Limited activity ?2. Liquid and soft diet ?3. May bathe and shower ?4. Saline nasal spray - 4 puffs/nostril every hour while awake, begin the morning after surgery ?5. Elevate Head of Bed ?6. No nose blowing/Open mouth sneeze ?7. Alternate Tylenol and ibuprofen every 6 hours as needed for pain. ? ?Call [facility] for any nasal/sinus related questions or concerns: [phone]  ?

## 2021-11-06 ENCOUNTER — Inpatient Hospital Stay (HOSPITAL_COMMUNITY): Payer: BC Managed Care – PPO

## 2021-11-06 ENCOUNTER — Encounter (HOSPITAL_COMMUNITY): Payer: Self-pay | Admitting: Neurological Surgery

## 2021-11-06 LAB — RENAL FUNCTION PANEL
Albumin: 3.6 g/dL (ref 3.5–5.0)
Anion gap: 8 (ref 5–15)
BUN: 14 mg/dL (ref 6–20)
CO2: 25 mmol/L (ref 22–32)
Calcium: 8.9 mg/dL (ref 8.9–10.3)
Chloride: 103 mmol/L (ref 98–111)
Creatinine, Ser: 0.71 mg/dL (ref 0.61–1.24)
GFR, Estimated: >60 mL/min (ref >60)
Glucose, Bld: 122 mg/dL — ABNORMAL HIGH (ref 70–99)
Phosphorus: 4 mg/dL (ref 2.5–4.6)
Potassium: 4.2 mmol/L (ref 3.5–5.1)
Sodium: 136 mmol/L (ref 135–145)

## 2021-11-06 LAB — SURGICAL PATHOLOGY

## 2021-11-06 IMAGING — MR MR HEAD WO/W CM
10 of 20 series · 24 of 48 positions shown · IV contrast (gadavist)
Comparison: Brain MRI [DATE]

CLINICAL DATA: Status post trans-sphenoidal resection

EXAM:
MRI HEAD WITHOUT AND WITH CONTRAST
TECHNIQUE: Multiplanar, multiecho pulse sequences of the brain and surrounding
structures were obtained without and with intravenous contrast.
CONTRAST:  4mL GADAVIST GADOBUTROL 1 MMOL/ML IV SOLN

[Series 2: DWI · axial · 3.0mm · 0.94mm/px · z∈[-81,+61]mm · 7 of 98 slices shown]
[im 1/98]
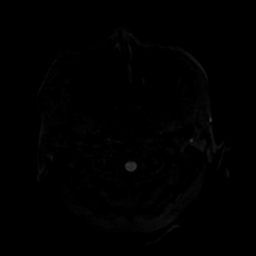
[im 17/98]
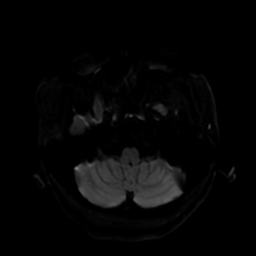
[im 33/98]
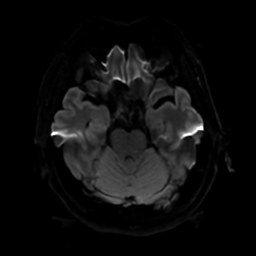
[im 49/98]
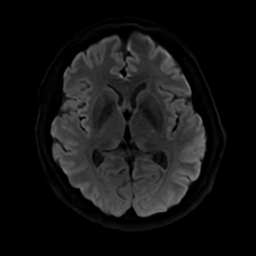
[im 65/98]
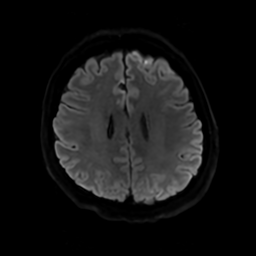
[im 81/98]
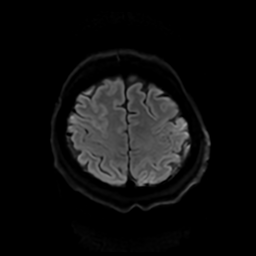
[im 98/98]
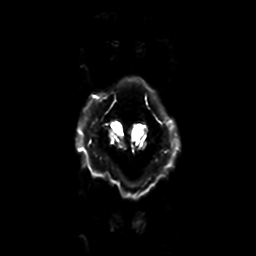

[Series 4: FLAIR · sagittal · 5.0mm · 0.47mm/px · 1 of 28 slices shown (1 of 2)]
[im 1/28]
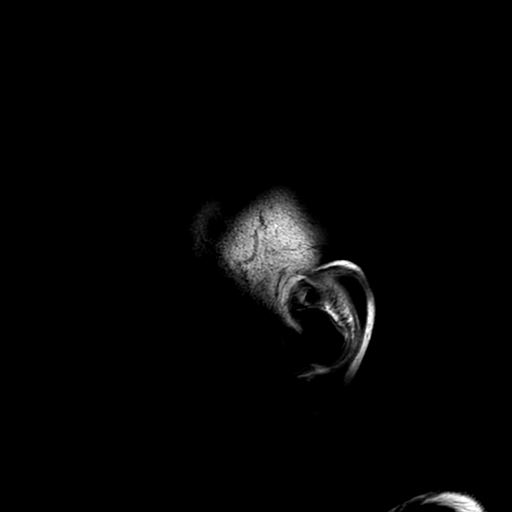

[Series 5: T2 · axial · 5.0mm · 0.23mm/px · 1 of 25 slices shown (1 of 2)]
[im 1/25]
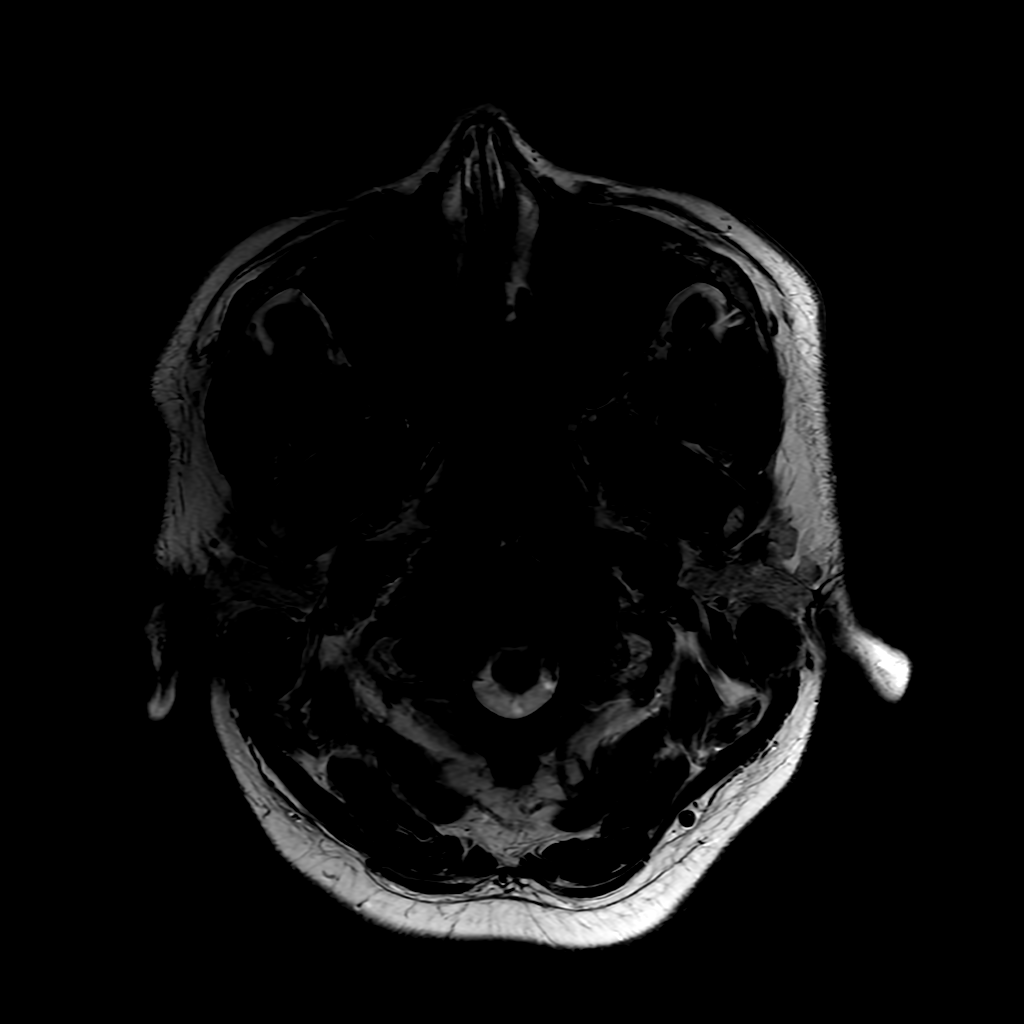

[Series 6: FLAIR · axial · 4.0mm · 0.45mm/px · z∈[-82,+61]mm · 3 of 34 slices shown (2 of 2)]
[im 1/34]
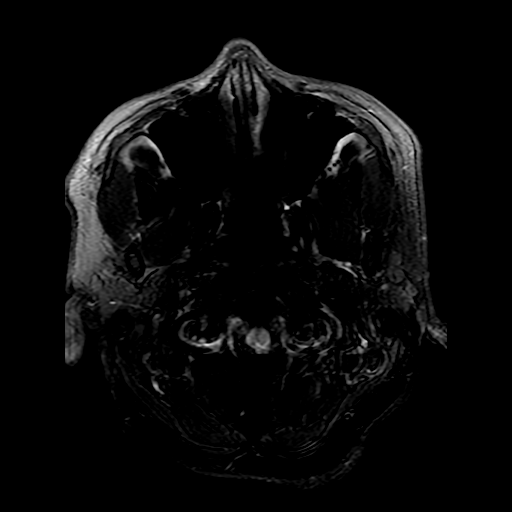
[im 17/34]
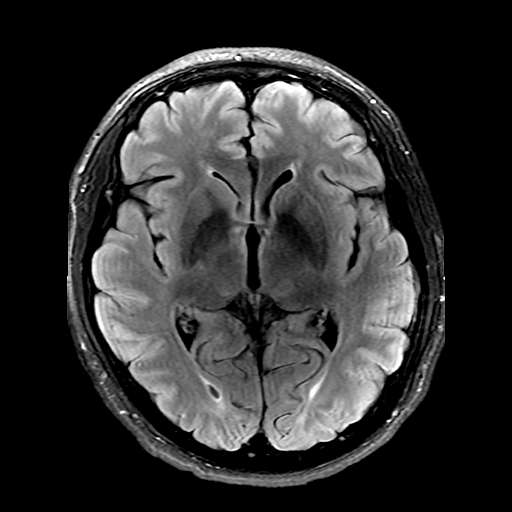
[im 34/34]
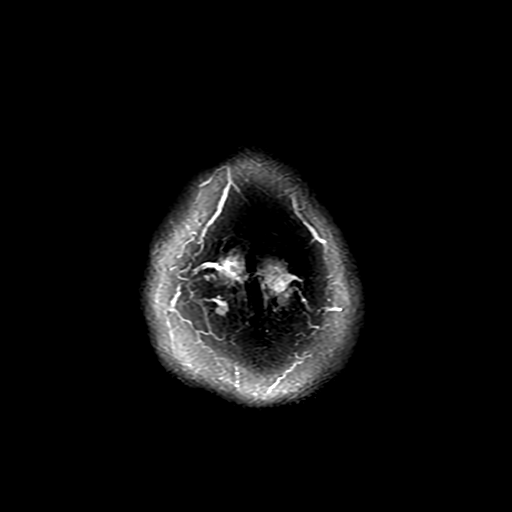

[Series 9: T1 · sagittal · 3.0mm · 0.35mm/px · 1 of 18 slices shown (1 of 2)]
[im 1/18]
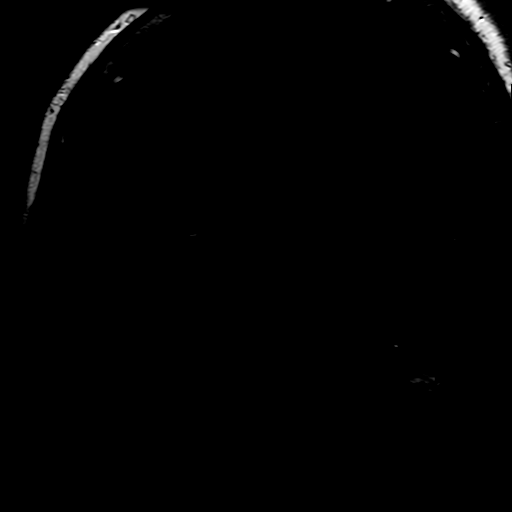

[Series 10: T1 · coronal · 3.0mm · 0.35mm/px · 2 of 19 slices shown (2 of 2)]
[im 1/19]
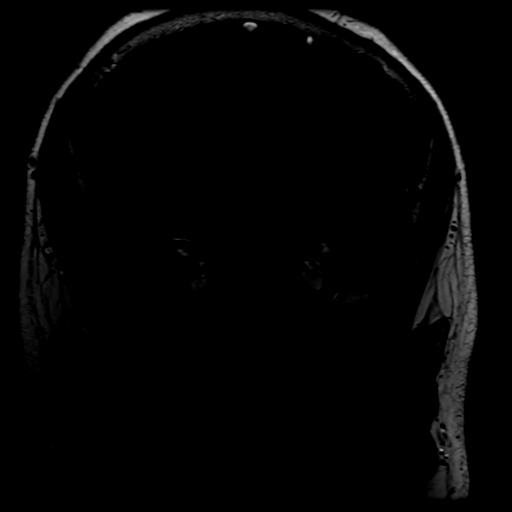
[im 19/19]
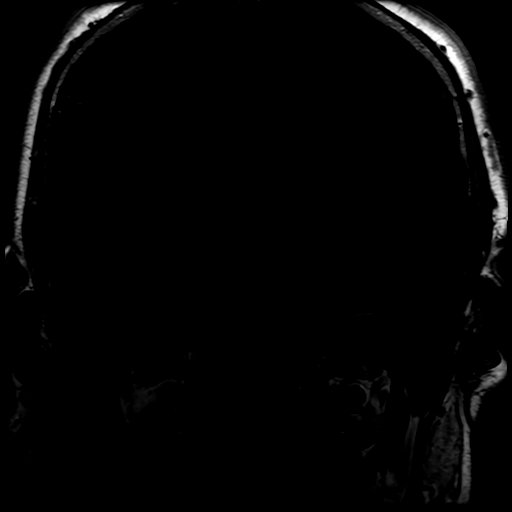

[Series 11: T2 · coronal · 3.0mm · 0.35mm/px · 2 of 19 slices shown (2 of 2)]
[im 1/19]
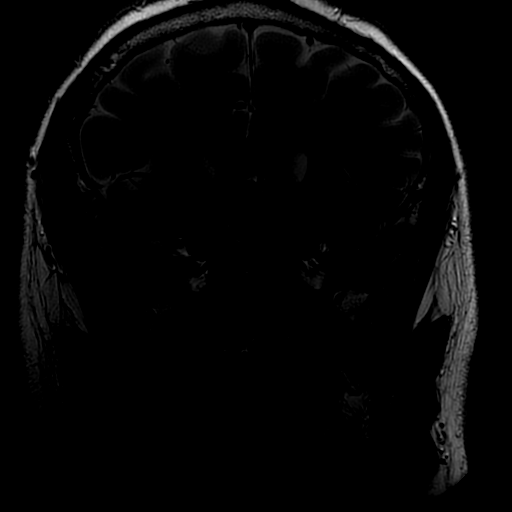
[im 19/19]
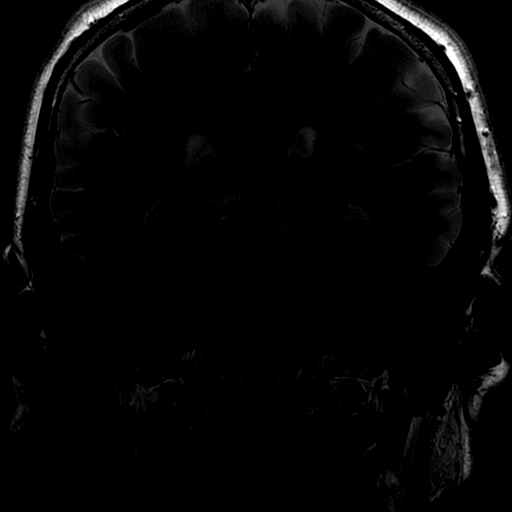

[Series 13: T1 post-contrast · sagittal · 3.0mm · 0.35mm/px · 1 of 18 slices shown (1 of 2)]
[im 1/18]
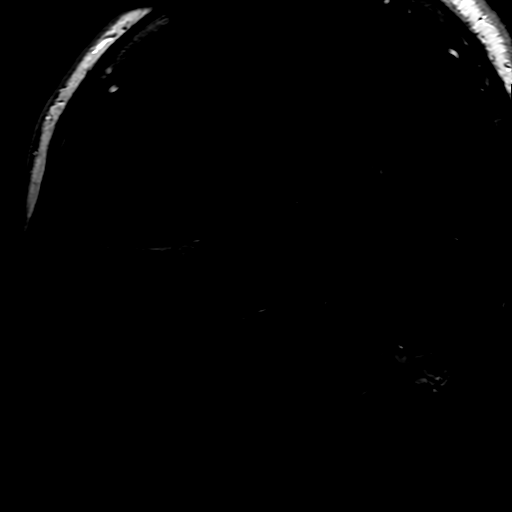

[Series 14: T1 post-contrast · coronal · 3.0mm · 0.35mm/px · 2 of 19 slices shown (2 of 2)]
[im 1/19]
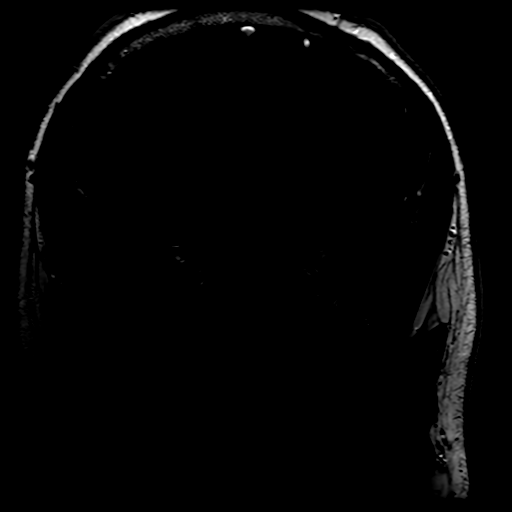
[im 19/19]
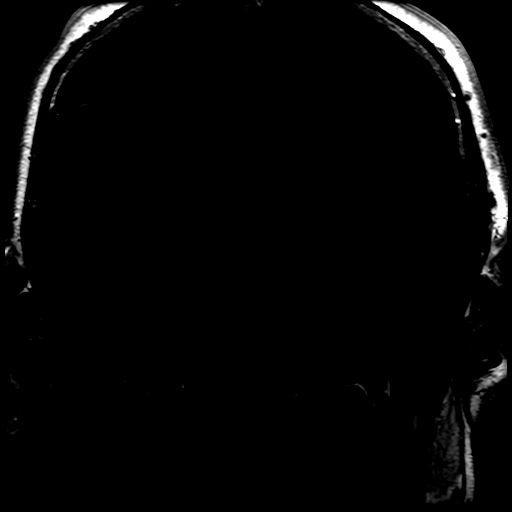

[Series 250: ADC · axial · 3.0mm · 0.94mm/px · z∈[-81,+61]mm · 4 of 49 slices shown]
[im 1/49]
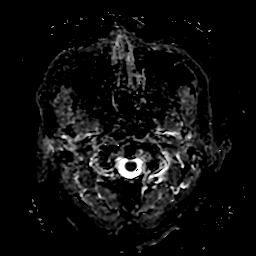
[im 17/49]
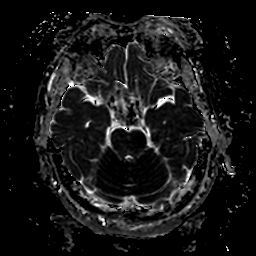
[im 33/49]
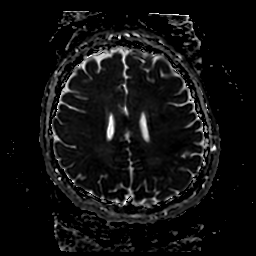
[im 49/49]
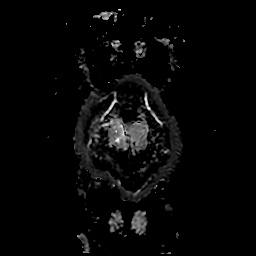

[24 of 48 positions shown; findings below may reference images not displayed]

FINDINGS: Brain: There has been interval trans-sphenoidal resection of a
pituitary macro adenoma. There is heterogeneous fluid and blood at
the resection site. There is peripheral enhancement centered in the
sella which may reflect postsurgical change, residual tumor, or
combination (13-9, 15-16). There is no masslike enhancement.
Previously seen mass effect on the optic chiasm has resolved.

Foci of susceptibility artifact along the falx may reflect
postoperative pneumocephalus.

There is no evidence of acute infarct. The brain parenchyma is
normal in appearance. The ventricles are normal in size. There is no
other mass lesion or abnormal enhancement. There is no midline
shift.

Vascular: Normal flow voids.

Skull and upper cervical spine: Normal marrow signal.

Sinuses/Orbits: Postsurgical changes are noted in the sinonasal
cavity. The globes and orbits are unremarkable.

Other: None.
IMPRESSION: 1. Postsurgical changes reflecting trans-sphenoidal resection of
pituitary macroadenoma. Heterogeneous fluid and blood with
peripheral enhancement at the resection bed may reflect
postoperative change, residual tumor, or combination there of,
though there is no residual masslike enhancement. This study can
serve as a baseline for future follow-up studies.
2. Previously seen mass effect on the optic chiasm has resolved.

## 2021-11-06 MED ORDER — GADOBUTROL 1 MMOL/ML IV SOLN
4.0000 mL | Freq: Once | INTRAVENOUS | Status: AC | PRN
  Administered 2021-11-06: 4 mL via INTRAVENOUS

## 2021-11-06 MED ORDER — HYDROCODONE-ACETAMINOPHEN 5-325 MG PO TABS: 1.0000 | ORAL_TABLET | ORAL | 0 refills | Status: DC | PRN

## 2021-11-06 MED ORDER — SALINE SPRAY 0.65 % NA SOLN
1.0000 | NASAL | Status: DC | PRN
  Filled 2021-11-06: qty 44

## 2021-11-06 MED ORDER — LIDOCAINE HCL (PF) 1 % IJ SOLN
INTRAMUSCULAR | Status: AC
  Filled 2021-11-06: qty 30

## 2021-11-06 NOTE — Progress Notes (Signed)
Neurosurgery Service ?Progress Note ? ?Subjective: No acute events overnight, vision stable / good, nasal fullness, no polyuria, no s/sx of his addison's-type Sx, overall feeling well  ? ?Objective: ?Vitals:  ? 11/06/21 0800 11/06/21 0900 11/06/21 1000 11/06/21 1200  ?BP: 112/81 115/81  114/85  ?Pulse: 71 72 71 80  ?Resp: '14 16 14 20  '$ ?Temp: 98.9 ?F (37.2 ?C)   98.3 ?F (36.8 ?C)  ?TempSrc: Oral   Oral  ?SpO2: 94% 93% 97% 94%  ?Weight:      ?Height:      ? ? ?Physical Exam: ?Aox3, PERRL, VFF to confrontation, EOMI, FS & SS, strength 5/5x4 ? ?Assessment & Plan: ?51 y.o. man s/p endonasal endoscopic resection of large pituitary tumor w/ preop panhypopituitarism, recovering well. ? ?-MRI pending ?-LD removed this morning ?-can discharge this afternoon pending PT eval, MRI results ? ?Erik Cole  ?11/06/21 ?12:42 PM ? ?

## 2021-11-06 NOTE — Discharge Summary (Signed)
Discharge Summary ? ?Date of Admission: 11/05/2021 ? ?Date of Discharge: 11/06/21 ? ?Attending Physician: Emelda Brothers, MD ? ?Hospital Course: Patient was admitted following an uncomplicated eeTSA for pituitary tumor. They were recovered in PACU and transferred to 4N ICU. Their preop symptoms were completely resolved, their lumbar drain was removed, a post-op MRI showed good tumor resection without concerning findings, their hospital course was uncomplicated and the patient was discharged home on 11/06/21. They will follow up in clinic with me in clinic in 2 weeks. ? ?Neurologic exam at discharge:  ?Aox3, PERRL, EOMI, FS & SS, strength 5/5x4, VFF to confrontation ? ?Discharge diagnosis: Pituitary tumor ? ?Judith Part, MD ?11/06/21 ?3:28 PM ? ?

## 2021-11-06 NOTE — Progress Notes (Signed)
?  Transition of Care (TOC) Screening Note ? ? ?Patient Details  ?Name: Erik Cole ?Date of Birth: Dec 13, 1970 ? ? ?Transition of Care (TOC) CM/SW Contact:    ?Benard Halsted, LCSW ?Phone Number: ?11/06/2021, 9:56 AM ? ? ? ?Transition of Care Department [facility]) has reviewed patient and no TOC needs have been identified at this time. We will continue to monitor patient advancement through interdisciplinary progression rounds. If new patient transition needs arise, please place a TOC consult. ? ? ?

## 2021-11-06 NOTE — Evaluation (Signed)
Physical Therapy Evaluation ?Patient Details ?Name: Erik Cole ?MRN: [id number] ?DOB: 02/12/1971 ?Today's Date: 11/06/2021 ? ?History of Present Illness ? 50 y.o. male presents to [facility] hospital on 11/05/2021 for Endonasal endoscopic transphenoidal resection of pituitary tumor as well as lumbar drain placement. PMH includes anxiety, OA, thyroid disease.  ?Clinical Impression ? Pt presents to PT with mild deficits in balance. Pt with lateral drift with head turns and increased postural sway with eyes closed, but otherwise tolerates dynamic gait challenges very well. Pt will benefit from increased opportunities to mobilize, with which this PT anticipates he will quickly progress to his baseline. Acute PT will continue to follow.   ?   ? ?Recommendations for follow up therapy are one component of a multi-disciplinary discharge planning process, led by the attending physician.  Recommendations may be updated based on patient status, additional functional criteria and insurance authorization. ? ?Follow Up Recommendations No PT follow up (anticipate progression to baseline with more opportunity for mobilization) ? ?  ?Assistance Recommended at Discharge PRN  ?Patient can return home with the following ? Assist for transportation ? ?  ?Equipment Recommendations None recommended by PT  ?Recommendations for Other Services ?    ?  ?Functional Status Assessment Patient has had a recent decline in their functional status and demonstrates the ability to make significant improvements in function in a reasonable and predictable amount of time.  ? ?  ?Precautions / Restrictions Precautions ?Precautions: None ?Restrictions ?Weight Bearing Restrictions: No  ? ?  ? ?Mobility ? Bed Mobility ?Overal bed mobility: Independent ?  ?  ?  ?  ?  ?  ?  ?  ? ?Transfers ?Overall transfer level: Independent ?  ?  ?  ?  ?  ?  ?  ?  ?  ?  ? ?Ambulation/Gait ?Ambulation/Gait assistance: Supervision ?Gait Distance (Feet): 600 Feet ?Assistive device:  None ?Gait Pattern/deviations: Step-through pattern ?Gait velocity: functional ?Gait velocity interpretation: >2.62 ft/sec, indicative of community ambulatory ?  ?General Gait Details: pt with increased lateral drift with head turns. With progressive increase in ambulation pt balance improves. ? ?Stairs ?Stairs: Yes ?Stairs assistance: Modified independent (Device/Increase time) ?Stair Management: Alternating pattern, One rail Right, Forwards ?Number of Stairs: 10 ?  ? ?Wheelchair Mobility ?  ? ?Modified Rankin (Stroke Patients Only) ?  ? ?  ? ?Balance Overall balance assessment: Needs assistance ?Sitting-balance support: No upper extremity supported, Feet supported ?Sitting balance-Leahy Scale: Good ?  ?  ?Standing balance support: No upper extremity supported, During functional activity ?Standing balance-Leahy Scale: Good ?  ?  ?Single Leg Stance - Left Leg: 10 (stopped at 10 seconds) ?  ?  ?  ?Rhomberg - Eyes Closed: 30 (increased postural sway) ?  ?  ?  ?  ?  ?   ? ? ? ?Pertinent Vitals/Pain Pain Assessment ?Pain Assessment: Faces ?Faces Pain Scale: Hurts little more ?Pain Location: head ?Pain Descriptors / Indicators: Headache ?Pain Intervention(s): Monitored during session  ? ? ?Home Living Family/patient expects to be discharged to:: Private residence ?Living Arrangements: Spouse/significant other;Children ?Available Help at Discharge: Family ?Type of Home: House ?Home Access: Stairs to enter ?Entrance Stairs-Rails: None ?Entrance Stairs-Number of Steps: 2-3 ?  ?  ?Home Equipment: Shower seat - built in ?   ?  ?Prior Function Prior Level of Function : Independent/Modified Independent;Driving;Working/employed ?  ?  ?  ?  ?  ?  ?  ?  ?  ? ? ?Hand Dominance  ? Dominant Hand: Right ? ?  ?  Extremity/Trunk Assessment  ? Upper Extremity Assessment ?Upper Extremity Assessment: Overall WFL for tasks assessed ?  ? ?Lower Extremity Assessment ?Lower Extremity Assessment: Overall WFL for tasks assessed ?  ? ?Cervical /  Trunk Assessment ?Cervical / Trunk Assessment: Normal  ?Communication  ? Communication: No difficulties  ?Cognition Arousal/Alertness: Awake/alert ?Behavior During Therapy: [facility] for tasks assessed/performed ?Overall Cognitive Status: Within Functional Limits for tasks assessed ?  ?  ?  ?  ?  ?  ?  ?  ?  ?  ?  ?  ?  ?  ?  ?  ?  ?  ?  ? ?  ?General Comments General comments (skin integrity, edema, etc.): VSS on RA ? ?  ?Exercises    ? ?Assessment/Plan  ?  ?PT Assessment Patient needs continued PT services  ?PT Problem List Decreased balance ? ?   ?  ?PT Treatment Interventions Neuromuscular re-education;Balance training;Gait training   ? ?PT Goals (Current goals can be found in the Care Plan section)  ?Acute Rehab PT Goals ?Patient Stated Goal: to return to prior level of function ?PT Goal Formulation: With patient ?Time For Goal Achievement: 11/20/21 ?Potential to Achieve Goals: Good ?Additional Goals ?Additional Goal #1: Pt will score >19/24 on the DGI to indicate a reduced risk for falls ? ?  ?Frequency Min 3X/week ?  ? ? ?Co-evaluation   ?  ?  ?  ?  ? ? ?  ?AM-PAC PT "6 Clicks" Mobility  ?Outcome Measure Help needed turning from your back to your side while in a flat bed without using bedrails?: None ?Help needed moving from lying on your back to sitting on the side of a flat bed without using bedrails?: None ?Help needed moving to and from a bed to a chair (including a wheelchair)?: None ?Help needed standing up from a chair using your arms (e.g., wheelchair or bedside chair)?: None ?Help needed to walk in hospital room?: A Little ?Help needed climbing 3-5 steps with a railing? : None ?6 Click Score: 23 ? ?  ?End of Session   ?Activity Tolerance: Patient tolerated treatment well ?Patient left: in bed;with call bell/phone within reach ?Nurse Communication: Mobility status ?PT Visit Diagnosis: Other symptoms and signs involving the nervous system (R29.898) ?  ? ?Time: 5638-7564 ?PT Time Calculation (min) (ACUTE  ONLY): 10 min ? ? ?Charges:   PT Evaluation ?$PT Eval Low Complexity: 1 Low ?  ?  ?   ? ? ?Zenaida Niece, PT, DPT ?Acute Rehabilitation ?Pager: [phone] ?Office [phone] ? ? ?Zenaida Niece ?11/06/2021, 3:02 PM ? ?

## 2021-11-06 NOTE — Evaluation (Signed)
Occupational Therapy Evaluation ?Patient Details ?Name: Erik Cole ?MRN: [id number] ?DOB: 01/29/1971 ?Today's Date: 11/06/2021 ? ? ?History of Present Illness 51 y.o. male presents to [facility] hospital on 11/05/2021 for Endonasal endoscopic transphenoidal resection of pituitary tumor as well as lumbar drain placement. PMH includes anxiety, OA, thyroid disease.  ? ?Clinical Impression ?  ?Pt typically independent in ADL and mobility. Pt works full time, today Pt is functioning at modified independent level. He was able to demonstrate UB and LB ADL, standing grooming, bed mobility, transfers all without any physical assist or verbal cues for safety.  Pt and wife with no questions or concerns about ADL in home setting. OT education complete. OT will sign off at this time.  ?   ? ?Recommendations for follow up therapy are one component of a multi-disciplinary discharge planning process, led by the attending physician.  Recommendations may be updated based on patient status, additional functional criteria and insurance authorization.  ? ?Follow Up Recommendations ? No OT follow up  ?  ?Assistance Recommended at Discharge PRN  ?Patient can return home with the following   ? ?  ?Functional Status Assessment ? Patient has had a recent decline in their functional status and demonstrates the ability to make significant improvements in function in a reasonable and predictable amount of time.  ?Equipment Recommendations ? None recommended by OT  ?  ?Recommendations for Other Services   ? ? ?  ?Precautions / Restrictions Restrictions ?Weight Bearing Restrictions: No  ? ?  ? ?Mobility Bed Mobility ?Overal bed mobility: Modified Independent ?  ?  ?  ?  ?  ?  ?  ?  ? ?Transfers ?Overall transfer level: Modified independent ?Equipment used: None ?  ?  ?  ?  ?  ?  ?  ?  ?  ? ?  ?Balance Overall balance assessment: No apparent balance deficits (not formally assessed) ?  ?  ?  ?  ?  ?  ?  ?  ?  ?  ?  ?  ?  ?  ?  ?  ?  ?  ?   ? ?ADL either  performed or assessed with clinical judgement  ? ?ADL Overall ADL's : Modified independent ?  ?  ?  ?  ?  ?  ?  ?  ?  ?  ?  ?  ?  ?  ?  ?  ?  ?  ?  ?General ADL Comments: able to perform UB and LB ADL, sink level grooming, transfers, without any assist - supervision for first time OOB - but no assist needed  ? ? ? ?Vision Baseline Vision/History: 1 Wears glasses ?Ability to See in Adequate Light: 0 Adequate ?Patient Visual Report: No change from baseline ?Vision Assessment?: No apparent visual deficits  ?   ?Perception   ?  ?Praxis   ?  ? ?Pertinent Vitals/Pain Pain Assessment ?Pain Assessment: 0-10 ?Pain Score: 5  ?Pain Location: headache ?Pain Descriptors / Indicators: Headache ?Pain Intervention(s): Monitored during session, RN gave pain meds during session  ? ? ? ?Hand Dominance Right ?  ?Extremity/Trunk Assessment Upper Extremity Assessment ?Upper Extremity Assessment: Overall WFL for tasks assessed ?  ?Lower Extremity Assessment ?Lower Extremity Assessment: Defer to PT evaluation ?  ?Cervical / Trunk Assessment ?Cervical / Trunk Assessment: Normal ?  ?Communication Communication ?Communication: No difficulties ?  ?Cognition Arousal/Alertness: Awake/alert ?Behavior During Therapy: [facility] for tasks assessed/performed ?Overall Cognitive Status: Within Functional Limits for tasks assessed ?  ?  ?  ?  ?  ?  ?  ?  ?  ?  ?  ?  ?  ?  ?  ?  ?  ?  ?  ?  General Comments  wife [name] present throughout session ? ?  ?Exercises   ?  ?Shoulder Instructions    ? ? ?Home Living Family/patient expects to be discharged to:: Private residence ?Living Arrangements: Spouse/significant other;Children (41 and 59 y/o) ?Available Help at Discharge: Family ?Type of Home: House ?Home Access: Stairs to enter ?Entrance Stairs-Number of Steps: 2-3 ?Entrance Stairs-Rails: None ?  ?  ?  ?Bathroom Shower/Tub: Gaffer;Door ?  ?Bathroom Toilet: Handicapped height ?  ?  ?Home Equipment: Shower seat - built in ?  ?  ?  ? ?  ?Prior  Functioning/Environment Prior Level of Function : Independent/Modified Independent;Driving;Working/employed ?  ?  ?  ?  ?  ?  ?  ?  ?  ? ?  ?  ?OT Problem List: Pain;Impaired sensation (flank numb currently) ?  ?   ?OT Treatment/Interventions:    ?  ?OT Goals(Current goals can be found in the care plan section) Acute Rehab OT Goals ?Patient Stated Goal: get home and get some good sleep ?OT Goal Formulation: With patient/family ?Time For Goal Achievement: 11/20/21 ?Potential to Achieve Goals: Good  ?OT Frequency:   ?  ? ?Co-evaluation   ?  ?  ?  ?  ? ?  ?AM-PAC OT "6 Clicks" Daily Activity     ?Outcome Measure Help from another person eating meals?: None ?Help from another person taking care of personal grooming?: None ?Help from another person toileting, which includes using toliet, bedpan, or urinal?: None ?Help from another person bathing (including washing, rinsing, drying)?: None ?Help from another person to put on and taking off regular upper body clothing?: None ?Help from another person to put on and taking off regular lower body clothing?: None ?6 Click Score: 24 ?  ?End of Session Equipment Utilized During Treatment: Gait belt ?Nurse Communication: Mobility status ? ?Activity Tolerance: Patient tolerated treatment well ?Patient left: in bed;Other (comment) (going to MRI) ? ?OT Visit Diagnosis: Pain ?Pain - part of body:  (headache)  ?              ?Time: 6237-6283 ?OT Time Calculation (min): 25 min ?Charges:  OT General Charges ?$OT Visit: 1 Visit ?OT Evaluation ?$OT Eval Low Complexity: 1 Low ? ?Jesse Sans OTR/L ?Acute Rehabilitation Services ?Pager: [phone] ?Office: [phone] ? ?Merri Ray Koron Godeaux ?11/06/2021, 1:27 PM ?

## 2021-11-07 NOTE — Anesthesia Postprocedure Evaluation (Signed)
Anesthesia Post Note ? ?Patient: Erik Cole ? ?Procedure(s) Performed: 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 (Nose) ?PLACEMENT OF LUMBAR DRAIN (Nose) ?TRANSPHENOIDAL APPROACH EXPOSURE (Nose) ? ?  ? ?Patient location during evaluation: PACU ?Anesthesia Type: General ?Level of consciousness: awake and alert ?Pain management: pain level controlled ?Vital Signs Assessment: post-procedure vital signs reviewed and stable ?Respiratory status: spontaneous breathing, nonlabored ventilation, respiratory function stable and patient connected to nasal cannula oxygen ?Cardiovascular status: blood pressure returned to baseline and stable ?Postop Assessment: no apparent nausea or vomiting ?Anesthetic complications: no ? ? ?No notable events documented. ? ?Last Vitals:  ?Vitals:  ? 11/06/21 1200 11/06/21 1531  ?BP: 114/85 113/81  ?Pulse: 80 80  ?Resp: 20 20  ?Temp: 36.8 ?C   ?SpO2: 94% 98%  ?  ?Last Pain:  ?Vitals:  ? 11/06/21 1531  ?TempSrc:   ?PainSc: 2   ? ? ?  ?  ?  ?  ?  ?  ? ?Kita Neace ? ? ? ? ?

## 2021-11-28 ENCOUNTER — Ambulatory Visit: Payer: BC Managed Care – PPO | Admitting: Endocrinology

## 2022-01-01 ENCOUNTER — Encounter: Payer: Self-pay | Admitting: Internal Medicine

## 2022-01-01 ENCOUNTER — Ambulatory Visit (INDEPENDENT_AMBULATORY_CARE_PROVIDER_SITE_OTHER): Payer: BC Managed Care – PPO | Admitting: Internal Medicine

## 2022-01-01 VITALS — BP 140/80 | HR 84 | Ht 71.0 in | Wt 225.0 lb

## 2022-01-01 DIAGNOSIS — E038 Other specified hypothyroidism: Secondary | ICD-10-CM

## 2022-01-01 DIAGNOSIS — E893 Postprocedural hypopituitarism: Secondary | ICD-10-CM

## 2022-01-01 DIAGNOSIS — E2749 Other adrenocortical insufficiency: Secondary | ICD-10-CM

## 2022-01-01 DIAGNOSIS — E230 Hypopituitarism: Secondary | ICD-10-CM

## 2022-01-01 HISTORY — DX: Postprocedural hypopituitarism: E89.3

## 2022-01-01 HISTORY — DX: Other adrenocortical insufficiency: E27.49

## 2022-01-01 HISTORY — DX: Hypopituitarism: E23.0

## 2022-01-01 HISTORY — DX: Other specified hypothyroidism: E03.8

## 2022-01-01 MED ORDER — HYDROCORTISONE 10 MG PO TABS: ORAL_TABLET | ORAL | 2 refills | Status: DC

## 2022-01-01 NOTE — Progress Notes (Signed)
Name: Erik Cole  MRN/ DOB: [id number], May 31, 1971    Age/ Sex: 51 y.o., male     PCP: Elise Benne   Reason for Endocrinology Evaluation: Pituitary macroadenoma     Initial Endocrinology Clinic Visit: 08/11/2021    PATIENT IDENTIFIER: 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 He has followed with [facility] since 08/11/2021 for consultative assistance with management of his pituitary macroadenoma.   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  He is S/P transsphenoidal pituitary resection with Dr. Zada Finders 11/05/2021  Postoperative MRI showed surgical changes   Preoperatively the patient had low serum cortisol at 1 UG/DL with a low normal ACTH at 13 PG/mL, low LH 0.41 mIU/mL , low normal FSH, normal prolactin as well as TSH but low free T4 at 0.34 NG/DL.  Patient also has been noted with undetectable testosterone  He was started on LT-for replacement, and prednisone by his previous endocrinologist  SUBJECTIVE:    Today (01/01/2022):  Erik Cole is here for adrenal insufficiency and hypothyroidism.   He is S/P transsphenoidal pituitary resection in April 2023  He continues with nasal congestion  Has noted hot flashes that he attributes to low testosterone so he restarted testosterone tabs through integrative health   Denies recent ED Energy stable  which has improved  since being on levothyroxine and prednisone   Prior to his diagnosis he was having extreme fatigue, arthralgias and myalgia   Has takes testosterone through integrative health through sublingual tablets ?     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   Obesity    Thyroid disease    Transaminitis    hx mild, thought fibrate related - w/u ~2008 neg HbsAg, Hep C, ANA, nl iron/ferritin, ceruloplasmin, a1-antitrypsin, normal [place]   Past Surgical History:  Past Surgical History:  Procedure Laterality Date   COLONOSCOPY  10/2015   one polyp, diverticulosis, rpt 5 yrs ([facility])   CRANIOTOMY N/A 11/05/2021   Procedure: 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;  Surgeon: Judith Part, MD;  Location: [facility];  Service: Neurosurgery;  Laterality: N/A;   GANGLION CYST EXCISION Right 2008    wrist   PLACEMENT OF LUMBAR DRAIN N/A 11/05/2021   Procedure: PLACEMENT OF LUMBAR DRAIN;  Surgeon: Judith Part, MD;  Location: [facility];  Service: Neurosurgery;  Laterality: N/A;   TRANSPHENOIDAL APPROACH EXPOSURE N/A 11/05/2021   Procedure: TRANSPHENOIDAL APPROACH EXPOSURE;  Surgeon: Jerrell Belfast, MD;  Location: [facility];  Service: ENT;  Laterality: N/A;   Social History:  reports that he has never smoked. He has never used smokeless tobacco. He reports that he does not currently use alcohol. He reports that he does not use drugs. Family History:  Family History  Problem Relation Age of Onset  Hypertension Mother    Hyperlipidemia Father        TG   Diverticulitis Father    Diabetes Maternal Grandmother    Colon polyps Maternal Grandfather    Coronary artery disease Paternal Grandfather 28       unhealthy   Breast cancer Other 93       great grandmother, maternal   Stroke Neg Hx    Colon cancer Neg Hx    Esophageal cancer Neg Hx    Rectal cancer Neg Hx    Stomach cancer Neg Hx    Adrenal  disorder Neg Hx      HOME MEDICATIONS: Allergies as of 01/01/2022   No Known Allergies      Medication List        Accurate as of January 01, 2022 12:11 PM. If you have any questions, ask your nurse or doctor.          STOP taking these medications    DHEA PO Stopped by: Dorita Sciara, MD   HYDROcodone-acetaminophen 5-325 MG tablet Commonly known as: NORCO/VICODIN Stopped by: Dorita Sciara, MD   OVER THE COUNTER MEDICATION Stopped by: Dorita Sciara, MD   PREGNENOLONE MICRONIZED PO Stopped by: Dorita Sciara, MD       TAKE these medications    ALPRAZolam 0.5 MG tablet Commonly known as: Xanax Take 1 tablet (0.5 mg total) by mouth at bedtime as needed for anxiety. What changed: when to take this   FISH OIL PO Take 1 capsule by mouth at bedtime.   fluticasone 50 MCG/ACT nasal spray Commonly known as: FLONASE Place 2 sprays into both nostrils daily as needed for allergies or rhinitis.   GINGER PO Take 550 mg by mouth 2 (two) times daily.   GNP GARLIC EXTRACT PO Take 102 mg by mouth 2 (two) times daily.   hydrocortisone 10 MG tablet Commonly known as: CORTEF Take 1.5 tablets (15 mg total) by mouth daily with breakfast AND 1 tablet (10 mg total) at bedtime. Started by: Dorita Sciara, MD   levothyroxine 50 MCG tablet Commonly known as: SYNTHROID Take 1 tablet (50 mcg total) by mouth daily.   MULTIVITAMIN PO Take 1 tablet by mouth every 7 (seven) days.   NONFORMULARY OR COMPOUNDED ITEM Place 100 mg under the tongue in the morning and at bedtime. Testosterone [facility]   predniSONE 5 MG tablet Commonly known as: DELTASONE Take 1 tablet (5 mg total) by mouth daily.   TURMERIC PO Take 1 capsule by mouth daily.   Vitamin D 125 MCG (5000 UT) Caps Take 5,000 Units by mouth daily at 6 (six) AM.          OBJECTIVE:   PHYSICAL EXAM: VS: BP 140/80 (BP Location: Left Arm, Patient Position:  Sitting, Cuff Size: Small)   Pulse 84   Ht '5\' 11"'$  (1.803 m)   Wt 225 lb (102.1 kg)   SpO2 97%   BMI 31.38 kg/m    Body surface area is 2.26 meters squared.    EXAM: General: Pt appears well and is in NAD  Eyes: External eye exam normal without stare, lid lag or exophthalmos.  EOM intact.   Neck: General: Supple without adenopathy. Thyroid: Thyroid size normal.  No goiter or nodules appreciated.  Lungs: Clear with good BS bilat with no rales, rhonchi, or wheezes  Heart: Auscultation: RRR.  Abdomen: Normoactive bowel sounds, soft, nontender, without masses or organomegaly palpable  Extremities:  BL LE: No  pretibial edema normal ROM and strength.  Mental Status: Judgment, insight: Intact Orientation: Oriented to time, place, and person Mood and affect: No depression, anxiety, or agitation     DATA REVIEWED: 11/21/2021  Testosterone 9 ng/dL  FT4 0.83 ng/dL TSH 1.680 uIU/mL  LH 0.8 mIU/mL  FSH 1.5 ACTH 34.5 pg/mL  IGF-1 169 ng/mL  AM cortisol 10.6 ug/dL    ASSESSMENT / PLAN / RECOMMENDATIONS:   Hx pituitary macroadenoma, S/P transphenoidal pituitary resection 10/2021  - He is S/P transsphenoidal pituitary resection in April, 2023 -He has recovered well except for mild sinus congestion   2.  Secondary adrenal insufficiency  -Clinically he feels well -He was advised to obtain a medical alert bracelet -We had discussed sick day rules -He had labs showing improved ACTH level in May 2023, which is reassuring, and I have discussed with him switching prednisone to hydrocortisone in the hopes to see if he has regained his HPA axis   Medications  Stop prednisone Start hydrocortisone 10 mg, 1.5 tablet with breakfast and 1 tablet between 2-4 PM  3.  Secondary hypothyroidism  -Patient is clinically euthyroid -His most recent free T4 is normal -No changes   Medication Continue levothyroxine 50 mcg daily     4.  Hypogonadotropic hypogonadism:  -He has been on  oral testosterone through integrative health?.  I have explained to the patient that I am not familiar with this -We will check fasting 8 AM testosterone level, if his levels remain low I would recommend topical vs intramuscular testosterone -He would like to avoid intramuscular testosterone at this time  Follow-up in 3 months  Addendum: Patient was given printed lab orders to take to [facility]   Signed electronically by: Mack Guise, MD  [facility]  [facility] [address] [place], [place] [address] Phone: [phone] FAX: [phone]      CC: Elise Benne [address] [place] [address] Phone: [phone]  Fax: [phone]   Return to Endocrinology clinic as below: No future appointments.

## 2022-01-01 NOTE — Patient Instructions (Addendum)
STOP Prednisone    Start Hydrocortisone 10 mg , ONE AND A HALF tablets with Breakfast and ONE tablet between 2-4 pm      ADRENAL INSUFFICIENCY SICK DAY RULES:  Should you face an extreme emotional or physical stress such as trauma, surgery or acute illness, this will require extra steroid coverage so that the body can meet that stress.   Without increasing the steroid dose you may experience severe weakness, headache, dizziness, nausea and vomiting and possibly a more serious deterioration in health.  Typically the dose of steroids will only need to be increased for a couple of days if you have an illness that is transient and managed in the community.   If you are unable to take/absorb an increased dose of steroids orally because of vomiting or diarrhea, you will urgently require steroid injections and should present to an Emergency Department.  The general advice for any serious illness is as follows: Double the normal daily steroid dose for up to 3 days if you have a temperature of more than 37.50C (99.37F) with signs of sickness, or severe emotional or physical distress Contact your primary care doctor and Endocrinologist if the illness worsens or it lasts for more than 3 days.  In cases of severe illness, urgent medical assistance should be promptly sought. If you experience vomiting/diarrhea or are unable to take steroids by mouth, please administer the Hydrocortisone injection kit and seek urgent medical help.

## 2022-01-02 ENCOUNTER — Other Ambulatory Visit: Payer: Self-pay | Admitting: Internal Medicine

## 2022-01-07 ENCOUNTER — Telehealth: Payer: Self-pay | Admitting: Internal Medicine

## 2022-01-07 LAB — COMPREHENSIVE METABOLIC PANEL
ALT: 14 IU/L (ref 0–44)
AST: 16 IU/L (ref 0–40)
Albumin/Globulin Ratio: 1.9 (ref 1.2–2.2)
Albumin: 4.5 g/dL (ref 4.0–5.0)
Alkaline Phosphatase: 79 IU/L (ref 44–121)
BUN/Creatinine Ratio: 20 (ref 9–20)
BUN: 18 mg/dL (ref 6–24)
Bilirubin Total: 0.2 mg/dL (ref 0.0–1.2)
CO2: 23 mmol/L (ref 20–29)
Calcium: 9.3 mg/dL (ref 8.7–10.2)
Chloride: 101 mmol/L (ref 96–106)
Creatinine, Ser: 0.88 mg/dL (ref 0.76–1.27)
Globulin, Total: 2.4 g/dL (ref 1.5–4.5)
Glucose: 93 mg/dL (ref 70–99)
Potassium: 4.3 mmol/L (ref 3.5–5.2)
Sodium: 139 mmol/L (ref 134–144)
Total Protein: 6.9 g/dL (ref 6.0–8.5)
eGFR: 105 mL/min/{1.73_m2} (ref >59)

## 2022-01-07 LAB — ACTH: ACTH: 37.6 pg/mL (ref 7.2–63.3)

## 2022-01-07 LAB — LUTEINIZING HORMONE: LH: 1.1 m[IU]/mL — ABNORMAL LOW (ref 1.7–8.6)

## 2022-01-07 LAB — TSH: TSH: 1.44 u[IU]/mL (ref 0.450–4.500)

## 2022-01-07 LAB — CORTISOL: Cortisol: 9.3 ug/dL (ref 6.2–19.4)

## 2022-01-07 LAB — T4, FREE: Free T4: 1.05 ng/dL (ref 0.82–1.77)

## 2022-01-07 LAB — PROLACTIN: Prolactin: 17.6 ng/mL — ABNORMAL HIGH (ref 4.0–15.2)

## 2022-01-07 LAB — INSULIN-LIKE GROWTH FACTOR: Insulin-Like GF-1: 169 ng/mL (ref 81–263)

## 2022-01-07 LAB — TESTOSTERONE, TOTAL, LC/MS/MS: Testosterone, total: 13.8 ng/dL — ABNORMAL LOW (ref 264.0–916.0)

## 2022-01-07 MED ORDER — TESTOSTERONE 20.25 MG/1.25GM (1.62%) TD GEL: 2.0000 | Freq: Every day | TRANSDERMAL | 5 refills | Status: DC

## 2022-01-07 NOTE — Telephone Encounter (Signed)
A portal message was sent to start Androgel due to continued low testosterone    ACTH and TFT's within range     Start Andogel 1 pump to each shoulder daily     Abby Nena Jordan, MD  [facility] Endocrinology  [facility] [address] [place], [place] [address] Phone: [phone] FAX: [phone]

## 2022-01-11 ENCOUNTER — Encounter: Payer: Self-pay | Admitting: Internal Medicine

## 2022-03-30 ENCOUNTER — Other Ambulatory Visit: Payer: Self-pay | Admitting: Medical

## 2022-04-24 ENCOUNTER — Encounter: Payer: Self-pay | Admitting: Internal Medicine

## 2022-04-28 NOTE — Progress Notes (Unsigned)
Name: Erik Cole  MRN/ DOB: [id number], January 17, 1971    Age/ Sex: 51 y.o., male     PCP: Elise Benne   Reason for Endocrinology Evaluation: Pituitary macroadenoma     Initial Endocrinology Clinic Visit: 08/11/2021    PATIENT IDENTIFIER: 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 He has followed with [facility] since 08/11/2021 for consultative assistance with management of his pituitary macroadenoma.   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  He is S/P transsphenoidal pituitary resection with Dr. Zada Finders 11/05/2021  Postoperative MRI showed surgical changes   Preoperatively the patient had low serum cortisol at 1 UG/DL with a low normal ACTH at 13 PG/mL, low LH 0.41 mIU/mL , low normal FSH, normal prolactin as well as TSH but low free T4 at 0.34 NG/DL.  Patient also has been noted with undetectable testosterone  He was started on LT-for replacement, and prednisone by his previous endocrinologist  Testosterone replacement started 12/2021  SUBJECTIVE:    Today (04/29/2022):  Erik Cole is here for adrenal insufficiency, hypothyroidism, and hypogonadism    He is S/P transsphenoidal pituitary resection in April 2023 He was started on Testosterone 12/2021  Nasal congestion is improving  Continues with  hot flashes  Has noted weight gain  Has anxiety issues lately  Denies constipation or diarrhea    Medication Hydrocortisone 10 mg, 1.5 tablet with breakfast and 1 tablet between 2-4 PM Levothyroxine 50 mcg daily Androgel 2 pumps daily       HISTORY:  Past Medical History:  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   Obesity    Thyroid disease    Transaminitis    hx mild, thought fibrate related - w/u ~2008 neg HbsAg, Hep C, ANA, nl iron/ferritin, ceruloplasmin, a1-antitrypsin, normal [place]   Past Surgical History:  Past Surgical History:  Procedure Laterality Date   COLONOSCOPY  10/2015   one polyp, diverticulosis, rpt 5 yrs ([facility])   CRANIOTOMY N/A 11/05/2021   Procedure: 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;  Surgeon: Judith Part, MD;  Location: [place];  Service: Neurosurgery;  Laterality: N/A;   GANGLION CYST EXCISION Right 2008    wrist   PLACEMENT OF LUMBAR DRAIN N/A 11/05/2021   Procedure: PLACEMENT OF LUMBAR DRAIN;  Surgeon: Judith Part, MD;  Location: [place];  Service: Neurosurgery;  Laterality: N/A;   TRANSPHENOIDAL APPROACH EXPOSURE N/A 11/05/2021   Procedure: TRANSPHENOIDAL APPROACH EXPOSURE;  Surgeon: Jerrell Belfast, MD;  Location: [place];  Service: ENT;  Laterality: N/A;   Social History:  reports that he has never smoked. He has never used smokeless tobacco. He reports that he does not currently use alcohol. He reports that he does not use drugs. Family History:  Family History  Problem Relation Age of Onset   Hypertension Mother    Hyperlipidemia Father        TG  Diverticulitis Father    Diabetes Maternal Grandmother    Colon polyps Maternal Grandfather    Coronary artery disease Paternal Grandfather 80       unhealthy   Breast cancer Other 7       great grandmother, maternal   Stroke Neg Hx    Colon cancer Neg Hx    Esophageal cancer Neg Hx    Rectal cancer Neg Hx    Stomach cancer Neg Hx    Adrenal disorder Neg Hx      HOME MEDICATIONS: Allergies as of 04/29/2022   No Known Allergies      Medication  List        Accurate as of April 29, 2022  7:37 AM. If you have any questions, ask your nurse or doctor.          STOP taking these medications    NONFORMULARY OR COMPOUNDED ITEM Stopped by: Dorita Sciara, MD   predniSONE 5 MG tablet Commonly known as: DELTASONE Stopped by: Dorita Sciara, MD       TAKE these medications    ALPRAZolam 0.5 MG tablet Commonly known as: Xanax Take 1 tablet (0.5 mg total) by mouth at bedtime as needed for anxiety. What changed: when to take this   FISH OIL PO Take 1 capsule by mouth at bedtime.   fluticasone 50 MCG/ACT nasal spray Commonly known as: FLONASE Place 2 sprays into both nostrils daily as needed for allergies or rhinitis.   GINGER PO Take 550 mg by mouth 2 (two) times daily.   GNP GARLIC EXTRACT PO Take 016 mg by mouth 2 (two) times daily.   hydrocortisone 10 MG tablet Commonly known as: CORTEF Take 1.5 tablets (15 mg total) by mouth daily with breakfast AND 1 tablet (10 mg total) at bedtime.   levothyroxine 50 MCG tablet Commonly known as: SYNTHROID Take 1 tablet (50 mcg total) by mouth daily.   MULTIVITAMIN PO Take 1 tablet by mouth every 7 (seven) days.   Testosterone 20.25 MG/1.25GM (1.62%) Gel Commonly known as: AndroGel Place 2 Pump onto the skin daily in the afternoon.   TURMERIC PO Take 1 capsule by mouth daily.   Vitamin D 125 MCG (5000 UT) Caps Take 5,000 Units by mouth daily at 6 (six) AM.          OBJECTIVE:   PHYSICAL EXAM: VS: Ht $Remo'5\' 11"'VdqZP$  (1.803 m)   Wt 235 lb (106.6 kg)   BMI 32.78 kg/m    Body surface area is 2.31 meters squared.    EXAM: General: Pt appears well and is in NAD  Eyes: External eye exam normal without stare, lid lag or exophthalmos.  EOM intact.   Neck: General: Supple without adenopathy. Thyroid: Thyroid size normal.  No goiter or nodules appreciated.  Lungs: Clear with good BS bilat with no rales, rhonchi, or wheezes  Heart: Auscultation: RRR.   Abdomen: Normoactive bowel sounds, soft, nontender, without masses or organomegaly palpable  Extremities:  BL LE: No pretibial edema normal ROM and strength.  Mental Status: Judgment, insight: Intact Orientation: Oriented to time, place, and person Mood and affect: No depression, anxiety, or agitation     DATA REVIEWED:  Latest Reference Range & Units 01/02/22 08:16  Sodium 134 - 144 mmol/L 139  Potassium 3.5 - 5.2 mmol/L 4.3  Chloride 96 - 106 mmol/L 101  CO2 20 - 29 mmol/L 23  Glucose 70 - 99 mg/dL 93  BUN 6 - 24 mg/dL 18  Creatinine  0.76 - 1.27 mg/dL 0.88  Calcium 8.7 - 10.2 mg/dL 9.3  BUN/Creatinine Ratio 9 - 20  20  eGFR >59 mL/min/1.73 105  Alkaline Phosphatase 44 - 121 IU/L 79  Albumin 4.0 - 5.0 g/dL 4.5  Albumin/Globulin Ratio 1.2 - 2.2  1.9  AST 0 - 40 IU/L 16  ALT 0 - 44 IU/L 14  Total Protein 6.0 - 8.5 g/dL 6.9  Total Bilirubin 0.0 - 1.2 mg/dL 0.2      Latest Reference Range & Units 01/02/22 08:16  ACTH 7.2 - 63.3 pg/mL 37.6  Cortisol 6.2 - 19.4 ug/dL 9.3  Insulin-Like GF-1 81 - 263 ng/mL 169  LH 1.7 - 8.6 mIU/mL 1.1 (L)  Prolactin 4.0 - 15.2 ng/mL 17.6 (H)  Glucose 70 - 99 mg/dL 93  Testosterone, total 264.0 - 916.0 ng/dL 13.8 (L)  TSH 0.450 - 4.500 uIU/mL 1.440  T4,Free(Direct) 0.82 - 1.77 ng/dL 1.05    11/21/2021  Testosterone 9 ng/dL  FT4 0.83 ng/dL TSH 1.680 uIU/mL  LH 0.8 mIU/mL  FSH 1.5 ACTH 34.5 pg/mL  IGF-1 169 ng/mL  AM cortisol 10.6 ug/dL    ASSESSMENT / PLAN / RECOMMENDATIONS:   Hx pituitary macroadenoma, S/P transphenoidal pituitary resection 10/2021  - He is S/P transsphenoidal pituitary resection in April, 2023 -He has recovered well except for mild sinus congestion   2.  Secondary adrenal insufficiency  Annual Wellness Visit     Patient: Erik Cole, Male    DOB: September 14, 1970, 51 y.o.   MRN: [id number]  Subjective  Chief Complaint  Patient presents with   Follow-up    Erik Cole is a 51 y.o. male who  presents today for his Annual Wellness Visit. He reports consuming a {diet types:17450} diet. {Exercise:19826} He generally feels {well/fairly well/poorly:18703}. He reports sleeping {well/fairly well/poorly:18703}. He {does/does not:200015} have additional problems to discuss today.   HPI  {VISON DENTAL STD PSA (Optional):27386}   {History (Optional):23778}  Medications: Outpatient Medications Prior to Visit  Medication Sig   ALPRAZolam (XANAX) 0.5 MG tablet Take 1 tablet (0.5 mg total) by mouth at bedtime as needed for anxiety. (Patient taking differently: Take 0.5 mg by mouth daily as needed for anxiety.)   Cholecalciferol (VITAMIN D) 125 MCG (5000 UT) CAPS Take 5,000 Units by mouth daily at 6 (six) AM.   fluticasone (FLONASE) 50 MCG/ACT nasal spray Place 2 sprays into both nostrils daily as needed for allergies or rhinitis.   Ginger, Zingiber officinalis, (GINGER PO) Take 550 mg by mouth 2 (two) times daily.   GNP GARLIC EXTRACT PO Take 342 mg by mouth 2 (two) times daily.   hydrocortisone (CORTEF) 10 MG tablet Take 1.5 tablets (15 mg total) by mouth daily with breakfast AND 1 tablet (10 mg total) at bedtime.   levothyroxine (SYNTHROID) 50 MCG tablet Take 1 tablet (50 mcg total) by mouth daily.   Multiple Vitamin (MULTIVITAMIN PO) Take 1 tablet by mouth every 7 (seven) days.   Omega-3 Fatty Acids (FISH OIL PO) Take 1 capsule by mouth at bedtime.   Testosterone (ANDROGEL) 20.25 MG/1.25GM (1.62%) GEL Place 2 Pump onto the skin daily in the afternoon.   TURMERIC PO Take 1 capsule by mouth daily.   [DISCONTINUED] predniSONE (DELTASONE) 5 MG tablet Take 1 tablet (5 mg total) by mouth daily.   [DISCONTINUED] NONFORMULARY OR COMPOUNDED ITEM Place 100 mg under the tongue in the morning and at bedtime. Testosterone Erik Cole   No facility-administered medications prior to visit.    No Known  Allergies  Patient Care Team: Saguier, Iris Pert as PCP - General (Internal  Medicine) Alda Berthold, DO as Consulting Physician (Neurology)  ROS      Objective  BP 124/80 (BP Location: Left Arm, Patient Position: Sitting, Cuff Size: Small)   Pulse 73   Ht _0  (1.803 m)   Wt 235 lb (106.6 kg)   SpO2 98%   BMI 32.78 kg/m  {Vitals History (Optional):23777}  Physical Exam    Most recent functional status assessment:    11/05/2021    8:00 PM  In your present state of health, do you have any difficulty performing the following activities:  Hearing? 0  Vision? 0  Difficulty concentrating or making decisions? 0  Walking or climbing stairs? 0  Dressing or bathing? 0  Doing errands, shopping? 0   Most recent fall risk assessment:    05/16/2021   10:14 AM  Fall Risk   Falls in the past year? 0  Number falls in past yr: 0  Injury with Fall? 0    Most recent depression screenings:    09/18/2020    1:00 PM 10/08/2017    3:56 PM  PHQ 2/9 Scores  PHQ - 2 Score 0 0  PHQ- 9 Score  1   Most recent cognitive screening:     No data to display         Most recent Audit-C alcohol use screening     No data to display         A score of 3 or more in women, and 4 or more in men indicates increased risk for alcohol abuse, EXCEPT if all of the points are from question 1   Vision/Hearing Screen: No results found.  {Labs (Optional):23779}  No results found for any visits on 04/29/22.    Assessment & Plan   Annual wellness visit done today including the all of the following: Reviewed patient's Family Medical History Reviewed and updated list of patient's medical providers Assessment of cognitive impairment was done Assessed patient's functional ability Established a written schedule for Cole screening [place] Completed and Reviewed  Exercise Activities and Dietary recommendations  Goals   None     Immunization History  Administered Date(s) Administered   Influenza Split 04/15/2011   Influenza,inj,Quad  PF,6+ Mos 04/11/2013, 07/20/2014, 07/24/2015, 07/24/2016, 07/21/2017, 05/20/2018   Influenza-Unspecified 07/21/2017, 05/20/2018   Td 10/11/2009    Cole Maintenance  Topic Date Due   COVID-19 Vaccine (1) Never done   Zoster Vaccines- Shingrix (1 of 2) Never done   TETANUS/TDAP  11/06/2019   INFLUENZA VACCINE  02/10/2022   COLONOSCOPY (Pts 45-51yr Insurance coverage will need to be confirmed)  06/14/2031   Hepatitis C Screening  Completed   HIV Screening  Completed   HPV VACCINES  Aged Out     Discussed Cole benefits of physical activity, and encouraged him to engage in regular exercise appropriate for his age and condition.    Problem List Items Addressed This Visit       Endocrine   Hypogonadotropic hypogonadism ([facility]   Secondary hypothyroidism   Secondary adrenal insufficiency ([place]     Other   S/P transsphenoidal hypophysectomy ([facility]   Relevant Orders   Basic metabolic panel   ACTH   Cortisol   Prolactin   T4, free   TSH   Testosterone    Return in about 4 months (around 08/30/2022) for Pituitary.     IDorita Sciara MD     -  Clinically he feels well -He was advised to obtain a medical alert bracelet -We had discussed sick day rules -He had labs showing improved ACTH level in May 2023, which is reassuring, and I have discussed with him switching prednisone to hydrocortisone in the hopes to see if he has regained his HPA axis   Medications  Stop prednisone Start hydrocortisone 10 mg, 1.5 tablet with breakfast and 1 tablet between 2-4 PM  3.  Secondary hypothyroidism  -Patient is clinically euthyroid -His most recent free T4 is normal -No changes   Medication Continue levothyroxine 50 mcg daily     4.  Hypogonadotropic hypogonadism:  -He has been on oral testosterone through integrative Cole?.  I have explained to the patient that I am not familiar with this -We will check fasting 8 AM testosterone level, if his levels  remain low I would recommend topical vs intramuscular testosterone -He would like to avoid intramuscular testosterone at this time  Follow-up in 3 months   Signed electronically by: Mack Guise, MD  [facility]  [facility] [address] [place], [place] [address] Phone: [phone] FAX: [phone]      CC: Elise Benne [address] [place] [address] Phone: [phone]  Fax: [phone]   Return to Endocrinology clinic as below: Future Appointments  Date Time Provider [place]  04/29/2022  7:50 AM Emmalin Jaquess, Melanie Crazier, MD [facility] None

## 2022-04-29 ENCOUNTER — Telehealth (INDEPENDENT_AMBULATORY_CARE_PROVIDER_SITE_OTHER): Payer: BC Managed Care – PPO | Admitting: Internal Medicine

## 2022-04-29 ENCOUNTER — Encounter: Payer: Self-pay | Admitting: Internal Medicine

## 2022-04-29 VITALS — BP 124/80 | HR 73 | Ht 71.0 in | Wt 235.0 lb

## 2022-04-29 DIAGNOSIS — E2749 Other adrenocortical insufficiency: Secondary | ICD-10-CM | POA: Diagnosis not present

## 2022-04-29 DIAGNOSIS — E893 Postprocedural hypopituitarism: Secondary | ICD-10-CM

## 2022-04-29 DIAGNOSIS — E038 Other specified hypothyroidism: Secondary | ICD-10-CM | POA: Diagnosis not present

## 2022-04-29 DIAGNOSIS — E230 Hypopituitarism: Secondary | ICD-10-CM

## 2022-04-29 LAB — BASIC METABOLIC PANEL
BUN: 13 mg/dL (ref 6–23)
CO2: 26 mEq/L (ref 19–32)
Calcium: 9.8 mg/dL (ref 8.4–10.5)
Chloride: 103 mEq/L (ref 96–112)
Creatinine, Ser: 0.92 mg/dL (ref 0.40–1.50)
GFR: 96.75 mL/min (ref >60.00)
Glucose, Bld: 98 mg/dL (ref 70–99)
Potassium: 4.1 mEq/L (ref 3.5–5.1)
Sodium: 139 mEq/L (ref 135–145)

## 2022-04-29 LAB — CORTISOL: Cortisol, Plasma: 17.6 ug/dL

## 2022-04-29 LAB — T4, FREE: Free T4: 0.91 ng/dL (ref 0.60–1.60)

## 2022-04-29 LAB — TSH: TSH: 1.68 u[IU]/mL (ref 0.35–5.50)

## 2022-04-29 LAB — TESTOSTERONE: Testosterone: 177.51 ng/dL — ABNORMAL LOW (ref 300.00–890.00)

## 2022-04-30 MED ORDER — TESTOSTERONE 20.25 MG/1.25GM (1.62%) TD GEL: 81.0000 mg | Freq: Every day | TRANSDERMAL | 5 refills | Status: DC

## 2022-04-30 MED ORDER — HYDROCORTISONE 10 MG PO TABS: ORAL_TABLET | ORAL | 3 refills | Status: DC

## 2022-04-30 MED ORDER — LEVOTHYROXINE SODIUM 50 MCG PO TABS: 50.0000 ug | ORAL_TABLET | Freq: Every day | ORAL | 3 refills | Status: DC

## 2022-05-01 ENCOUNTER — Encounter: Payer: Self-pay | Admitting: Internal Medicine

## 2022-05-03 LAB — PROLACTIN: Prolactin: 9 ng/mL (ref 2.0–18.0)

## 2022-05-03 LAB — ACTH: C206 ACTH: 29 pg/mL (ref 6–50)

## 2022-05-06 ENCOUNTER — Other Ambulatory Visit: Payer: Self-pay

## 2022-05-06 ENCOUNTER — Telehealth: Payer: Self-pay | Admitting: Internal Medicine

## 2022-05-06 DIAGNOSIS — E2749 Other adrenocortical insufficiency: Secondary | ICD-10-CM

## 2022-05-06 NOTE — Telephone Encounter (Signed)
I have discussed his normal and improved ACTH level    Patient will present to [facility] 8 AM, fasting on Friday   He will hold hydrocortisone the prior evening, he was also advised NOT to take his hydrocortisone the morning of the test but he should take it with him to the lab and take it immediately after his cortisol level is drawn   Cortisol will be released to [facility]  Patient expressed understanding

## 2022-05-14 LAB — CORTISOL: Cortisol: 9.7 ug/dL (ref 6.2–19.4)

## 2022-06-15 ENCOUNTER — Encounter: Payer: Self-pay | Admitting: Medical

## 2022-06-16 ENCOUNTER — Encounter: Payer: Self-pay | Admitting: Gastroenterology

## 2022-06-16 ENCOUNTER — Ambulatory Visit (INDEPENDENT_AMBULATORY_CARE_PROVIDER_SITE_OTHER): Payer: BC Managed Care – PPO | Admitting: Gastroenterology

## 2022-06-16 ENCOUNTER — Telehealth: Payer: Self-pay | Admitting: Gastroenterology

## 2022-06-16 VITALS — BP 140/80 | HR 93 | Ht 71.0 in | Wt 233.2 lb

## 2022-06-16 DIAGNOSIS — K5732 Diverticulitis of large intestine without perforation or abscess without bleeding: Secondary | ICD-10-CM

## 2022-06-16 DIAGNOSIS — R1032 Left lower quadrant pain: Secondary | ICD-10-CM | POA: Diagnosis not present

## 2022-06-16 MED ORDER — DICYCLOMINE HCL 10 MG PO CAPS: 10.0000 mg | ORAL_CAPSULE | Freq: Three times a day (TID) | ORAL | 3 refills | Status: AC | PRN

## 2022-06-16 MED ORDER — AMOXICILLIN-POT CLAVULANATE 875-125 MG PO TABS: 1.0000 | ORAL_TABLET | Freq: Two times a day (BID) | ORAL | 0 refills | Status: AC

## 2022-06-16 MED ORDER — POLYETHYLENE GLYCOL 3350 17 G PO PACK: 17.0000 g | PACK | Freq: Every day | ORAL | 0 refills | Status: AC | PRN

## 2022-06-16 NOTE — Progress Notes (Signed)
HPI :  51 year old male with a history of diverticulitis, here for follow-up visit for the same.  I last saw him for surveillance colonoscopy in December 2022.  He had no polyps noted but multiple diverticula in his transverse and left colon.  Recall he had his first episode of diverticulitis back in February 2017, acute sigmoid diverticulitis diagnosed on CT scan.  Generally since that first episode, he thinks he has had 3 or 4 total episodes of diverticulitis where she has received antibiotics, the last episode was a few years ago for which she received care.  He states perhaps once precordial he will have a few days of mild left lower quadrant pain that resolves on its own and he does not seek evaluation for it.  More recently his symptoms have been more severe.  This past Saturday/Sunday, he developed more severe left lower quadrant to lower abdominal pain that comes and goes.  He states his stools are "thin".  Denies any fevers.  No nausea or vomiting, he is eating well.  No blood in his stools.  His symptoms have fluctuated, last night he felt a bit better but then this morning felt much worse with more severe pain and missed work.  He has responded to antibiotics in the past.    Colonoscopy 06/13/21: The perianal and digital rectal examinations were normal. - Many medium-mouthed diverticula were found in the transverse colon and left colon. - Internal hemorrhoids were found during retroflexion. The hemorrhoids were small. - The exam was otherwise without abnormality.  Repeat exam in 10 years   CT abdomen / pelvis 08/23/2015 IMPRESSION: Findings compatible with acute sigmoid diverticulitis. No evidence for perforation. Recommend correlation with colonoscopy if not previously performed upon resolution of the acute symptomatology.   Fat containing right inguinal hernia.  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   Obesity    Thyroid disease    Transaminitis    hx mild, thought fibrate related - w/u ~2008 neg HbsAg, Hep C, ANA, nl iron/ferritin, ceruloplasmin, a1-antitrypsin, normal [place]     Past Surgical History:  Procedure Laterality Date   COLONOSCOPY  10/2015   one polyp, diverticulosis, rpt 5 yrs ([facility])   CRANIOTOMY N/A 11/05/2021   Procedure: 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;  Surgeon: Judith Part, MD;  Location: [place];  Service: Neurosurgery;  Laterality: N/A;   GANGLION CYST EXCISION Right 2008    wrist   PLACEMENT OF LUMBAR DRAIN N/A 11/05/2021   Procedure: PLACEMENT OF LUMBAR DRAIN;  Surgeon: Judith Part, MD;  Location: [place];  Service: Neurosurgery;  Laterality: N/A;   TRANSPHENOIDAL APPROACH EXPOSURE N/A 11/05/2021   Procedure: TRANSPHENOIDAL APPROACH EXPOSURE;  Surgeon: Jerrell Belfast, MD;  Location: [place];  Service: ENT;  Laterality: N/A;   Family History  Problem Relation Age of Onset   Hypertension Mother    Hyperlipidemia Father        TG   Diverticulitis Father    Diabetes Maternal Grandmother    Colon polyps Maternal Grandfather    Coronary artery disease Paternal Grandfather 98  unhealthy   Breast cancer Other 58       great grandmother, maternal   Stroke Neg Hx    Colon cancer Neg Hx    Esophageal cancer Neg Hx    Rectal cancer Neg Hx    Stomach cancer Neg Hx    Adrenal disorder Neg Hx    Social History   Tobacco Use   Smoking status: Never   Smokeless tobacco: Never  Vaping Use   Vaping Use: Never used  Substance Use Topics   Alcohol use: Not Currently    Comment: Rare   Drug use: No   Current Outpatient Medications  Medication Sig Dispense Refill   ALPRAZolam (XANAX) 0.5 MG  tablet Take 1 tablet (0.5 mg total) by mouth at bedtime as needed for anxiety. (Patient taking differently: Take 0.5 mg by mouth daily as needed for anxiety.) 15 tablet 0   amoxicillin-clavulanate (AUGMENTIN) 875-125 MG tablet Take 1 tablet by mouth every 12 (twelve) hours for 5 days. 10 tablet 0   Cholecalciferol (VITAMIN D) 125 MCG (5000 UT) CAPS Take 5,000 Units by mouth daily at 6 (six) AM.     dicyclomine (BENTYL) 10 MG capsule Take 1 capsule (10 mg total) by mouth every 8 (eight) hours as needed for spasms. 30 capsule 3   fluticasone (FLONASE) 50 MCG/ACT nasal spray Place 2 sprays into both nostrils daily as needed for allergies or rhinitis.     Ginger, Zingiber officinalis, (GINGER PO) Take 550 mg by mouth 2 (two) times daily.     GNP GARLIC EXTRACT PO Take 235 mg by mouth 2 (two) times daily.     levothyroxine (SYNTHROID) 50 MCG tablet Take 1 tablet (50 mcg total) by mouth daily. 90 tablet 3   Multiple Vitamin (MULTIVITAMIN PO) Take 1 tablet by mouth every 7 (seven) days.     Omega-3 Fatty Acids (FISH OIL PO) Take 1 capsule by mouth at bedtime.     polyethylene glycol (MIRALAX) 17 g packet Take 17 g by mouth daily as needed. 14 each 0   Testosterone (ANDROGEL) 20.25 MG/1.25GM (1.62%) GEL Place 81 mg onto the skin daily in the afternoon. 150 g 5   TURMERIC PO Take 1 capsule by mouth daily.     No current facility-administered medications for this visit.   No Known Allergies   Review of Systems: All systems reviewed and negative except where noted in HPI.   Lab Results  Component Value Date   WBC 10.3 11/05/2021   HGB 13.6 11/05/2021   HCT 39.9 11/05/2021   MCV 84.0 11/05/2021   PLT 237 11/05/2021    Lab Results  Component Value Date   CREATININE 0.92 04/29/2022   BUN 13 04/29/2022   NA 139 04/29/2022   K 4.1 04/29/2022   CL 103 04/29/2022   CO2 26 04/29/2022    Lab Results  Component Value Date   ALT 14 01/02/2022   AST 16 01/02/2022   ALKPHOS 79 01/02/2022    BILITOT 0.2 01/02/2022     Physical Exam: BP (!) 140/80 (BP Location: Left Arm, Patient Position: Sitting, Cuff Size: Normal)   Pulse 93   Ht '5\' 11"'$  (1.803 m)   Wt 233 lb 3.2 oz (105.8 kg)   SpO2 98%   BMI 32.52 kg/m  Constitutional: Pleasant,well-developed, male in no acute distress. Abdominal: Soft, nondistended, some LLQ TTP. There are no masses palpable.  Extremities: no edema Lymphadenopathy: No cervical adenopathy noted. Neurological: Alert and oriented to person place and  time. Skin: Skin is warm and dry. No rashes noted. Psychiatric: Normal mood and affect. Behavior is normal.   ASSESSMENT: 51 y.o. male here for assessment of the following  1. Diverticulitis of colon   2. LLQ pain    History of recurrent diverticulitis in the past now with similar symptoms consistent with diverticulitis episodes, persisting over the past 3 to 4 days.  He is got a bit of left lower quadrant tenderness on exam but otherwise nontoxic and vital stable.  Given his persistent symptoms, clinically consistent with diverticulitis, recommended course of antibiotics.  Discussed options and will give him some Augmentin for 5 days.  At the same time we will also give him some Bentyl to use as needed for abdominal cramping.  He should take MiraLAX daily during this time to keep his stools loose given he is slightly constipated recently.  Hopefully he resolves with these interventions, however if he has worsening pain, fever etc. he should contact me or seek care in the ED for imaging.  Otherwise we discussed long-term course of diverticulitis, he had a few episodes now and may likely have episodes in the future.  If he has recurrent episodes with increasing frequency he may want to consider surgical evaluation.  His father has had this surgery and understands what it would entail, he will contact me if he is interested in pursuing that at some point time.  His colonoscopy is otherwise up-to-date.   PLAN: -  Augmentin '875mg'$  BID for 5 days - Bentyl '10mg'$  every 8 hours PRN #30 RF3 - Miralax daily to keep stools soft, prevent constipation - discussed options in general for diverticulitis. If he continues to have recurrence or need Abx frequently consider surgical evaluation. Colonoscopy UTD  Jolly Mango, MD [facility]

## 2022-06-16 NOTE — Patient Instructions (Signed)
If you are age 51 or older, your body mass index should be between 23-30. Your Body mass index is 32.52 kg/m. If this is out of the aforementioned range listed, please consider follow up with your Primary Care Provider.  If you are age 27 or younger, your body mass index should be between 19-25. Your Body mass index is 32.52 kg/m. If this is out of the aformentioned range listed, please consider follow up with your Primary Care Provider.   ________________________________________________________  We have sent the following medications to your pharmacy for you to pick up at your convenience: Augmentin '875mg'$ : Take every 12 hours for 5 days Bentyl 10 mg: Take every 8 hours as needed  Please purchase the following medications over the counter and take as directed: Miralax- Take daily  Thank you for entrusting me with your care and for choosing [employer] [facility], Dr. Killona Cellar

## 2022-06-16 NOTE — Telephone Encounter (Signed)
Patient has not been seen in the office since 06/2021 for recall colonoscopy. Pt will need appt for evaluation and recommendations. Dr. Havery Moros had a cancellation for this afternoon at 3:40 pm and I can use per Dr. Havery Moros. Called and spoke with patient and informed him that he will need OV for recommendations. Pt is able to make appt this afternoon at 3:40 pm. Pt advised to check in on the 3rd floor. Pt verbalized understanding and had no concerns at the end of the call.

## 2022-06-16 NOTE — Telephone Encounter (Signed)
Inbound call from patient stating that he has been having a diverticulitis flare up since  Sunday and symptoms are worse this morning. Patient is requesting a call back to discuss and to see if he can get antibiotics to help. Please advise.

## 2022-06-19 ENCOUNTER — Other Ambulatory Visit: Payer: Self-pay | Admitting: Medical

## 2022-06-19 MED ORDER — ALPRAZOLAM 0.5 MG PO TABS: 0.5000 mg | ORAL_TABLET | Freq: Two times a day (BID) | ORAL | 0 refills | Status: DC | PRN

## 2022-06-19 NOTE — Addendum Note (Signed)
Addended by: Anabel Halon on: 06/19/2022 04:40 PM   Modules accepted: Orders

## 2022-07-08 ENCOUNTER — Other Ambulatory Visit (HOSPITAL_BASED_OUTPATIENT_CLINIC_OR_DEPARTMENT_OTHER): Payer: Self-pay | Admitting: Neurological Surgery

## 2022-07-08 DIAGNOSIS — D352 Benign neoplasm of pituitary gland: Secondary | ICD-10-CM

## 2022-07-10 ENCOUNTER — Ambulatory Visit (HOSPITAL_BASED_OUTPATIENT_CLINIC_OR_DEPARTMENT_OTHER)
Admission: RE | Admit: 2022-07-10 | Discharge: 2022-07-10 | Disposition: A | Discharge status: Home / Self Care | Payer: BC Managed Care – PPO | Source: Ambulatory Visit | Attending: Neurological Surgery | Admitting: Neurological Surgery

## 2022-07-10 DIAGNOSIS — D352 Benign neoplasm of pituitary gland: Secondary | ICD-10-CM | POA: Insufficient documentation

## 2022-07-10 MED ORDER — GADOBUTROL 1 MMOL/ML IV SOLN
10.0000 mL | Freq: Once | INTRAVENOUS | Status: AC | PRN
  Administered 2022-07-10: 10 mL via INTRAVENOUS
  Filled 2022-07-10: qty 10

## 2022-07-11 ENCOUNTER — Ambulatory Visit (HOSPITAL_BASED_OUTPATIENT_CLINIC_OR_DEPARTMENT_OTHER): Payer: BC Managed Care – PPO

## 2022-08-06 ENCOUNTER — Telehealth (HOSPITAL_BASED_OUTPATIENT_CLINIC_OR_DEPARTMENT_OTHER): Payer: Self-pay

## 2022-08-12 ENCOUNTER — Ambulatory Visit (INDEPENDENT_AMBULATORY_CARE_PROVIDER_SITE_OTHER): Payer: BC Managed Care – PPO | Admitting: Medical

## 2022-08-12 VITALS — BP 110/78 | HR 78 | Temp 98.4°F | Resp 18 | Ht 71.0 in | Wt 235.4 lb

## 2022-08-12 DIAGNOSIS — R5383 Other fatigue: Secondary | ICD-10-CM | POA: Diagnosis not present

## 2022-08-12 DIAGNOSIS — Z0000 Encounter for general adult medical examination without abnormal findings: Secondary | ICD-10-CM | POA: Diagnosis not present

## 2022-08-12 DIAGNOSIS — E230 Hypopituitarism: Secondary | ICD-10-CM | POA: Diagnosis not present

## 2022-08-12 DIAGNOSIS — Z125 Encounter for screening for malignant neoplasm of prostate: Secondary | ICD-10-CM | POA: Diagnosis not present

## 2022-08-12 LAB — COMPREHENSIVE METABOLIC PANEL
ALT: 14 U/L (ref 0–53)
AST: 14 U/L (ref 0–37)
Albumin: 4.6 g/dL (ref 3.5–5.2)
Alkaline Phosphatase: 67 U/L (ref 39–117)
BUN: 13 mg/dL (ref 6–23)
CO2: 28 mEq/L (ref 19–32)
Calcium: 9.4 mg/dL (ref 8.4–10.5)
Chloride: 104 mEq/L (ref 96–112)
Creatinine, Ser: 0.94 mg/dL (ref 0.40–1.50)
GFR: 94.1 mL/min (ref >60.00)
Glucose, Bld: 97 mg/dL (ref 70–99)
Potassium: 4.2 mEq/L (ref 3.5–5.1)
Sodium: 140 mEq/L (ref 135–145)
Total Bilirubin: 0.5 mg/dL (ref 0.2–1.2)
Total Protein: 7.1 g/dL (ref 6.0–8.3)

## 2022-08-12 LAB — LIPID PANEL
Cholesterol: 236 mg/dL — ABNORMAL HIGH (ref 0–200)
HDL: 32.4 mg/dL — ABNORMAL LOW (ref >39.00)
NonHDL: 203.68
Total CHOL/HDL Ratio: 7
Triglycerides: 399 mg/dL — ABNORMAL HIGH (ref 0.0–149.0)
VLDL: 79.8 mg/dL — ABNORMAL HIGH (ref 0.0–40.0)

## 2022-08-12 LAB — CBC WITH DIFFERENTIAL/PLATELET
Basophils Absolute: 0 10*3/uL (ref 0.0–0.1)
Basophils Relative: 0.8 % (ref 0.0–3.0)
Eosinophils Absolute: 0.1 10*3/uL (ref 0.0–0.7)
Eosinophils Relative: 2.8 % (ref 0.0–5.0)
HCT: 43.7 % (ref 39.0–52.0)
Hemoglobin: 14.8 g/dL (ref 13.0–17.0)
Lymphocytes Relative: 29.6 % (ref 12.0–46.0)
Lymphs Abs: 1.5 10*3/uL (ref 0.7–4.0)
MCHC: 33.8 g/dL (ref 30.0–36.0)
MCV: 81.3 fl (ref 78.0–100.0)
Monocytes Absolute: 0.4 10*3/uL (ref 0.1–1.0)
Monocytes Relative: 7.1 % (ref 3.0–12.0)
Neutro Abs: 3 10*3/uL (ref 1.4–7.7)
Neutrophils Relative %: 59.7 % (ref 43.0–77.0)
Platelets: 246 10*3/uL (ref 150.0–400.0)
RBC: 5.37 Mil/uL (ref 4.22–5.81)
RDW: 15.8 % — ABNORMAL HIGH (ref 11.5–15.5)
WBC: 5 10*3/uL (ref 4.0–10.5)

## 2022-08-12 LAB — PSA: PSA: 1.97 ng/mL (ref 0.10–4.00)

## 2022-08-12 LAB — VITAMIN D 25 HYDROXY (VIT D DEFICIENCY, FRACTURES): VITD: 60.86 ng/mL (ref 30.00–100.00)

## 2022-08-12 LAB — LDL CHOLESTEROL, DIRECT: Direct LDL: 126 mg/dL

## 2022-08-12 NOTE — Progress Notes (Signed)
Subjective:    Patient ID: Erik Cole, male    DOB: [birth year], 52 y.o.   MRN: [id number]  HPI  Pt in for cpe/wellness exam. He is fasting.  Pt is here for wellness exam.   Pt is fasting,   Pt has not been exercising. Pt states good diet. Cutting out carbs/sugars 1-2 cups coffee a day. No alcohol. Not smoking.   Pt has not had shingles vaccines. Declines presently.  Td- pt declined today.  Declined flu vaccine.  Colonoscopy Nov 2022.      Review of Systems  Constitutional:  Positive for fatigue. Negative for chills and fever.       Mild fatigue.  HENT:  Negative for congestion, ear discharge, ear pain, sinus pressure and sneezing.   Respiratory:  Negative for chest tightness, shortness of breath and wheezing.   Cardiovascular:  Negative for chest pain and palpitations.  Gastrointestinal:  Negative for abdominal pain and blood in stool.  Endocrine:       Pt has hot flash sensation.   Genitourinary:  Negative for dysuria, flank pain and frequency.  Musculoskeletal:  Negative for back pain.  Skin:  Negative for rash.  Neurological:  Negative for dizziness, seizures, numbness and headaches.  Psychiatric/Behavioral:  Negative for behavioral problems and confusion.     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   Obesity    Thyroid disease    Transaminitis    hx mild, thought fibrate related - w/u ~2008 neg HbsAg, Hep C, ANA, nl iron/ferritin, ceruloplasmin, a1-antitrypsin, normal [place]     Social History   Socioeconomic History   Marital status: Married    Spouse name: Not on file   Number of children: 2   Years of education: college   Highest education level: Not on file  Occupational  History   Occupation: Personal assistant: [employer]    Comment: [employer]  Tobacco Use   Smoking status: Never   Smokeless tobacco: Never  Vaping Use   Vaping Use: Never used  Substance and Sexual Activity   Alcohol use: Not Currently    Comment: Rare   Drug use: No   Sexual activity: Not on file  Other Topics Concern   Not on file  Social History Narrative   Caffeine: occasionally.Lives with wife [name]), 2 children (son and daughter), 1 catOccupation: Activity: no regular exerciseDiet: good water, daily fruits/vegetables, red meat rarely, fish 1x/wk, cut out fast food      Writes with left hand and does things with his right hand.      Lives in a two story home    Social Determinants of Health   Financial Resource Strain: Not on file  Food Insecurity: Not on file  Transportation Needs: Not on file  Physical Activity: Not on file  Stress: Not on file  Social Connections: Not on file  Intimate Partner Violence: Not on file    Past Surgical History:  Procedure Laterality Date   COLONOSCOPY  10/2015   one polyp, diverticulosis, rpt 5 yrs ([facility])   CRANIOTOMY N/A 11/05/2021   Procedure: Endonasal endoscopic resection of pituitary tumor with lumbar drain  placement;  Surgeon: Judith Part, MD;  Location: [facility];  Service: Neurosurgery;  Laterality: N/A;   GANGLION CYST EXCISION Right 2008    wrist   PLACEMENT OF LUMBAR DRAIN N/A 11/05/2021   Procedure: PLACEMENT OF LUMBAR DRAIN;  Surgeon: Judith Part, MD;  Location: [facility];  Service: Neurosurgery;  Laterality: N/A;   TRANSPHENOIDAL APPROACH EXPOSURE N/A 11/05/2021   Procedure: TRANSPHENOIDAL APPROACH EXPOSURE;  Surgeon: Jerrell Belfast, MD;  Location: [facility];  Service: ENT;  Laterality: N/A;    Family History  Problem Relation Age of Onset   Hypertension Mother    Hyperlipidemia Father        TG   Diverticulitis Father    Diabetes Maternal Grandmother    Colon polyps Maternal Grandfather     Coronary artery disease Paternal Grandfather 87       unhealthy   Breast cancer Other 58       great grandmother, maternal   Stroke Neg Hx    Colon cancer Neg Hx    Esophageal cancer Neg Hx    Rectal cancer Neg Hx    Stomach cancer Neg Hx    Adrenal disorder Neg Hx     No Known Allergies  Current Outpatient Medications on File Prior to Visit  Medication Sig Dispense Refill   ALPRAZolam (XANAX) 0.5 MG tablet Take 1 tablet (0.5 mg total) by mouth at bedtime as needed for anxiety. (Patient taking differently: Take 0.5 mg by mouth daily as needed for anxiety.) 15 tablet 0   ALPRAZolam (XANAX) 0.5 MG tablet Take 1 tablet (0.5 mg total) by mouth 2 (two) times daily as needed for anxiety. 10 tablet 0   Cholecalciferol (VITAMIN D) 125 MCG (5000 UT) CAPS Take 5,000 Units by mouth daily at 6 (six) AM.     dicyclomine (BENTYL) 10 MG capsule Take 1 capsule (10 mg total) by mouth every 8 (eight) hours as needed for spasms. 30 capsule 3   fluticasone (FLONASE) 50 MCG/ACT nasal spray Place 2 sprays into both nostrils daily as needed for allergies or rhinitis.     Ginger, Zingiber officinalis, (GINGER PO) Take 550 mg by mouth 2 (two) times daily.     GNP GARLIC EXTRACT PO Take 431 mg by mouth 2 (two) times daily.     levothyroxine (SYNTHROID) 50 MCG tablet Take 1 tablet (50 mcg total) by mouth daily. 90 tablet 3   Multiple Vitamin (MULTIVITAMIN PO) Take 1 tablet by mouth every 7 (seven) days.     Omega-3 Fatty Acids (FISH OIL PO) Take 1 capsule by mouth at bedtime.     polyethylene glycol (MIRALAX) 17 g packet Take 17 g by mouth daily as needed. 14 each 0   Testosterone (ANDROGEL) 20.25 MG/1.25GM (1.62%) GEL Place 81 mg onto the skin daily in the afternoon. 150 g 5   No current facility-administered medications on file prior to visit.    BP 110/78 (BP Location: Left Arm, Patient Position: Sitting, Cuff Size: Large)   Pulse 78   Temp 98.4 F (36.9 C) (Oral)   Resp 18   Ht '5\' 11"'$  (1.803 m)   Wt  235 lb 6.4 oz (106.8 kg)   SpO2 97%   BMI 32.83 kg/m        Objective:   Physical Exam  General Mental Status- Alert. General Appearance- Not in acute distress.   Skin General: Color- Normal Color. Moisture- Normal Moisture.  Neck Carotid Arteries- Normal color. Moisture- Normal Moisture. No carotid bruits.  No JVD.  Chest and Lung Exam Auscultation: Breath Sounds:-Normal.  Cardiovascular Auscultation:Rythm- Regular. Murmurs & Other Heart Sounds:Auscultation of the heart reveals- No Murmurs.  Abdomen Inspection:-Inspeection Normal. Palpation/Percussion:Note:No mass. Palpation and Percussion of the abdomen reveal- Non Tender, Non Distended + BS, no rebound or guarding.  Neurologic Cranial Nerve exam:- CN III-XII intact(No nystagmus), symmetric smile. Strength:- 5/5 equal and symmetric strength both upper and lower extremities.   Skin - scattered small moles on back and anterior thorax.    Assessment & Plan:   Patient Instructions  For you wellness exam today I have ordered cbc, cmp and lipid panel.  Offered shingrix vaccine and tdap vaccine. Declined today. Can get done later as nurse visit.  Recommend exercise and healthy diet.  We will let you know lab results as they come in.  Follow up date appointment will be determined after lab review.    Vit D added to labs today.  Hot flash sensation intermittently. Since having pituitary surgery. Advise to folllow up endocrinologist. Not sure approach on that. But would ask to check your temp to make sure no fever.   Diverticulitis- hx. Currently controlled. If pain returns constant could try to see you quickly in office of by my chart.   Ask you get scheduled to see your dermatolgist for skin survery/cancer screen.       Mackie Pai, PA-C

## 2022-08-12 NOTE — Patient Instructions (Addendum)
For you wellness exam today I have ordered cbc, cmp and lipid panel. Psa lab as well.  Offered shingrix vaccine and tdap vaccine. Declined today. Can get done later as nurse visit.  Recommend exercise and healthy diet.  We will let you know lab results as they come in.  Follow up date appointment will be determined after lab review.    Vit D added to labs today.  Hot flash sensation intermittently. Since having pituitary surgery. Advise to folllow up endocrinologist. Not sure approach on that. But would ask to check your temp to make sure no fever.   Diverticulitis- hx. Currently controlled. If pain returns constant could try to see you quickly in office of by my chart.   Ask you get scheduled to see your dermatolgist for skin survery/cancer screen.  Preventive Care 42-68 Years Old, Male Preventive care refers to lifestyle choices and visits with your health care provider that can promote health and wellness. Preventive care visits are also called wellness exams. What can I expect for my preventive care visit? Counseling During your preventive care visit, your health care provider may ask about your: Medical history, including: Past medical problems. Family medical history. Current health, including: Emotional well-being. Home life and relationship well-being. Sexual activity. Lifestyle, including: Alcohol, nicotine or tobacco, and drug use. Access to firearms. Diet, exercise, and sleep habits. Safety issues such as seatbelt and bike helmet use. Sunscreen use. Work and work Statistician. Physical exam Your health care provider will check your: Height and weight. These may be used to calculate your BMI (body mass index). BMI is a measurement that tells if you are at a healthy weight. Waist circumference. This measures the distance around your waistline. This measurement also tells if you are at a healthy weight and may help predict your risk of certain diseases, such as type 2  diabetes and high blood pressure. Heart rate and blood pressure. Body temperature. Skin for abnormal spots. What immunizations do I need?  Vaccines are usually given at various ages, according to a schedule. Your health care provider will recommend vaccines for you based on your age, medical history, and lifestyle or other factors, such as travel or where you work. What tests do I need? Screening Your health care provider may recommend screening tests for certain conditions. This may include: Lipid and cholesterol levels. Diabetes screening. This is done by checking your blood sugar (glucose) after you have not eaten for a while (fasting). Hepatitis B test. Hepatitis C test. HIV (human immunodeficiency virus) test. STI (sexually transmitted infection) testing, if you are at risk. Lung cancer screening. Prostate cancer screening. Colorectal cancer screening. Talk with your health care provider about your test results, treatment options, and if necessary, the need for more tests. Follow these instructions at home: Eating and drinking  Eat a diet that includes fresh fruits and vegetables, whole grains, lean protein, and low-fat dairy products. Take vitamin and mineral supplements as recommended by your health care provider. Do not drink alcohol if your health care provider tells you not to drink. If you drink alcohol: Limit how much you have to 0-2 drinks a day. Know how much alcohol is in your drink. In the [place] one drink equals one 12 oz bottle of beer (355 mL), one 5 oz glass of wine (148 mL), or one 1 oz glass of hard liquor (44 mL). Lifestyle Brush your teeth every morning and night with fluoride toothpaste. Floss one time each day. Exercise for at least 30 minutes  5 or more days each week. Do not use any products that contain nicotine or tobacco. These products include cigarettes, chewing tobacco, and vaping devices, such as e-cigarettes. If you need help quitting, ask your  health care provider. Do not use drugs. If you are sexually active, practice safe sex. Use a condom or other form of protection to prevent STIs. Take aspirin only as told by your health care provider. Make sure that you understand how much to take and what form to take. Work with your health care provider to find out whether it is safe and beneficial for you to take aspirin daily. Find healthy ways to manage stress, such as: Meditation, yoga, or listening to music. Journaling. Talking to a trusted person. Spending time with friends and family. Minimize exposure to UV radiation to reduce your risk of skin cancer. Safety Always wear your seat belt while driving or riding in a vehicle. Do not drive: If you have been drinking alcohol. Do not ride with someone who has been drinking. When you are tired or distracted. While texting. If you have been using any mind-altering substances or drugs. Wear a helmet and other protective equipment during sports activities. If you have firearms in your house, make sure you follow all gun safety procedures. What's next? Go to your health care provider once a year for an annual wellness visit. Ask your health care provider how often you should have your eyes and teeth checked. Stay up to date on all vaccines. This information is not intended to replace advice given to you by your health care provider. Make sure you discuss any questions you have with your health care provider. Document Revised: 12/25/2020 Document Reviewed: 12/25/2020 Elsevier Patient Education  [place].

## 2022-08-13 MED ORDER — FENOFIBRATE 48 MG PO TABS: 48.0000 mg | ORAL_TABLET | Freq: Every day | ORAL | 3 refills | Status: DC

## 2022-08-13 NOTE — Addendum Note (Signed)
Addended by: Anabel Halon on: 08/13/2022 05:37 PM   Modules accepted: Orders

## 2022-09-04 ENCOUNTER — Telehealth: Payer: Self-pay

## 2022-09-04 NOTE — Telephone Encounter (Signed)
Faxed request received for testosterone.

## 2022-09-07 ENCOUNTER — Other Ambulatory Visit: Payer: Self-pay

## 2022-09-07 ENCOUNTER — Other Ambulatory Visit: Payer: Self-pay | Admitting: Internal Medicine

## 2022-09-07 MED ORDER — TESTOSTERONE 20.25 MG/1.25GM (1.62%) TD GEL: 81.0000 mg | Freq: Every day | TRANSDERMAL | 5 refills | Status: DC

## 2022-09-09 ENCOUNTER — Encounter: Payer: Self-pay | Admitting: Internal Medicine

## 2022-09-09 ENCOUNTER — Ambulatory Visit: Payer: BC Managed Care – PPO | Admitting: Internal Medicine

## 2022-09-09 VITALS — BP 112/70 | HR 74 | Ht 71.0 in | Wt 235.0 lb

## 2022-09-09 DIAGNOSIS — E893 Postprocedural hypopituitarism: Secondary | ICD-10-CM | POA: Diagnosis not present

## 2022-09-09 DIAGNOSIS — E230 Hypopituitarism: Secondary | ICD-10-CM

## 2022-09-09 DIAGNOSIS — E2749 Other adrenocortical insufficiency: Secondary | ICD-10-CM | POA: Diagnosis not present

## 2022-09-09 DIAGNOSIS — E038 Other specified hypothyroidism: Secondary | ICD-10-CM

## 2022-09-09 NOTE — Progress Notes (Signed)
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 He has followed with [facility] since 08/11/2021 for consultative assistance with management of his pituitary macroadenoma.   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  He is S/P transsphenoidal pituitary resection with Dr. Zada Finders 11/05/2021  Postoperative MRI showed surgical changes   Preoperatively the patient had low serum cortisol at 1 UG/DL with a low normal ACTH at 13 PG/mL, low LH 0.41 mIU/mL , low normal FSH, normal prolactin as well as TSH but low free T4 at 0.34 NG/DL.  Patient also has been noted with undetectable testosterone  He was started on LT-for replacement, and prednisone by his previous endocrinologist   We discontinued hydrocortisone 05/14/2022 with an ACTH of 29 PG/mL,  He was started on topical testosterone therapy 12/2021  SUBJECTIVE:    Today (09/09/2022):  Erik Cole is here for adrenal insufficiency and hypothyroidism.   He is S/P transsphenoidal pituitary resection in April 2023  Weight has been stable  Energy stable   Denies joint aches or pains Denies constipation or diarrhea  Denies headaches  Denies visual changes  Continues with occasional hot flashes on average once daily  No changes in hair growth  He has been out of testosterone for the past week  NO HC since 05/2022  Medication Levothyroxine 50 mcg daily Testosterone 2 pump to each shoulder      HISTORY:  Past Medical  History:  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   Obesity    Thyroid disease    Transaminitis    hx mild, thought fibrate related - w/u ~2008 neg HbsAg, Hep C, ANA, nl iron/ferritin, ceruloplasmin, a1-antitrypsin, normal [place]   Past Surgical History:  Past Surgical History:  Procedure Laterality Date   COLONOSCOPY  10/2015   one polyp, diverticulosis, rpt 5 yrs ([facility])   CRANIOTOMY N/A 11/05/2021   Procedure: 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;  Surgeon: Judith Part, MD;  Location: [facility];  Service: Neurosurgery;  Laterality: N/A;   GANGLION CYST EXCISION Right 2008    wrist   PLACEMENT OF LUMBAR DRAIN N/A 11/05/2021   Procedure: PLACEMENT OF LUMBAR DRAIN;  Surgeon: Judith Part, MD;  Location: [facility];  Service: Neurosurgery;  Laterality: N/A;   TRANSPHENOIDAL APPROACH EXPOSURE N/A 11/05/2021   Procedure: TRANSPHENOIDAL APPROACH EXPOSURE;  Surgeon: Jerrell Belfast, MD;  Location: [facility];  Service: ENT;  Laterality: N/A;   Social History:  reports that he has never smoked. He has never used smokeless tobacco. He reports that he does not currently use alcohol. He reports that he does not use drugs. Family History:  Family  History  Problem Relation Age of Onset   Hypertension Mother    Hyperlipidemia Father        TG   Diverticulitis Father    Diabetes Maternal Grandmother    Colon polyps Maternal Grandfather    Coronary artery disease Paternal Grandfather 31       unhealthy   Breast cancer Other 78       great grandmother, maternal   Stroke Neg Hx    Colon cancer Neg Hx    Esophageal cancer Neg Hx    Rectal cancer Neg Hx    Stomach cancer Neg Hx     Adrenal disorder Neg Hx      HOME MEDICATIONS: Allergies as of 09/09/2022   No Known Allergies      Medication List        Accurate as of September 09, 2022  8:08 AM. If you have any questions, ask your nurse or doctor.          STOP taking these medications    MULTIVITAMIN PO Stopped by: Dorita Sciara, MD       TAKE these medications    ALPRAZolam 0.5 MG tablet Commonly known as: Xanax Take 1 tablet (0.5 mg total) by mouth 2 (two) times daily as needed for anxiety. What changed: Another medication with the same name was removed. Continue taking this medication, and follow the directions you see here. Changed by: Dorita Sciara, MD   dicyclomine 10 MG capsule Commonly known as: BENTYL Take 1 capsule (10 mg total) by mouth every 8 (eight) hours as needed for spasms.   fenofibrate 48 MG tablet Commonly known as: Tricor Take 1 tablet (48 mg total) by mouth daily.   FISH OIL PO Take 1 capsule by mouth at bedtime.   fluticasone 50 MCG/ACT nasal spray Commonly known as: FLONASE Place 2 sprays into both nostrils daily as needed for allergies or rhinitis.   GINGER PO Take 550 mg by mouth 2 (two) times daily.   GNP GARLIC EXTRACT PO Take 0000000 mg by mouth 2 (two) times daily.   levothyroxine 50 MCG tablet Commonly known as: SYNTHROID Take 1 tablet (50 mcg total) by mouth daily.   polyethylene glycol 17 g packet Commonly known as: MiraLax Take 17 g by mouth daily as needed.   Testosterone 20.25 MG/1.25GM (1.62%) Gel Commonly known as: AndroGel Place 81 mg onto the skin daily in the afternoon.   Vitamin D 125 MCG (5000 UT) Caps Take 5,000 Units by mouth daily at 6 (six) AM.          OBJECTIVE:   PHYSICAL EXAM: VS: BP 112/70 (BP Location: Left Arm, Patient Position: Sitting, Cuff Size: Large)   Pulse 74   Ht '5\' 11"'$  (1.803 m)   Wt 235 lb (106.6 kg)   SpO2 96%   BMI 32.78 kg/m    Body surface area is 2.31 meters  squared.    EXAM: General: Pt appears well and is in NAD  Eyes: External eye exam normal   Lungs: Clear with good BS bilat with no rales, rhonchi, or wheezes  Heart: Auscultation: RRR.  Abdomen: Soft, nontender, without masses or organomegaly palpable  Extremities:  BL LE: No pretibial edema normal ROM and strength.  Mental Status: Judgment, insight: Intact Orientation: Oriented to time, place, and person Mood and affect: No depression, anxiety, or agitation     DATA REVIEWED: 11/21/2021  Testosterone 9 ng/dL  FT4 0.83 ng/dL TSH 1.680 uIU/mL  LH 0.8  mIU/mL  FSH 1.5 ACTH 34.5 pg/mL  IGF-1 169 ng/mL  AM cortisol 10.6 ug/dL    MRI Brain 07/10/2022 Brain: Chronic tumoral expansion of the sella with resolution of postoperative blood products. Residual enhancing tissue mainly in the right aspect of the sella, contiguous with the rightward deflected infundibulum, likely normal pituitary. 3 x 4 mm cystic space in the left sella without mass effect or invasive features. No residual enhancing tumor seen in the left sella. Downward positioning of the chiasm after tumor decompression, no tumor mass effect on the chiasm.   No infarct, hemorrhage, hydrocephalus, or collection.   Vascular: Major flow voids and vascular enhancements are preserved   Skull and upper cervical spine: Normal with marrow signal.   Sinuses/Orbits: Negative   IMPRESSION: Resolution of postoperative blood products, only a small cystic space remains in the left para median sella. No tumoral mass effect on adjacent structures.  ASSESSMENT / PLAN / RECOMMENDATIONS:   Hx pituitary macroadenoma, S/P transphenoidal pituitary resection 10/2021  - He is S/P transsphenoidal pituitary resection in April, 2023 - MRI 06/2022 showed resolution of post-op blood products    2.  Secondary adrenal insufficiency  -We discontinued hydrocortisone 05/14/2022 with a serum cortisol of 9.7 UG/DL and ACTH 29 PG/mL -  Unable to perform cosyntropin stimulation test today, pt will return for a nurse visit    3.  Secondary hypothyroidism  -Patient is clinically euthyroid -His most recent free T4 is normal -No changes   Medication Continue levothyroxine 50 mcg daily     4.  Hypogonadotropic hypogonadism:   -He was on oral  testosterone replacement therapy  through [facility] without benefit -His fasting testosterone was low at 177.51 NG/DL 04/2022 -He was started on topical testosterone replacement therapy 12/2021 - He has been out for the past week  - Pt will return for labs   Follow-up in 6 months    Signed electronically by: Mack Guise, MD  [facility]  [address] [place], [place] [address] Phone: [phone] FAX: [phone]      CC: Elise Benne [id number] [facility] [address] [place] [address] Phone: [phone]  Fax: [phone]   Return to Endocrinology clinic as below: No future appointments.

## 2022-09-10 ENCOUNTER — Encounter: Payer: Self-pay | Admitting: Internal Medicine

## 2022-09-11 ENCOUNTER — Ambulatory Visit (INDEPENDENT_AMBULATORY_CARE_PROVIDER_SITE_OTHER): Payer: BC Managed Care – PPO

## 2022-09-11 ENCOUNTER — Other Ambulatory Visit (INDEPENDENT_AMBULATORY_CARE_PROVIDER_SITE_OTHER): Payer: BC Managed Care – PPO

## 2022-09-11 VITALS — BP 112/70

## 2022-09-11 DIAGNOSIS — E893 Postprocedural hypopituitarism: Secondary | ICD-10-CM | POA: Diagnosis not present

## 2022-09-11 DIAGNOSIS — E2749 Other adrenocortical insufficiency: Secondary | ICD-10-CM | POA: Diagnosis not present

## 2022-09-11 LAB — CORTISOL
Cortisol, Plasma: 10.3 ug/dL
Cortisol, Plasma: 18.7 ug/dL
Cortisol, Plasma: 19.6 ug/dL

## 2022-09-11 LAB — COMPREHENSIVE METABOLIC PANEL
ALT: 18 U/L (ref 0–53)
AST: 18 U/L (ref 0–37)
Albumin: 4.6 g/dL (ref 3.5–5.2)
Alkaline Phosphatase: 71 U/L (ref 39–117)
BUN: 21 mg/dL (ref 6–23)
CO2: 29 mEq/L (ref 19–32)
Calcium: 9.9 mg/dL (ref 8.4–10.5)
Chloride: 101 mEq/L (ref 96–112)
Creatinine, Ser: 0.9 mg/dL (ref 0.40–1.50)
GFR: 99.08 mL/min (ref >60.00)
Glucose, Bld: 96 mg/dL (ref 70–99)
Potassium: 3.9 mEq/L (ref 3.5–5.1)
Sodium: 138 mEq/L (ref 135–145)
Total Bilirubin: 0.5 mg/dL (ref 0.2–1.2)
Total Protein: 7.6 g/dL (ref 6.0–8.3)

## 2022-09-11 LAB — T4, FREE: Free T4: 0.81 ng/dL (ref 0.60–1.60)

## 2022-09-11 LAB — TSH: TSH: 1.12 u[IU]/mL (ref 0.35–5.50)

## 2022-09-11 MED ORDER — COSYNTROPIN 0.25 MG IJ SOLR
0.2500 mg | Freq: Once | INTRAMUSCULAR | Status: AC
  Administered 2022-09-11: 0.25 mg via INTRAVENOUS

## 2022-09-11 NOTE — Progress Notes (Signed)
After obtaining consent, and per orders of Dr. Kelton Pillar, injection of Cosyntropin 1ML given by Sandrine Bloodsworth L Aviyah Swetz in right arm . Patient instructed to remain in clinic for 20 minutes afterwards, and to report any adverse reaction to me immediately.

## 2022-10-26 ENCOUNTER — Encounter: Payer: Self-pay | Admitting: *Deleted

## 2022-10-27 ENCOUNTER — Other Ambulatory Visit: Payer: Self-pay | Admitting: Medical

## 2022-10-27 MED ORDER — ALPRAZOLAM 0.5 MG PO TABS: 0.5000 mg | ORAL_TABLET | Freq: Two times a day (BID) | ORAL | 0 refills | Status: DC | PRN

## 2022-10-27 NOTE — Telephone Encounter (Signed)
Requesting: xanax Contract: n/a UDS:n/a Last Visit:08/12/21 Next Visit:n/a Last Refill:06/19/22  Please Advise

## 2022-10-27 NOTE — Telephone Encounter (Signed)
10 tab rx xanax sent to pharmacy. Sporadic use. 10 tab in 3 month period approximately.

## 2022-11-23 ENCOUNTER — Other Ambulatory Visit: Payer: Self-pay

## 2022-11-23 ENCOUNTER — Encounter: Payer: Self-pay | Admitting: Gastroenterology

## 2022-11-23 MED ORDER — AMOXICILLIN-POT CLAVULANATE 875-125 MG PO TABS: 1.0000 | ORAL_TABLET | Freq: Two times a day (BID) | ORAL | 0 refills | Status: AC

## 2022-11-23 NOTE — Progress Notes (Signed)
Per Dr. Adela Lank Augmentin 875mg  BID for 5 days sent to pharmacy

## 2023-01-19 ENCOUNTER — Encounter: Payer: Self-pay | Admitting: Medical

## 2023-01-19 ENCOUNTER — Other Ambulatory Visit: Payer: Self-pay | Admitting: Medical

## 2023-01-19 MED ORDER — ALPRAZOLAM 0.5 MG PO TABS: 0.5000 mg | ORAL_TABLET | Freq: Two times a day (BID) | ORAL | 0 refills | Status: DC | PRN

## 2023-01-19 MED ORDER — VENLAFAXINE HCL ER 75 MG PO CP24: 75.0000 mg | ORAL_CAPSULE | Freq: Every day | ORAL | 0 refills | Status: DC

## 2023-01-19 NOTE — Telephone Encounter (Signed)
Requesting: alprazolam 0.5mg   Contract: None UDS:10/08/2017 Last Visit: 08/12/22 Next Visit: None Last Refill: 10/27/22 #10 and 0RF   Please Advise

## 2023-01-19 NOTE — Addendum Note (Signed)
Addended by: Gwenevere Abbot on: 01/19/2023 07:56 PM   Modules accepted: Orders

## 2023-01-19 NOTE — Telephone Encounter (Signed)
Only thing I see is Xanax

## 2023-01-19 NOTE — Telephone Encounter (Signed)
Attempted to send 5 tab rx refill but imprivata app failure. Will try to send again.

## 2023-01-27 ENCOUNTER — Encounter: Payer: Self-pay | Admitting: Gastroenterology

## 2023-01-27 MED ORDER — AMOXICILLIN-POT CLAVULANATE 875-125 MG PO TABS: 1.0000 | ORAL_TABLET | Freq: Two times a day (BID) | ORAL | 0 refills | Status: AC

## 2023-01-27 NOTE — Addendum Note (Signed)
Addended by: Missy Sabins on: 01/27/2023 12:16 PM   Modules accepted: Orders

## 2023-02-17 ENCOUNTER — Ambulatory Visit: Payer: BC Managed Care – PPO | Admitting: Medical

## 2023-02-17 VITALS — BP 117/84 | HR 70 | Resp 18 | Ht 71.0 in | Wt 222.4 lb

## 2023-02-17 DIAGNOSIS — F419 Anxiety disorder, unspecified: Secondary | ICD-10-CM | POA: Diagnosis not present

## 2023-02-17 NOTE — Patient Instructions (Addendum)
Anxiety Managed with lifestyle modifications and intermittent use of Alprazolam. Patient experienced adverse reaction to Effexor and prefers not to take daily medication. GAD-7 score is 5, indicating mild anxiety. -Continue Alprazolam 0.5mg  as needed for acute anxiety episodes. -Encourage continuation of lifestyle modifications including exercise, healthy diet, adequate sleep, and avoidance of excessive news/social media consumption.  Hx of diverticulitis no obvious flare symptoms but upset stomach with anxiety. Will follow and see if you need antibiotic.   General Health Maintenance -Continue monitoring anxiety levels and frequency of Alprazolam use. If frequency increases, consider implementing a contract and urine drug screen.   Follow up in 3-4 months or sooner if needed

## 2023-02-17 NOTE — Progress Notes (Signed)
   Subjective:    Patient ID: Erik Cole, male    DOB: 1970/07/18, 52 y.o.   MRN: [id number]  HPI Discussed the use of AI scribe software for clinical note transcription with the patient, who gave verbal consent to proceed.  History of Present Illness   The patient, with a history of anxiety, presents for a follow-up visit. He reports a recent increase in anxiety symptoms, characterized by heightened sensitivity to stimuli and a persistent knot in his stomach. This chronic low-level anxiety is a shift from the panic attacks he experienced in 2020. The patient has been managing his anxiety through self-care practices, including exercise, dietary changes, and adequate sleep. He also takes L-theanine supplements and has reduced his sugar intake.  The patient has been prescribed Effexor in the past, but after taking one dose recently, he experienced an uncomfortable sensation described as "icy hot" within his blood. This side effect led him to discontinue the medication, opting to manage his anxiety without it. He has also been prescribed Alprazolam, which he uses sparingly for acute events such as flying or giving presentations at work. However, he reports that his anxiety has been improving over the past few weeks, and he has been able to manage it effectively without daily medication. He expresses a preference for managing his anxiety on his own, rather than relying on daily medication.        Review of Systems See hpi.    Objective:   Physical Exam  General Mental Status- Alert. General Appearance- Not in acute distress.   Skin General: Color- Normal Color. Moisture- Normal Moisture.  Neck Carotid Arteries- Normal color. Moisture- Normal Moisture. No carotid bruits. No JVD.  Chest and Lung Exam Auscultation: Breath Sounds:-Normal.  Cardiovascular Auscultation:Rythm- Regular. Murmurs & Other Heart Sounds:Auscultation of the heart reveals- No  Murmurs.  Abdomen Inspection:-Inspeection Normal. Palpation/Percussion:Note:No mass. Palpation and Percussion of the abdomen reveal- Non Tender, Non Distended + BS, no rebound or guarding.    Neurologic Cranial Nerve exam:- CN III-XII intact(No nystagmus), symmetric smile. Strength:- 5/5 equal and symmetric strength both upper and lower extremities.       Assessment & Plan:   Assessment and Plan    Anxiety Managed with lifestyle modifications and intermittent use of Alprazolam. Patient experienced adverse reaction to Effexor and prefers not to take daily medication. GAD-7 score is 5, indicating mild anxiety. -Continue Alprazolam 0.5mg  as needed for acute anxiety episodes. -Encourage continuation of lifestyle modifications including exercise, healthy diet, adequate sleep, and avoidance of excessive news/social media consumption.  Hx of diverticulitis no obvious flare symptoms but upset stomach with anxiety. Will follow and see if you need antibiotic.   General Health Maintenance -Continue monitoring anxiety levels and frequency of Alprazolam use. If frequency increases, consider implementing a contract and urine drug screen.   Follow up in 3-4 months or sooner if needed  [employer], PA-C

## 2023-03-17 ENCOUNTER — Other Ambulatory Visit (HOSPITAL_COMMUNITY): Payer: Self-pay

## 2023-03-17 ENCOUNTER — Ambulatory Visit: Payer: BC Managed Care – PPO | Admitting: Internal Medicine

## 2023-03-17 ENCOUNTER — Encounter: Payer: Self-pay | Admitting: Internal Medicine

## 2023-03-17 ENCOUNTER — Telehealth: Payer: Self-pay

## 2023-03-17 VITALS — BP 126/84 | HR 76 | Ht 71.0 in | Wt 219.0 lb

## 2023-03-17 DIAGNOSIS — E038 Other specified hypothyroidism: Secondary | ICD-10-CM | POA: Diagnosis not present

## 2023-03-17 DIAGNOSIS — E893 Postprocedural hypopituitarism: Secondary | ICD-10-CM

## 2023-03-17 DIAGNOSIS — E230 Hypopituitarism: Secondary | ICD-10-CM

## 2023-03-17 LAB — LUTEINIZING HORMONE: LH: 1.15 m[IU]/mL — ABNORMAL LOW (ref 1.50–9.30)

## 2023-03-17 LAB — COMPREHENSIVE METABOLIC PANEL
ALT: 17 U/L (ref 0–53)
AST: 16 U/L (ref 0–37)
Albumin: 4.5 g/dL (ref 3.5–5.2)
Alkaline Phosphatase: 77 U/L (ref 39–117)
BUN: 19 mg/dL (ref 6–23)
CO2: 27 meq/L (ref 19–32)
Calcium: 9.7 mg/dL (ref 8.4–10.5)
Chloride: 102 meq/L (ref 96–112)
Creatinine, Ser: 0.84 mg/dL (ref 0.40–1.50)
GFR: 100.8 mL/min (ref >60.00)
Glucose, Bld: 93 mg/dL (ref 70–99)
Potassium: 4.1 meq/L (ref 3.5–5.1)
Sodium: 138 meq/L (ref 135–145)
Total Bilirubin: 0.5 mg/dL (ref 0.2–1.2)
Total Protein: 7.4 g/dL (ref 6.0–8.3)

## 2023-03-17 LAB — TSH: TSH: 1.12 u[IU]/mL (ref 0.35–5.50)

## 2023-03-17 LAB — T4, FREE: Free T4: 1.03 ng/dL (ref 0.60–1.60)

## 2023-03-17 MED ORDER — TESTOSTERONE 20.25 MG/1.25GM (1.62%) TD GEL: 81.0000 mg | Freq: Every day | TRANSDERMAL | 5 refills | Status: DC

## 2023-03-17 MED ORDER — LEVOTHYROXINE SODIUM 50 MCG PO TABS: 50.0000 ug | ORAL_TABLET | Freq: Every day | ORAL | 3 refills | Status: DC

## 2023-03-17 NOTE — Progress Notes (Signed)
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 He has followed with [facility] since 08/11/2021 for consultative assistance with management of his pituitary macroadenoma.   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  He is S/P transsphenoidal pituitary resection with Dr. Maurice Small 11/05/2021  Postoperative MRI showed surgical changes   Preoperatively the patient had low serum cortisol at 1 UG/DL with a low normal ACTH at 13 PG/mL, low LH 0.41 mIU/mL , low normal FSH, normal prolactin as well as TSH but low free T4 at 0.34 NG/DL.  Patient also has been noted with undetectable testosterone  He was started on LT-for replacement, and prednisone by his previous endocrinologist   We discontinued hydrocortisone 05/14/2022 with an ACTH of 29 PG/mL, cosyntropin stimulation test was normal 09/2022  He was started on topical testosterone therapy 12/2021  SUBJECTIVE:    Today (03/17/2023):  Erik Cole is here for adrenal insufficiency and hypothyroidism.   He is S/P transsphenoidal pituitary resection in April 2023  Weight has been trending down  Denies local neck swelling  Energy  is good    Has noted diarrhea last week that he attributes to foods [name] Denies headaches  Denies visual changes  Hot flashes  have been improving  Denies ED  Has noted stress and anxiety , has not started effexor   Medication Levothyroxine 50 mcg daily Testosterone 2 pump to each shoulder       HISTORY:  Past Medical History:  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   Obesity    Thyroid disease    Transaminitis    hx mild, thought fibrate related - w/u ~2008 neg HbsAg, Hep C, ANA, nl iron/ferritin, ceruloplasmin, a1-antitrypsin, normal [place]   Past Surgical History:  Past Surgical History:  Procedure Laterality Date   COLONOSCOPY  10/2015   one polyp, diverticulosis, rpt 5 yrs ([facility])   CRANIOTOMY N/A 11/05/2021   Procedure: 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;  Surgeon: Jadene Pierini, MD;  Location: [facility] OR;  Service: Neurosurgery;  Laterality: N/A;   GANGLION CYST EXCISION Right 2008    wrist   PLACEMENT OF LUMBAR DRAIN N/A 11/05/2021   Procedure: PLACEMENT OF LUMBAR DRAIN;  Surgeon: Jadene Pierini, MD;  Location: [facility] OR;  Service: Neurosurgery;  Laterality: N/A;   TRANSPHENOIDAL APPROACH EXPOSURE N/A 11/05/2021   Procedure: TRANSPHENOIDAL APPROACH EXPOSURE;  Surgeon: Osborn Coho, MD;  [facility];  Service: ENT;  Laterality: N/A;   Social History:  reports that he has never smoked. He has never used smokeless tobacco. He reports that he does not currently use alcohol. He reports that he does not  use drugs. Family History:  Family History  Problem Relation Age of Onset   Hypertension Mother    Hyperlipidemia Father        TG   Diverticulitis Father    Diabetes Maternal Grandmother    Colon polyps Maternal Grandfather    Coronary artery disease Paternal Grandfather 73       unhealthy   Breast cancer Other 41       great grandmother, maternal   Stroke Neg Hx    Colon cancer Neg Hx    Esophageal cancer Neg Hx    Rectal cancer  Neg Hx    Stomach cancer Neg Hx    Adrenal disorder Neg Hx      HOME MEDICATIONS: Allergies as of 03/17/2023   No Known Allergies      Medication List        Accurate as of March 17, 2023  6:56 AM. If you have any questions, ask your nurse or doctor.          ALPRAZolam 0.5 MG tablet Commonly known as: Xanax Take 1 tablet (0.5 mg total) by mouth 2 (two) times daily as needed for anxiety.   dicyclomine 10 MG capsule Commonly known as: BENTYL Take 1 capsule (10 mg total) by mouth every 8 (eight) hours as needed for spasms.   fenofibrate 48 MG tablet Commonly known as: Tricor Take 1 tablet (48 mg total) by mouth daily.   FISH OIL PO Take 1 capsule by mouth at bedtime.   fluticasone 50 MCG/ACT nasal spray Commonly known as: FLONASE Place 2 sprays into both nostrils daily as needed for allergies or rhinitis.   GINGER PO Take 550 mg by mouth 2 (two) times daily.   GNP GARLIC EXTRACT PO Take 600 mg by mouth 2 (two) times daily.   levothyroxine 50 MCG tablet Commonly known as: SYNTHROID Take 1 tablet (50 mcg total) by mouth daily.   polyethylene glycol 17 g packet Commonly known as: MiraLax Take 17 g by mouth daily as needed.   Testosterone 20.25 MG/1.25GM (1.62%) Gel Commonly known as: AndroGel Place 81 mg onto the skin daily in the afternoon.   venlafaxine XR 75 MG 24 hr capsule Commonly known as: Effexor XR Take 1 capsule (75 mg total) by mouth daily with breakfast.   Vitamin D 125 MCG (5000 UT) Caps Take 5,000 Units by mouth daily at 6 (six) AM.          OBJECTIVE:   PHYSICAL EXAM: VS: There were no vitals taken for this visit.   There is no height or weight on file to calculate BSA.    EXAM: General: Pt appears well and is in NAD  Eyes: External eye exam normal   Lungs: Clear with good BS bilat with no rales, rhonchi, or wheezes  Heart: Auscultation: RRR.  Abdomen: Soft, nontender, without masses or organomegaly palpable   Extremities:  BL LE: No pretibial edema normal ROM and strength.  Mental Status: Judgment, insight: Intact Orientation: Oriented to time, place, and person Mood and affect: No depression, anxiety, or agitation     DATA REVIEWED:   Latest Reference Range & Units 03/17/23 09:00  Sodium 135 - 145 mEq/L 138  Potassium 3.5 - 5.1 mEq/L 4.1  Chloride 96 - 112 mEq/L 102  CO2 19 - 32 mEq/L 27  Glucose 70 - 99 mg/dL 93  BUN 6 - 23 mg/dL 19  Creatinine 4.40 - 1.02 mg/dL 7.25  Calcium 8.4 - 36.6 mg/dL 9.7  Alkaline Phosphatase 39 - 117 U/L 77  Albumin 3.5 - 5.2 g/dL 4.5  AST 0 - 37 U/L 16  ALT 0 - 53 U/L 17  Total Protein 6.0 - 8.3 g/dL 7.4  Total Bilirubin 0.2 - 1.2 mg/dL 0.5  GFR >16.10 mL/min 100.80    Latest Reference Range & Units 03/17/23 09:00  LH 1.50 - 9.30 mIU/mL 1.15 (L)  Glucose 70 - 99 mg/dL 93  TSH 9.60 - 4.54 uIU/mL 1.12  T4,Free(Direct) 0.60 - 1.60 ng/dL 0.98  (L): Data is abnormally low    11/21/2021  Testosterone 9 ng/dL  FT4 1.19 ng/dL TSH 1.478 uIU/mL  LH 0.8 mIU/mL  FSH 1.5 ACTH 34.5 pg/mL  IGF-1 169 ng/mL  AM cortisol 10.6 ug/dL     Cosyntropin Stim test 09/11/2022  Latest Reference Range & Units 09/11/22 07:41 09/11/22 08:28 09/11/22 08:58  Cortisol, Plasma ug/dL 29.5 62.1 30.8       MRI Brain 07/10/2022 Brain: Chronic tumoral expansion of the sella with resolution of postoperative blood products. Residual enhancing tissue mainly in the right aspect of the sella, contiguous with the rightward deflected infundibulum, likely normal pituitary. 3 x 4 mm cystic space in the left sella without mass effect or invasive features. No residual enhancing tumor seen in the left sella. Downward positioning of the chiasm after tumor decompression, no tumor mass effect on the chiasm.   No infarct, hemorrhage, hydrocephalus, or collection.   Vascular: Major flow voids and vascular enhancements are preserved   Skull and upper cervical spine: Normal with  marrow signal.   Sinuses/Orbits: Negative   IMPRESSION: Resolution of postoperative blood products, only a small cystic space remains in the left para median sella. No tumoral mass effect on adjacent structures.  ASSESSMENT / PLAN / RECOMMENDATIONS:   Hx pituitary macroadenoma, S/P transphenoidal pituitary resection 10/2021  - He is S/P transsphenoidal pituitary resection in April, 2023 - MRI 06/2022 showed resolution of post-op blood products    2.  Secondary adrenal insufficiency  - Resolved  -We discontinued hydrocortisone 05/14/2022 with a serum cortisol of 9.7 UG/DL and ACTH 29 PG/mL -Cosyntropin stimulation test was normal 09/2022  3.  Secondary hypothyroidism  -Patient is clinically euthyroid -TFTs normal -No changes   Medication Continue levothyroxine 50 mcg daily     4.  Hypogonadotropic hypogonadism:   -He was on oral  testosterone replacement therapy  through [facility] without benefit -His fasting testosterone was low at 177.51 NG/DL 65/7846 -He was started on topical testosterone replacement therapy 12/2021 -LH remains low, prolactin testosterone pending on today's labs   Follow-up in 6 months    Signed electronically by: Lyndle Herrlich, MD  [facility]  [facility] [address] [place] [place], [place] [address] Phone: [phone] FAX: [phone]      CC: Erik Cole [address] [place] [address] Phone: [phone]  Fax: [phone]   Return to Endocrinology clinic as below: Future Appointments  Date Time Provider Department Center  03/17/2023  8:50 AM Batu Cassin, Konrad Dolores, MD [id number] None

## 2023-03-17 NOTE — Telephone Encounter (Signed)
Pharmacy Patient Advocate Encounter   Received notification from Pt Calls Messages that prior authorization for Testosterone is required/requested.   Insurance verification completed.   The patient is insured through [employer] .   Per test claim: PA required; PA submitted to [employer] via CoverMyMeds Key/confirmation #/EOC [id number] Status is pending

## 2023-03-17 NOTE — Telephone Encounter (Signed)
PA needed on Testosterone.

## 2023-03-17 NOTE — Addendum Note (Signed)
Addended by: Scarlette Shorts on: 03/17/2023 04:16 PM   Modules accepted: Orders

## 2023-03-20 LAB — TESTOSTERONE, TOTAL, LC/MS/MS: Testosterone, Total, LC-MS-MS: 195 ng/dL — ABNORMAL LOW (ref 250–1100)

## 2023-03-20 LAB — PROLACTIN: Prolactin: 5.8 ng/mL (ref 2.0–18.0)

## 2023-03-22 ENCOUNTER — Telehealth: Payer: Self-pay | Admitting: Internal Medicine

## 2023-03-22 ENCOUNTER — Other Ambulatory Visit (HOSPITAL_COMMUNITY): Payer: Self-pay

## 2023-03-22 MED ORDER — "NEEDLE (DISP) 25G X 5/8"" MISC": 1.0000 | 3 refills | Status: DC

## 2023-03-22 MED ORDER — TESTOSTERONE CYPIONATE 200 MG/ML IJ SOLN: 1.0000 mL | INTRAMUSCULAR | 1 refills | Status: DC

## 2023-03-22 MED ORDER — "SYRINGE/NEEDLE (DISP) 18G X 1"" 3 ML MISC": 1.0000 | 3 refills | Status: DC

## 2023-03-22 NOTE — Telephone Encounter (Signed)
Discussed continuously low LH and testosterone, patient is not responding to topical testosterone   I have offered him oral versus intramuscular   Patient interested in testosterone cypionate, he will take 100 mg (1 mL) every 14 days  Prescription sent  Patient will be scheduled for nurse visit for training on injection technique

## 2023-03-22 NOTE — Telephone Encounter (Signed)
Pharmacy Patient Advocate Encounter  Received notification from [employer] that Prior Authorization for Testosterone 20.25 MG/1.25GM(1.62%) gel has been APPROVED from 02-15-2023 to 03-16-2024. Ran test claim, Copay is $15.00 Insurance pays MAX 30 day supply (37.5g). This test claim was processed through [facility]- copay amounts may vary at other pharmacies due to pharmacy/plan contracts, or as the patient moves through the different stages of their insurance plan.   PA #/Case ID/Reference #: [id number]

## 2023-04-06 ENCOUNTER — Encounter: Payer: Self-pay | Admitting: Internal Medicine

## 2023-04-06 ENCOUNTER — Other Ambulatory Visit (HOSPITAL_COMMUNITY): Payer: Self-pay

## 2023-04-07 ENCOUNTER — Ambulatory Visit: Payer: BC Managed Care – PPO

## 2023-04-08 ENCOUNTER — Other Ambulatory Visit (HOSPITAL_COMMUNITY): Payer: Self-pay

## 2023-04-08 ENCOUNTER — Telehealth: Payer: Self-pay

## 2023-04-08 NOTE — Telephone Encounter (Signed)
Pharmacy Patient Advocate Encounter   Received notification from Patient Advice Request messages that prior authorization for Testosterone Cypionate 200MG /ML intramuscular solution is required/requested.  Per test claim: PA required; PA submitted to [employer] via CoverMyMeds Key/confirmation #/EOC [id number] Status is pending

## 2023-04-13 NOTE — Telephone Encounter (Signed)
Pharmacy Patient Advocate Encounter  Received notification from [employer] that Prior Authorization for Testosterone Cypionate 200MG /ML intramuscular solution has been APPROVED through 04/06/2024  PA #/Case ID/Reference #: [id number]

## 2023-04-15 ENCOUNTER — Other Ambulatory Visit: Payer: Self-pay | Admitting: Medical

## 2023-04-15 NOTE — Telephone Encounter (Signed)
Requesting: xanax Contract:  n/a UDS: na Last Visit:02/17/23 Next Visit:n/a Last Refill:01/19/23  Please Advise

## 2023-04-18 MED ORDER — ALPRAZOLAM 0.5 MG PO TABS: 0.5000 mg | ORAL_TABLET | Freq: Two times a day (BID) | ORAL | 0 refills | Status: DC | PRN

## 2023-04-18 NOTE — Telephone Encounter (Signed)
Sent in 5 tab xanax rx. Pt used med very sparingly. Ask him to schedule wellness exam in January. Will review anxiety and see if needs contract/uds. Currently very limited sporadic use for anxiety.

## 2023-05-05 ENCOUNTER — Ambulatory Visit (INDEPENDENT_AMBULATORY_CARE_PROVIDER_SITE_OTHER): Payer: BC Managed Care – PPO

## 2023-05-05 ENCOUNTER — Encounter: Payer: Self-pay | Admitting: Gastroenterology

## 2023-05-05 VITALS — BP 120/72 | HR 80 | Ht 71.0 in | Wt 219.0 lb

## 2023-05-05 DIAGNOSIS — R7989 Other specified abnormal findings of blood chemistry: Secondary | ICD-10-CM

## 2023-05-05 MED ORDER — TESTOSTERONE CYPIONATE 200 MG/ML IM SOLN
200.0000 mg | INTRAMUSCULAR | Status: DC
Start: 2023-05-05 — End: 2023-08-27
  Administered 2023-05-05: 200 mg via INTRAMUSCULAR

## 2023-05-05 NOTE — Telephone Encounter (Signed)
Patient last seen in office 06/2022 for recurrent diverticulitis.  Patient now c/o dull, achy LLQ abdominal pain overnight/early morning on Tuesday. States he felt fine throughout the remainder of the day. Again, had LLQ pain overnight/early morning on Wednesday morning. Thus far, has again felt fine today. Patient states he has had some nausea, no vomiting. Has felt an increase urge to have bowel movements, 1 normal bm, 1 slightly "ribbonlike."; No blood noted. Patient denies any fever or chills.

## 2023-05-05 NOTE — Progress Notes (Signed)
After obtaining consent, and per orders of Dr. Lonzo Cloud, injection of testosterone 1ml  given by Alila Sotero L Jamonte Curfman in anterior left thigh. Patient instructed to remain in clinic for 20 minutes afterwards, and to report any adverse reaction to me immediately.

## 2023-05-06 MED ORDER — AMOXICILLIN-POT CLAVULANATE 875-125 MG PO TABS: 1.0000 | ORAL_TABLET | Freq: Two times a day (BID) | ORAL | 0 refills | Status: DC

## 2023-05-06 NOTE — Telephone Encounter (Signed)
===  View-only below this line=== ----- Message ----- From: Benancio Deeds, MD Sent: 05/05/2023   4:44 PM EDT To: Richardson Chiquito, RN Subject: RE: Diverticulitis                             Okay to give a course of Augmentin for 5 days BID if he feels symptoms c/w diverticulitis. Thanks

## 2023-08-27 ENCOUNTER — Ambulatory Visit: Payer: BC Managed Care – PPO | Admitting: Medical

## 2023-08-27 ENCOUNTER — Encounter: Payer: Self-pay | Admitting: Internal Medicine

## 2023-08-27 ENCOUNTER — Encounter: Payer: Self-pay | Admitting: Medical

## 2023-08-27 ENCOUNTER — Ambulatory Visit: Payer: BC Managed Care – PPO | Admitting: Internal Medicine

## 2023-08-27 VITALS — BP 137/88 | HR 74 | Temp 98.0°F | Resp 18 | Ht 71.0 in | Wt 222.0 lb

## 2023-08-27 VITALS — BP 120/80 | HR 76 | Ht 71.0 in | Wt 222.0 lb

## 2023-08-27 DIAGNOSIS — Z872 Personal history of diseases of the skin and subcutaneous tissue: Secondary | ICD-10-CM

## 2023-08-27 DIAGNOSIS — Z0001 Encounter for general adult medical examination with abnormal findings: Secondary | ICD-10-CM | POA: Diagnosis not present

## 2023-08-27 DIAGNOSIS — R972 Elevated prostate specific antigen [PSA]: Secondary | ICD-10-CM

## 2023-08-27 DIAGNOSIS — E038 Other specified hypothyroidism: Secondary | ICD-10-CM | POA: Diagnosis not present

## 2023-08-27 DIAGNOSIS — D751 Secondary polycythemia: Secondary | ICD-10-CM

## 2023-08-27 DIAGNOSIS — M546 Pain in thoracic spine: Secondary | ICD-10-CM

## 2023-08-27 DIAGNOSIS — Z125 Encounter for screening for malignant neoplasm of prostate: Secondary | ICD-10-CM

## 2023-08-27 DIAGNOSIS — Z0000 Encounter for general adult medical examination without abnormal findings: Secondary | ICD-10-CM

## 2023-08-27 DIAGNOSIS — L299 Pruritus, unspecified: Secondary | ICD-10-CM

## 2023-08-27 DIAGNOSIS — E893 Postprocedural hypopituitarism: Secondary | ICD-10-CM

## 2023-08-27 DIAGNOSIS — R7989 Other specified abnormal findings of blood chemistry: Secondary | ICD-10-CM

## 2023-08-27 DIAGNOSIS — F419 Anxiety disorder, unspecified: Secondary | ICD-10-CM | POA: Diagnosis not present

## 2023-08-27 DIAGNOSIS — R030 Elevated blood-pressure reading, without diagnosis of hypertension: Secondary | ICD-10-CM | POA: Diagnosis not present

## 2023-08-27 DIAGNOSIS — E230 Hypopituitarism: Secondary | ICD-10-CM

## 2023-08-27 DIAGNOSIS — E785 Hyperlipidemia, unspecified: Secondary | ICD-10-CM

## 2023-08-27 LAB — COMPREHENSIVE METABOLIC PANEL
ALT: 14 U/L (ref 0–53)
AST: 15 U/L (ref 0–37)
Albumin: 4.6 g/dL (ref 3.5–5.2)
Alkaline Phosphatase: 72 U/L (ref 39–117)
BUN: 14 mg/dL (ref 6–23)
CO2: 28 meq/L (ref 19–32)
Calcium: 9.3 mg/dL (ref 8.4–10.5)
Chloride: 101 meq/L (ref 96–112)
Creatinine, Ser: 0.85 mg/dL (ref 0.40–1.50)
GFR: 100.13 mL/min (ref >60.00)
Glucose, Bld: 89 mg/dL (ref 70–99)
Potassium: 3.9 meq/L (ref 3.5–5.1)
Sodium: 138 meq/L (ref 135–145)
Total Bilirubin: 0.6 mg/dL (ref 0.2–1.2)
Total Protein: 7.4 g/dL (ref 6.0–8.3)

## 2023-08-27 LAB — CBC WITH DIFFERENTIAL/PLATELET
Basophils Absolute: 0.1 10*3/uL (ref 0.0–0.1)
Basophils Relative: 1 % (ref 0.0–3.0)
Eosinophils Absolute: 0.1 10*3/uL (ref 0.0–0.7)
Eosinophils Relative: 2.8 % (ref 0.0–5.0)
HCT: 51.8 % (ref 39.0–52.0)
Hemoglobin: 16.9 g/dL (ref 13.0–17.0)
Lymphocytes Relative: 26 % (ref 12.0–46.0)
Lymphs Abs: 1.3 10*3/uL (ref 0.7–4.0)
MCHC: 32.6 g/dL (ref 30.0–36.0)
MCV: 83.1 fL (ref 78.0–100.0)
Monocytes Absolute: 0.4 10*3/uL (ref 0.1–1.0)
Monocytes Relative: 7.9 % (ref 3.0–12.0)
Neutro Abs: 3.2 10*3/uL (ref 1.4–7.7)
Neutrophils Relative %: 62.3 % (ref 43.0–77.0)
Platelets: 252 10*3/uL (ref 150.0–400.0)
RBC: 6.23 Mil/uL — ABNORMAL HIGH (ref 4.22–5.81)
RDW: 15.4 % (ref 11.5–15.5)
WBC: 5.1 10*3/uL (ref 4.0–10.5)

## 2023-08-27 LAB — LIPID PANEL
Cholesterol: 272 mg/dL — ABNORMAL HIGH (ref 0–200)
HDL: 35.6 mg/dL — ABNORMAL LOW (ref >39.00)
LDL Cholesterol: 207 mg/dL — ABNORMAL HIGH (ref 0–99)
NonHDL: 236.52
Total CHOL/HDL Ratio: 8
Triglycerides: 146 mg/dL (ref 0.0–149.0)
VLDL: 29.2 mg/dL (ref 0.0–40.0)

## 2023-08-27 LAB — PSA: PSA: 2.45 ng/mL (ref 0.10–4.00)

## 2023-08-27 MED ORDER — LEVOTHYROXINE SODIUM 50 MCG PO TABS: 50.0000 ug | ORAL_TABLET | Freq: Every day | ORAL | 3 refills | Status: DC

## 2023-08-27 MED ORDER — TESTOSTERONE CYPIONATE 200 MG/ML IJ SOLN: 0.7500 mL | INTRAMUSCULAR | 5 refills | Status: DC

## 2023-08-27 MED ORDER — ALPRAZOLAM 0.5 MG PO TABS: 0.5000 mg | ORAL_TABLET | Freq: Two times a day (BID) | ORAL | 0 refills | Status: DC | PRN

## 2023-08-27 NOTE — Progress Notes (Signed)
Subjective:    Patient ID: Erik Cole, male    DOB: Jan 06, 1971, 53 y.o.   MRN: [id number]  HPI  Pt in for cpe/wellness exam. He is fasting.   Pt is here for wellness exam.   Pt is fasting,   Pt has been exercising. Pt states good diet. Cutting out carbs/sugars 1-2 cups coffee a day. No alcohol. Not smoking.  Pt has not had flu vaccine this year. Not getting done today. May get undecided.  Pt declines shingles vaccine.   Will do tdap other day. Pressed for time. Pt is overdue. Made aware.   Up to date on colonoscopy.    On review pt had psa. Pt thinks he has seen [facility] in the past. One value In past was very low?     Review of Systems  Constitutional:  Negative for chills, fatigue and fever.  HENT:  Negative for congestion and drooling.   Respiratory:  Negative for cough, chest tightness, shortness of breath and wheezing.   Cardiovascular:  Negative for chest pain and palpitations.  Gastrointestinal:  Negative for abdominal pain, blood in stool, diarrhea and vomiting.  Genitourinary:  Negative for dysuria and frequency.  Musculoskeletal:  Negative for back pain and myalgias.       Mid thoracic upper back pain recently. Over past 2 weeks. Some pain this morning when doing some curls.   Skin:  Negative for rash.       Mild itching recently all over. Pt thinks his skin is well moisturized. Presently on one day. No sob. No wheezing. No lip swelling.  Neurological:  Negative for dizziness, syncope, weakness and headaches.  Hematological:  Negative for adenopathy. Does not bruise/bleed easily.  Psychiatric/Behavioral:  Negative for behavioral problems and decreased concentration.        Some anxiety about his hct being elevated.    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   Obesity    Thyroid disease    Transaminitis    hx mild, thought fibrate related - w/u ~2008 neg HbsAg, Hep C, ANA, nl iron/ferritin, ceruloplasmin, a1-antitrypsin, normal [place]     Social History   Socioeconomic History   Marital status: Married    Spouse name: Not on file   Number of children: 2   Years of education: college   Highest education level: Bachelor's degree (e.g., BA, AB, BS)  Occupational History   Occupation: Dealer: OTHER    Comment: [employer]  Tobacco Use   Smoking status: Never   Smokeless tobacco: Never  Vaping Use   Vaping status: Never Used  Substance and Sexual Activity   Alcohol use: Not Currently    Comment: Rare   Drug use: No   Sexual activity: Not on file  Other Topics Concern   Not on file  Social History Narrative   Caffeine: occasionally.Lives with wife [name]), 2 children (son and daughter), 1 catOccupation: Activity: no regular exerciseDiet: good water, daily fruits/vegetables, red meat rarely, fish 1x/wk, cut out fast food      Writes with left hand and does things with his right hand.  Lives in a two story home    Social Drivers of Health   Financial Resource Strain: Low Risk  (08/20/2023)   Overall Financial Resource Strain (CARDIA)    Difficulty of Paying Living Expenses: Not hard at all  Food Insecurity: No Food Insecurity (08/20/2023)   Hunger Vital Sign    Worried About Running Out of Food in the Last Year: Never true    Ran Out of Food in the Last Year: Never true  Transportation Needs: No Transportation Needs (08/20/2023)   PRAPARE - Administrator, Civil Service (Medical): No    Lack of Transportation (Non-Medical): No  Physical Activity: Sufficiently Active (08/20/2023)   Exercise Vital Sign    Days of Exercise per Week: 6 days    Minutes of Exercise per Session: 30 min  Stress: No Stress Concern  Present (08/20/2023)   [employer] of Occupational Health - Occupational Stress Questionnaire    Feeling of Stress : Only a little  Social Connections: Moderately Integrated (08/20/2023)   Social Connection and Isolation Panel [NHANES]    Frequency of Communication with Friends and Family: Once a week    Frequency of Social Gatherings with Friends and Family: Once a week    Attends Religious Services: More than 4 times per year    Active Member of [employer] or Organizations: Yes    Attends Banker Meetings: More than 4 times per year    Marital Status: Married  Catering manager Violence: Not on file    Past Surgical History:  Procedure Laterality Date   COLONOSCOPY  10/2015   one polyp, diverticulosis, rpt 5 yrs ([facility])   CRANIOTOMY N/A 11/05/2021   Procedure: 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;  Surgeon: Jadene Pierini, MD;  Location: [facility] OR;  Service: Neurosurgery;  Laterality: N/A;   GANGLION CYST EXCISION Right 2008    wrist   PLACEMENT OF LUMBAR DRAIN N/A 11/05/2021   Procedure: PLACEMENT OF LUMBAR DRAIN;  Surgeon: Jadene Pierini, MD;  Location: [facility] OR;  Service: Neurosurgery;  Laterality: N/A;   TRANSPHENOIDAL APPROACH EXPOSURE N/A 11/05/2021   Procedure: TRANSPHENOIDAL APPROACH EXPOSURE;  Surgeon: Osborn Coho, MD;  Location: [facility] OR;  Service: ENT;  Laterality: N/A;    Family History  Problem Relation Age of Onset   Hypertension Mother    Hyperlipidemia Father        TG   Diverticulitis Father    Diabetes Maternal Grandmother    Colon polyps Maternal Grandfather    Coronary artery disease Paternal Grandfather 25       unhealthy   Breast cancer Other 58       great grandmother, maternal   Stroke Neg Hx    Colon cancer Neg Hx    Esophageal cancer Neg Hx    Rectal cancer Neg Hx    Stomach cancer Neg Hx    Adrenal disorder Neg Hx     No Known Allergies  Current Outpatient Medications on File Prior  to Visit  Medication Sig Dispense Refill   ALPRAZolam (XANAX) 0.5 MG tablet Take 1 tablet (0.5 mg total) by mouth 2 (two) times daily as needed for anxiety. 5 tablet 0   Cholecalciferol (VITAMIN D) 125 MCG (5000 UT) CAPS Take 5,000 Units by mouth daily at 6 (six) AM.     dicyclomine (BENTYL) 10 MG capsule Take 1 capsule (10 mg total) by mouth every 8 (eight) hours as needed for spasms. (Patient not taking: Reported  on 09/09/2022) 30 capsule 3   fenofibrate (TRICOR) 48 MG tablet Take 1 tablet (48 mg total) by mouth daily. (Patient not taking: Reported on 09/09/2022) 90 tablet 3   fluticasone (FLONASE) 50 MCG/ACT nasal spray Place 2 sprays into both nostrils daily as needed for allergies or rhinitis.     Ginger, Zingiber officinalis, (GINGER PO) Take 550 mg by mouth 2 (two) times daily.     GNP GARLIC EXTRACT PO Take 600 mg by mouth 2 (two) times daily.     levothyroxine (SYNTHROID) 50 MCG tablet Take 1 tablet (50 mcg total) by mouth daily. 90 tablet 3   NEEDLE, DISP, 25 G 25G X 5/8" MISC 1 Device by Does not apply route every 14 (fourteen) days. 10 each 3   Omega-3 Fatty Acids (FISH OIL PO) Take 1 capsule by mouth at bedtime.     polyethylene glycol (MIRALAX) 17 g packet Take 17 g by mouth daily as needed. 14 each 0   Syringe/Needle, Disp, 18G X 1" 3 ML MISC 1 Device by Does not apply route every 14 (fourteen) days. 10 each 3   Testosterone Cypionate 200 MG/ML SOLN Inject 1 mL as directed every 14 (fourteen) days. 12 mL 1   Current Facility-Administered Medications on File Prior to Visit  Medication Dose Route Frequency Provider Last Rate Last Admin   testosterone cypionate (DEPOTESTOSTERONE CYPIONATE) injection 200 mg  200 mg Intramuscular Q14 Days Shamleffer, Konrad Dolores, MD   200 mg at 05/05/23 0954    BP 137/88   Pulse 74   Temp 98 F (36.7 C)   Resp 18   Ht 5\' 11"  (1.803 m)   Wt 222 lb (100.7 kg)   SpO2 96%   BMI 30.96 kg/m           Objective:   Physical  Exam   General Mental Status- Alert. General Appearance- Not in acute distress.   Skin General: Color- Normal Color. Moisture- Normal Moisture.  Neck Carotid Arteries- Normal color. Moisture- Normal Moisture. No carotid bruits. No JVD.  Chest and Lung Exam Auscultation: Breath Sounds:-CTA  Cardiovascular Auscultation:Rythm- RRR Murmurs & Other Heart Sounds:Auscultation of the heart reveals- No Murmurs.  Abdomen Inspection:-Inspeection Normal. Palpation/Percussion:Note:No mass. Palpation and Percussion of the abdomen reveal- Non Tender, Non Distended + BS, no rebound or guarding.   Neurologic Cranial Nerve exam:- CN III-XII intact(No nystagmus), symmetric smile. Strength:- 5/5 equal and symmetric strength both upper and lower extremities.    Back- mid upper thoracic mild tender to palpation    Assessment & Plan:   Patient Instructions  For you wellness exam today I have ordered cbc, cmp, psa and  lipid panel.  Vaccine tdap deferred today. Can get later. Consider shingrix as well.  Recommend exercise and healthy diet.  We will let you know lab results as they come in.  Follow up date appointment will be determined after lab review.    For back pain thoracic placed future xray spine. You prefer to hold off presently on xray but do get if pain still persists.  Itching resolved. Let me know if itching returns.  Anxiety recent mild increase. Refilled 5 tab xanax rx.  Elevated bp. Check bp daily over next week and update me on readings.    Esperanza Richters, [place]   [id number] charge as addressed/advised on t spine back pain, itching, anxiety and elevated blood pressure.

## 2023-08-27 NOTE — Progress Notes (Signed)
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 He has followed with [facility] since 08/11/2021 for consultative assistance with management of his pituitary macroadenoma.   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  He is S/P transsphenoidal pituitary resection with Dr. Maurice Small 11/05/2021  Postoperative MRI showed surgical changes   Preoperatively the patient had low serum cortisol at 1 UG/DL with a low normal ACTH at 13 PG/mL, low LH 0.41 mIU/mL , low normal FSH, normal prolactin as well as TSH but low free T4 at 0.34 NG/DL.  Patient also has been noted with undetectable testosterone  He was started on LT-for replacement, and prednisone by his previous endocrinologist   We discontinued hydrocortisone 05/14/2022 with an ACTH of 29 PG/mL, cosyntropin stimulation test was normal 09/2022  He was started on topical testosterone therapy 12/2021, but this was switched to intramuscular by 04/2023  SUBJECTIVE:    Today (08/27/2023):  Erik Cole is here for adrenal insufficiency and hypothyroidism.   He is S/P transsphenoidal pituitary resection in April 2023  Weight has been stable  Denies local neck swelling  Energy  is good    Has occasional headaches  Denies visual changes  Denies ED    Last dose of Testosterone was 2 days ago   He saw his integrative provider in December, 2024 as below     Medication Levothyroxine 50 mcg daily Testosterone cypionate 200mg /mL,  1 mL every 14 days     HISTORY:  Past Medical History:  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   Obesity    Thyroid disease    Transaminitis    hx mild, thought fibrate related - w/u ~2008 neg HbsAg, Hep C, ANA, nl iron/ferritin, ceruloplasmin, a1-antitrypsin, normal [place]   Past Surgical History:  Past Surgical History:  Procedure Laterality Date   COLONOSCOPY  10/2015   one polyp, diverticulosis, rpt 5 yrs ([facility])   CRANIOTOMY N/A 11/05/2021   Procedure: 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;  Surgeon: Jadene Pierini, MD;  [facility];  Service: Neurosurgery;  Laterality: N/A;   GANGLION CYST EXCISION Right 2008    wrist   PLACEMENT OF LUMBAR DRAIN N/A 11/05/2021   Procedure: PLACEMENT OF LUMBAR DRAIN;  Surgeon: Jadene Pierini, MD;  Location: [facility] OR;  Service: Neurosurgery;  Laterality: N/A;   TRANSPHENOIDAL APPROACH EXPOSURE N/A 11/05/2021   Procedure: TRANSPHENOIDAL APPROACH EXPOSURE;  Surgeon: Osborn Coho, MD;  Location: [facility] OR;  Service: ENT;  Laterality: N/A;   Social History:  reports that he has never smoked. He has never used smokeless tobacco. He reports that he does not currently use alcohol. He  reports that he does not use drugs. Family History:  Family History  Problem Relation Age of Onset   Hypertension Mother    Hyperlipidemia Father        TG   Diverticulitis Father    Diabetes Maternal Grandmother    Colon polyps Maternal Grandfather    Coronary artery disease Paternal Grandfather 41       unhealthy   Breast cancer Other 19       great grandmother, maternal   Stroke Neg Hx    Colon cancer Neg Hx    Esophageal cancer Neg Hx     Rectal cancer Neg Hx    Stomach cancer Neg Hx    Adrenal disorder Neg Hx      HOME MEDICATIONS: Allergies as of 08/27/2023   No Known Allergies      Medication List        Accurate as of August 27, 2023 11:50 AM. If you have any questions, ask your nurse or doctor.          STOP taking these medications    amoxicillin-clavulanate 875-125 MG tablet Commonly known as: AUGMENTIN Stopped by: Esperanza Richters   GNP GARLIC EXTRACT PO Stopped by: Johnney Ou Ardys [place]       TAKE these medications    ALPRAZolam 0.5 MG tablet Commonly known as: Xanax Take 1 tablet (0.5 mg total) by mouth 2 (two) times daily as needed for anxiety.   dicyclomine 10 MG capsule Commonly known as: BENTYL Take 1 capsule (10 mg total) by mouth every 8 (eight) hours as needed for spasms.   fenofibrate 48 MG tablet Commonly known as: Tricor Take 1 tablet (48 mg total) by mouth daily.   FISH OIL PO Take 1 capsule by mouth at bedtime.   fluticasone 50 MCG/ACT nasal spray Commonly known as: FLONASE Place 2 sprays into both nostrils daily as needed for allergies or rhinitis.   GINGER PO Take 550 mg by mouth 2 (two) times daily.   levothyroxine 50 MCG tablet Commonly known as: SYNTHROID Take 1 tablet (50 mcg total) by mouth daily.   NEEDLE (DISP) 25 G 25G X 5/8" Misc 1 Device by Does not apply route every 14 (fourteen) days.   polyethylene glycol 17 g packet Commonly known as: MiraLax Take 17 g by mouth daily as needed.   Syringe/Needle (Disp) 18G X 1" 3 ML Misc 1 Device by Does not apply route every 14 (fourteen) days.   Testosterone Cypionate 200 MG/ML Soln Inject 1 mL as directed every 14 (fourteen) days.   Vitamin D 125 MCG (5000 UT) Caps Take 5,000 Units by mouth daily at 6 (six) AM.          OBJECTIVE:   PHYSICAL EXAM: VS: BP 120/80 (BP Location: Right Arm, Patient Position: Sitting, Cuff Size: Normal)   Pulse 76   Ht 5\' 11"  (1.803 m)   Wt 222 lb (100.7 kg)    SpO2 97%   BMI 30.96 kg/m    Body surface area is 2.25 meters squared.    EXAM: General: Pt appears well and is in NAD  Eyes: External eye exam normal   Lungs: Clear with good BS bilat   Heart: Auscultation: RRR.  Abdomen: Soft, nontender  Extremities:  BL LE: No pretibial edema  Mental Status: Judgment, insight: Intact Orientation: Oriented to time, place, and person Mood and affect: No depression, anxiety, or agitation     DATA REVIEWED:   07/13/2023  RBC 6.1 H/H 16.6/52.9 GFR  77 A1c 5.3% TSH 1.25 Estradiol 32.8 (7.6-42.6) Testosterone  475     Latest Reference Range & Units 03/17/23 09:00  Sodium 135 - 145 mEq/L 138  Potassium 3.5 - 5.1 mEq/L 4.1  Chloride 96 - 112 mEq/L 102  CO2 19 - 32 mEq/L 27  Glucose 70 - 99 mg/dL 93  BUN 6 - 23 mg/dL 19  Creatinine 8.29 - 5.62 mg/dL 1.30  Calcium 8.4 - 86.5 mg/dL 9.7  Alkaline Phosphatase 39 - 117 U/L 77  Albumin 3.5 - 5.2 g/dL 4.5  AST 0 - 37 U/L 16  ALT 0 - 53 U/L 17  Total Protein 6.0 - 8.3 g/dL 7.4  Total Bilirubin 0.2 - 1.2 mg/dL 0.5  GFR >78.46 mL/min 100.80    Latest Reference Range & Units 03/17/23 09:00  LH 1.50 - 9.30 mIU/mL 1.15 (L)  Glucose 70 - 99 mg/dL 93  TSH 9.62 - 9.52 uIU/mL 1.12  T4,Free(Direct) 0.60 - 1.60 ng/dL 8.41  (L): Data is abnormally low    11/21/2021  Testosterone 9 ng/dL  FT4 3.24 ng/dL TSH 4.010 uIU/mL  LH 0.8 mIU/mL  FSH 1.5 ACTH 34.5 pg/mL  IGF-1 169 ng/mL  AM cortisol 10.6 ug/dL     Cosyntropin Stim test 09/11/2022  Latest Reference Range & Units 09/11/22 07:41 09/11/22 08:28 09/11/22 08:58  Cortisol, Plasma ug/dL 27.2 53.6 64.4       MRI Brain 07/10/2022 Brain: Chronic tumoral expansion of the sella with resolution of postoperative blood products. Residual enhancing tissue mainly in the right aspect of the sella, contiguous with the rightward deflected infundibulum, likely normal pituitary. 3 x 4 mm cystic space in the left sella without mass effect or  invasive features. No residual enhancing tumor seen in the left sella. Downward positioning of the chiasm after tumor decompression, no tumor mass effect on the chiasm.   No infarct, hemorrhage, hydrocephalus, or collection.   Vascular: Major flow voids and vascular enhancements are preserved   Skull and upper cervical spine: Normal with marrow signal.   Sinuses/Orbits: Negative   IMPRESSION: Resolution of postoperative blood products, only a small cystic space remains in the left para median sella. No tumoral mass effect on adjacent structures.    Old records , labs and images have been reviewed.   ASSESSMENT / PLAN / RECOMMENDATIONS:   Hx pituitary macroadenoma, S/P transphenoidal pituitary resection 10/2021  - He is S/P transsphenoidal pituitary resection in April, 2023 - MRI 06/2022 showed resolution of post-op blood products    2.  Secondary hypothyroidism  -Patient is clinically euthyroid -TFTs normal -No changes   Medication Continue levothyroxine 50 mcg daily     3.  Hypogonadotropic hypogonadism:   -He was on oral  testosterone replacement therapy  through integrative health without benefit -His fasting testosterone was low at 177.51 NG/DL 09/4740 -He was started on topical testosterone replacement therapy 12/2021 -Patient was switched to intramuscular injection by 03/2023 - Will decrease testosterone as his levels were > 400 , 2 weeks after testosterone injection as well as elevated Hct - Will recheck in between injections, Printed [facility] orders provided   Medication  Decrease Testosterone cypionate 200 Mg/mL,  0.75 mL every 14 days    4. Erythrocytosis:  - This is due to testosterone therapy  - Will decrease testosterone and recheck in 3 months - Pt was provided with printed [facility] orders   Follow-up in 6 months   I spent 27 minutes preparing to see the patient by review of recent labs,  imaging and procedures, obtaining and reviewing  separately obtained history, communicating with the patient ordering medications, tests or procedures, and documenting clinical information in the EHR including the differential Dx, treatment, and any further evaluation and other management   Signed electronically by: Lyndle Herrlich, MD  [facility]  [facility] [address] [place] [place], [place] [address] Phone: [phone] FAX: [phone]      CC: Marisue Brooklyn [address] [place] [address] Phone: [phone]  Fax: [phone]   Return to Endocrinology clinic as below: No future appointments.

## 2023-08-27 NOTE — Patient Instructions (Signed)
Please have labs done at [facility] 7 days After the testosterone injection

## 2023-08-27 NOTE — Patient Instructions (Addendum)
For you wellness exam today I have ordered cbc, cmp, psa and  lipid panel.  Vaccine tdap deferred today. Can get later. Consider shingrix as well.  Recommend exercise and healthy diet.  We will let you know lab results as they come in.  Follow up date appointment will be determined after lab review.    For back pain thoracic placed future xray spine. You prefer to hold off presently on xray but do get if pain still persists.  Itching resolved. Let me know if itching returns.  Anxiety recent mild increase. Refilled 5 tab xanax rx.  Elevated bp. Check bp daily over next week and update me on readings.  Preventive Care 69-53 Years Old, Male Preventive care refers to lifestyle choices and visits with your health care provider that can promote health and wellness. Preventive care visits are also called wellness exams. What can I expect for my preventive care visit? Counseling During your preventive care visit, your health care provider may ask about your: Medical history, including: Past medical problems. Family medical history. Current health, including: Emotional well-being. Home life and relationship well-being. Sexual activity. Lifestyle, including: Alcohol, nicotine or tobacco, and drug use. Access to firearms. Diet, exercise, and sleep habits. Safety issues such as seatbelt and bike helmet use. Sunscreen use. Work and work Astronomer. Physical exam Your health care provider will check your: Height and weight. These may be used to calculate your BMI (body mass index). BMI is a measurement that tells if you are at a healthy weight. Waist circumference. This measures the distance around your waistline. This measurement also tells if you are at a healthy weight and may help predict your risk of certain diseases, such as type 2 diabetes and high blood pressure. Heart rate and blood pressure. Body temperature. Skin for abnormal spots. What immunizations do I need?  Vaccines  are usually given at various ages, according to a schedule. Your health care provider will recommend vaccines for you based on your age, medical history, and lifestyle or other factors, such as travel or where you work. What tests do I need? Screening Your health care provider may recommend screening tests for certain conditions. This may include: Lipid and cholesterol levels. Diabetes screening. This is done by checking your blood sugar (glucose) after you have not eaten for a while (fasting). Hepatitis B test. Hepatitis C test. HIV (human immunodeficiency virus) test. STI (sexually transmitted infection) testing, if you are at risk. Lung cancer screening. Prostate cancer screening. Colorectal cancer screening. Talk with your health care provider about your test results, treatment options, and if necessary, the need for more tests. Follow these instructions at home: Eating and drinking  Eat a diet that includes fresh fruits and vegetables, whole grains, lean protein, and low-fat dairy products. Take vitamin and mineral supplements as recommended by your health care provider. Do not drink alcohol if your health care provider tells you not to drink. If you drink alcohol: Limit how much you have to 0-2 drinks a day. Know how much alcohol is in your drink. In the [place] one drink equals one 12 oz bottle of beer (355 mL), one 5 oz glass of wine (148 mL), or one 1 oz glass of hard liquor (44 mL). Lifestyle Brush your teeth every morning and night with fluoride toothpaste. Floss one time each day. Exercise for at least 30 minutes 5 or more days each week. Do not use any products that contain nicotine or tobacco. These products include cigarettes, chewing tobacco, and  vaping devices, such as e-cigarettes. If you need help quitting, ask your health care provider. Do not use drugs. If you are sexually active, practice safe sex. Use a condom or other form of protection to prevent STIs. Take  aspirin only as told by your health care provider. Make sure that you understand how much to take and what form to take. Work with your health care provider to find out whether it is safe and beneficial for you to take aspirin daily. Find healthy ways to manage stress, such as: Meditation, yoga, or listening to music. Journaling. Talking to a trusted person. Spending time with friends and family. Minimize exposure to UV radiation to reduce your risk of skin cancer. Safety Always wear your seat belt while driving or riding in a vehicle. Do not drive: If you have been drinking alcohol. Do not ride with someone who has been drinking. When you are tired or distracted. While texting. If you have been using any mind-altering substances or drugs. Wear a helmet and other protective equipment during sports activities. If you have firearms in your house, make sure you follow all gun safety procedures. What's next? Go to your health care provider once a year for an annual wellness visit. Ask your health care provider how often you should have your eyes and teeth checked. Stay up to date on all vaccines. This information is not intended to replace advice given to you by your health care provider. Make sure you discuss any questions you have with your health care provider. Document Revised: 12/25/2020 Document Reviewed: 12/25/2020 Elsevier Patient Education  2024 [employer].

## 2023-08-28 ENCOUNTER — Encounter: Payer: Self-pay | Admitting: Medical

## 2023-08-28 NOTE — Addendum Note (Signed)
 Addended by: Gwenevere Abbot on: 08/28/2023 08:51 AM   Modules accepted: Orders

## 2023-09-01 NOTE — Addendum Note (Signed)
 Addended by: Gwenevere Abbot on: 09/01/2023 11:53 AM   Modules accepted: Orders

## 2023-09-17 ENCOUNTER — Ambulatory Visit: Payer: BC Managed Care – PPO | Admitting: Internal Medicine

## 2023-10-10 ENCOUNTER — Encounter: Payer: Self-pay | Admitting: Medical

## 2023-10-10 ENCOUNTER — Other Ambulatory Visit: Payer: Self-pay | Admitting: Medical

## 2023-10-11 MED ORDER — HYDROXYZINE HCL 10 MG PO TABS: ORAL_TABLET | ORAL | 0 refills | Status: AC

## 2023-10-11 MED ORDER — ALPRAZOLAM 0.5 MG PO TABS
0.5000 mg | ORAL_TABLET | Freq: Two times a day (BID) | ORAL | 0 refills | Status: DC | PRN
Start: 2023-10-11 — End: 2023-10-28

## 2023-10-11 NOTE — Addendum Note (Signed)
 Addended by: Gwenevere Abbot on: 10/11/2023 12:07 PM   Modules accepted: Orders

## 2023-10-11 NOTE — Telephone Encounter (Signed)
 Requesting: xanax Contract: n/a UDS: n/a Last Visit:08/27/23 Next Visit:n/a Last Refill:08/27/23  Please Advise

## 2023-10-15 ENCOUNTER — Ambulatory Visit: Admitting: Medical

## 2023-10-15 ENCOUNTER — Encounter: Payer: Self-pay | Admitting: Medical

## 2023-10-15 VITALS — BP 120/78 | HR 75 | Temp 97.6°F | Resp 18 | Ht 71.0 in | Wt 216.8 lb

## 2023-10-15 DIAGNOSIS — F419 Anxiety disorder, unspecified: Secondary | ICD-10-CM

## 2023-10-15 DIAGNOSIS — R030 Elevated blood-pressure reading, without diagnosis of hypertension: Secondary | ICD-10-CM | POA: Diagnosis not present

## 2023-10-15 DIAGNOSIS — R9431 Abnormal electrocardiogram [ECG] [EKG]: Secondary | ICD-10-CM

## 2023-10-15 DIAGNOSIS — R002 Palpitations: Secondary | ICD-10-CM

## 2023-10-15 LAB — TROPONIN I (HIGH SENSITIVITY): High Sens Troponin I: 3 ng/L (ref 2–17)

## 2023-10-15 MED ORDER — BUSPIRONE HCL 7.5 MG PO TABS
7.5000 mg | ORAL_TABLET | Freq: Two times a day (BID) | ORAL | 0 refills | Status: DC
Start: 2023-10-15 — End: 2023-11-11

## 2023-10-15 NOTE — Progress Notes (Signed)
 Subjective:    Patient ID: Erik Cole, male    DOB: Dec 05, 1970, 53 y.o.   MRN: [id number]  HPI  Alroy Portela is a 53 year old male who presents with palpitations and anxiety.  He experiences palpitations, particularly noticeable when lying down at night, feeling his heartbeat prominently. These episodes occur almost every other night and are often associated with anxiety. He thinks  blood pressure tends to be slightly elevated during these episodes, with readings around 120-130 mmHg, though he questions the accuracy of his wrist cuff measurements. A recent urologist visit recorded a higher reading of 140 mmHg, attributed to anxiety. No palpitations occur during physical exertion, such as climbing stairs, and they are absent during the day or with exercise.  He has a history of severe anxiety dating back to his thirties, during which he briefly took high blood pressure medication. Currently, he manages his anxiety with Xanax, used sparingly during work-related stress. He tried hydroxyzine once, taking 10 mg on a Tuesday night, which helped him sleep through the night. He describes his anxiety as coming in periods, feeling 'on edge' during these times. He has not heard of Buspar or been prescribed it before.  His blood pressure readings are occasionally elevated during episodes of anxiety and palpitations but are not consistently high. He has not been diagnosed with hypertension. He typically consumes one cup of coffee in the morning and avoids caffeine to prevent exacerbating his symptoms.       Review of Systems  Constitutional:  Negative for chills, fatigue and fever.  HENT:  Negative for congestion and drooling.   Respiratory:  Negative for cough, chest tightness and wheezing.   Cardiovascular:  Positive for palpitations. Negative for chest pain.       Palpitations were lasting for about one hour. But none in about a week ago.  Gastrointestinal:  Negative for abdominal pain.   Musculoskeletal:  Negative for back pain and joint swelling.  Skin:  Negative for rash.  Neurological:  Negative for dizziness and numbness.  Hematological:  Negative for adenopathy. Does not bruise/bleed easily.  Psychiatric/Behavioral:  Negative for behavioral problems, confusion, sleep disturbance and suicidal ideas. The patient is nervous/anxious.      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   Obesity    Thyroid disease    Transaminitis    hx mild, thought fibrate related - w/u ~2008 neg HbsAg, Hep C, ANA, nl iron/ferritin, ceruloplasmin, a1-antitrypsin, normal [place]     Social History   Socioeconomic History   Marital status: Married    Spouse name: Not on file   Number of children: 2   Years of education: college   Highest education level: Bachelor's degree (e.g., BA, AB, BS)  Occupational History   Occupation: Dealer: OTHER    Comment: [employer]  Tobacco Use   Smoking status: Never   Smokeless tobacco: Never  Vaping Use   Vaping status: Never Used  Substance and Sexual Activity   Alcohol use: Not Currently    Comment: Rare   Drug use: No   Sexual activity: Not on file  Other Topics Concern  Not on file  Social History Narrative   Caffeine: occasionally.Lives with wife [name]), 2 children (son and daughter), 1 catOccupation: Activity: no regular exerciseDiet: good water, daily fruits/vegetables, red meat rarely, fish 1x/wk, cut out fast food      Writes with left hand and does things with his right hand.      Lives in a two story home    Social Drivers of Health   Financial Resource Strain: Low Risk  (08/20/2023)   Overall Financial Resource Strain (CARDIA)     Difficulty of Paying Living Expenses: Not hard at all  Food Insecurity: No Food Insecurity (08/20/2023)   Hunger Vital Sign    Worried About Running Out of Food in the Last Year: Never true    Ran Out of Food in the Last Year: Never true  Transportation Needs: No Transportation Needs (08/20/2023)   PRAPARE - Administrator, Civil Service (Medical): No    Lack of Transportation (Non-Medical): No  Physical Activity: Sufficiently Active (08/20/2023)   Exercise Vital Sign    Days of Exercise per Week: 6 days    Minutes of Exercise per Session: 30 min  Stress: No Stress Concern Present (08/20/2023)   [employer] of Occupational Health - Occupational Stress Questionnaire    Feeling of Stress : Only a little  Social Connections: Moderately Integrated (08/20/2023)   Social Connection and Isolation Panel [NHANES]    Frequency of Communication with Friends and Family: Once a week    Frequency of Social Gatherings with Friends and Family: Once a week    Attends Religious Services: More than 4 times per year    Active Member of [employer] or Organizations: Yes    Attends Banker Meetings: More than 4 times per year    Marital Status: Married  Catering manager Violence: Not on file    Past Surgical History:  Procedure Laterality Date   COLONOSCOPY  10/2015   one polyp, diverticulosis, rpt 5 yrs ([facility])   CRANIOTOMY N/A 11/05/2021   Procedure: 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;  Surgeon: Jadene Pierini, MD;  Location: [facility] OR;  Service: Neurosurgery;  Laterality: N/A;   GANGLION CYST EXCISION Right 2008    wrist   PLACEMENT OF LUMBAR DRAIN N/A 11/05/2021   Procedure: PLACEMENT OF LUMBAR DRAIN;  Surgeon: Jadene Pierini, MD;  Location: [facility] OR;  Service: Neurosurgery;  Laterality: N/A;   TRANSPHENOIDAL APPROACH EXPOSURE N/A 11/05/2021   Procedure: TRANSPHENOIDAL APPROACH EXPOSURE;  Surgeon: Osborn Coho, MD;  Location: [facility] OR;   Service: ENT;  Laterality: N/A;    Family History  Problem Relation Age of Onset   Hypertension Mother    Hyperlipidemia Father        TG   Diverticulitis Father    Diabetes Maternal Grandmother    Colon polyps Maternal Grandfather    Coronary artery disease Paternal Grandfather 8       unhealthy   Breast cancer Other 32       great grandmother, maternal   Stroke Neg Hx    Colon cancer Neg Hx    Esophageal cancer Neg Hx    Rectal cancer Neg Hx    Stomach cancer Neg Hx    Adrenal disorder Neg Hx     No Known Allergies  Current Outpatient Medications on File Prior to Visit  Medication Sig Dispense Refill   ALPRAZolam (XANAX) 0.5 MG tablet Take 1 tablet (0.5 mg total) by mouth 2 (  two) times daily as needed for anxiety. 5 tablet 0   Cholecalciferol (VITAMIN D) 125 MCG (5000 UT) CAPS Take 5,000 Units by mouth daily at 6 (six) AM.     dicyclomine (BENTYL) 10 MG capsule Take 1 capsule (10 mg total) by mouth every 8 (eight) hours as needed for spasms. 30 capsule 3   fenofibrate (TRICOR) 48 MG tablet Take 1 tablet (48 mg total) by mouth daily. 90 tablet 3   fluticasone (FLONASE) 50 MCG/ACT nasal spray Place 2 sprays into both nostrils daily as needed for allergies or rhinitis.     Ginger, Zingiber officinalis, (GINGER PO) Take 550 mg by mouth 2 (two) times daily.     hydrOXYzine (ATARAX) 10 MG tablet 1-2 tab po at bedtime prn anxiety or insomnia 30 tablet 0   levothyroxine (SYNTHROID) 50 MCG tablet Take 1 tablet (50 mcg total) by mouth daily. 90 tablet 3   NEEDLE, DISP, 25 G 25G X 5/8" MISC 1 Device by Does not apply route every 14 (fourteen) days. 10 each 3   Omega-3 Fatty Acids (FISH OIL PO) Take 1 capsule by mouth at bedtime.     polyethylene glycol (MIRALAX) 17 g packet Take 17 g by mouth daily as needed. 14 each 0   Syringe/Needle, Disp, 18G X 1" 3 ML MISC 1 Device by Does not apply route every 14 (fourteen) days. 10 each 3   Testosterone Cypionate 200 MG/ML SOLN Inject 0.75 mLs  as directed every 14 (fourteen) days. 2 mL 5   No current facility-administered medications on file prior to visit.    BP 133/82 (BP Location: Right Arm, Patient Position: Sitting, Cuff Size: Normal)   Pulse 75   Temp 97.6 F (36.4 C) (Oral)   Resp 18   Ht 5\' 11"  (1.803 m)   Wt 216 lb 12.8 oz (98.3 kg)   SpO2 99%   BMI 30.24 kg/m        Objective:   Physical Exam  General Mental Status- Alert. General Appearance- Not in acute distress.   Skin General: Color- Normal Color. Moisture- Normal Moisture.  Neck Carotid Arteries- Normal color. Moisture- Normal Moisture. No carotid bruits. No JVD.  Chest and Lung Exam Auscultation: Breath Sounds:-Normal.  Cardiovascular Auscultation:Rythm- Regular. Murmurs & Other Heart Sounds:Auscultation of the heart reveals- No Murmurs.  Abdomen Inspection:-Inspeection Normal. Palpation/Percussion:Note:No mass. Palpation and Percussion of the abdomen reveal- Non Tender, Non Distended + BS, no rebound or guarding.   Neurologic Cranial Nerve exam:- CN III-XII intact(No nystagmus), symmetric smile. Strength:- 5/5 equal and symmetric strength both upper and lower extremities.       Assessment & Plan:   Palpitations   Intermittent palpitations, primarily nocturnal, linked to anxiety and occasional hypertension. Discussed beta blockers and  side effects. Cardiovascular risk evaluation planned with cardiologist to give opinion on patient high cholesterol - Order EKG.  sinus Rythm on review. Looks like t wave change on v1 and v1 compared to 2020 ekg. Pt has had no chest pain. No current associated cardiac symptoms. As approach weekend decided to do one set troponin stat. If negative but any recurrent constant cardiac symptoms whether that be palpitations or chest pain then be seen in ED. - Update cardiologist referral to include palpitations.   - Advise cessation of caffeine.   -May need ziopatch.    Anxiety   Anxiety exacerbates  palpitations. Xanax used for severe episodes. Hydroxyzine effective once. Discussed Buspar as a non-controlled, less sedating option.   - Prescribe Buspar 7.5  mg twice daily.   - Use Xanax for breakthrough anxiety.   - Discontinue hydroxyzine.    Elevated Blood Pressure   Mild blood pressure elevations during anxiety episodes. Well-controlled currently. Beta blocker may be considered if needed for palpitations.   - Monitor blood pressure.   - Reassess need for medication post-cardiology evaluation.   Follow up in one month to review anxiety or sooner if needed.    Time spent with patient today was 42 minutes which consisted of chart review, discussing diagnosis, work up treatment and documentation. Total time did not include EKG time.

## 2023-10-15 NOTE — Patient Instructions (Signed)
 Palpitations   Intermittent palpitations, primarily nocturnal, linked to anxiety and occasional hypertension. Discussed beta blockers and  side effects. Cardiovascular risk evaluation planned with cardiologist to give opinion on patient high cholesterol - Order EKG.  sinus Rythm on review. Looks like t wave change on v1 and v1 compared to 2020 ekg. Pt has had no chest pain. No current associated cardiac symptoms. As approach weekend decided to do one set troponin stat. If negative but any recurrent constant cardiac symptoms whether that be palpitations or chest pain then be seen in ED. - Update cardiologist referral to include palpitations.   - Advise cessation of caffeine.   -May need ziopatch.  Anxiety   Anxiety exacerbates palpitations. Xanax used for severe episodes. Hydroxyzine effective once. Discussed Buspar as a non-controlled, less sedating option.   - Prescribe Buspar 7.5 mg twice daily.   - Use Xanax for breakthrough anxiety.   - Discontinue hydroxyzine.    Elevated Blood Pressure   Mild blood pressure elevations during anxiety episodes. Well-controlled currently. Beta blocker may be considered if needed for palpitations.   - Monitor blood pressure.   - Reassess need for medication post-cardiology evaluation.   Follow up in one month to review anxiety or sooner if needed.

## 2023-10-21 DIAGNOSIS — M1990 Unspecified osteoarthritis, unspecified site: Secondary | ICD-10-CM | POA: Insufficient documentation

## 2023-10-21 DIAGNOSIS — E079 Disorder of thyroid, unspecified: Secondary | ICD-10-CM | POA: Insufficient documentation

## 2023-10-21 DIAGNOSIS — E049 Nontoxic goiter, unspecified: Secondary | ICD-10-CM | POA: Insufficient documentation

## 2023-10-21 DIAGNOSIS — E669 Obesity, unspecified: Secondary | ICD-10-CM | POA: Insufficient documentation

## 2023-10-21 DIAGNOSIS — E785 Hyperlipidemia, unspecified: Secondary | ICD-10-CM | POA: Insufficient documentation

## 2023-10-21 DIAGNOSIS — Z8619 Personal history of other infectious and parasitic diseases: Secondary | ICD-10-CM | POA: Insufficient documentation

## 2023-10-21 DIAGNOSIS — F419 Anxiety disorder, unspecified: Secondary | ICD-10-CM | POA: Insufficient documentation

## 2023-10-22 ENCOUNTER — Ambulatory Visit: Payer: BC Managed Care – PPO | Attending: Cardiology | Admitting: Cardiology

## 2023-10-22 ENCOUNTER — Ambulatory Visit: Attending: Cardiology

## 2023-10-22 ENCOUNTER — Encounter: Payer: Self-pay | Admitting: Cardiology

## 2023-10-22 VITALS — BP 134/86 | HR 80 | Ht 71.0 in | Wt 216.0 lb

## 2023-10-22 DIAGNOSIS — E66811 Obesity, class 1: Secondary | ICD-10-CM

## 2023-10-22 DIAGNOSIS — E785 Hyperlipidemia, unspecified: Secondary | ICD-10-CM | POA: Diagnosis not present

## 2023-10-22 DIAGNOSIS — R002 Palpitations: Secondary | ICD-10-CM | POA: Diagnosis not present

## 2023-10-22 DIAGNOSIS — R011 Cardiac murmur, unspecified: Secondary | ICD-10-CM | POA: Diagnosis not present

## 2023-10-22 MED ORDER — ROSUVASTATIN CALCIUM 20 MG PO TABS: 20.0000 mg | ORAL_TABLET | Freq: Every day | ORAL | 3 refills | Status: AC

## 2023-10-22 NOTE — Progress Notes (Signed)
 Cardiology Office Note:    Date:  10/22/2023   ID:  [name], DOB 08/21/1970, MRN [id number]  PCP:  Esperanza Richters, PA-C  Cardiologist:  Garwin Brothers, MD   Referring MD: Esperanza Richters, PA-C    ASSESSMENT:    1. Hyperlipidemia, unspecified hyperlipidemia type   2. Obesity (BMI 30.0-34.9)   3. Palpitations    PLAN:    In order of problems listed above:  Primary prevention stressed with the patient.  Importance of compliance with diet medication stressed and patient verbalized standing. I congratulated him about his excellent exercise and effort tolerance and protocol. Family hyperlipidemia: Lipids are markedly elevated.  Diet emphasized.  I will start him on rosuvastatin 20 mg daily and liver lipid check in 6 weeks.  He will also take co-Q10.  He is used atorvastatin in the past without any issues.  Multiple LFTs reviewed from the past have been fine. Coronary risk stratification: I discussed calcium scoring to be more focused on his lipid-lowering and to advise him in general about risk stratification and he is agreeable. Cardiac murmur: Echocardiogram will be done to assess murmur heard on auscultation. Palpitations: TSH is fine we will do a 2-week monitor to reassure him.  They do not appear to be causing any significant symptoms. Patient will be seen in follow-up appointment in 6 months or earlier if the patient has any concerns.    Medication Adjustments/Labs and Tests Ordered:  Orders Placed This Encounter  Procedures   CT CARDIAC SCORING   Basic Metabolic Panel (BMET)   Hepatic function panel   Lipid Profile   LONG TERM MONITOR (3-14 DAYS)   EKG 12-Lead   ECHOCARDIOGRAM COMPLETE   Meds ordered this encounter  Medications   rosuvastatin (CRESTOR) 20 MG tablet    Sig: Take 1 tablet (20 mg total) by mouth daily.    Dispense:  90 tablet    Refill:  3     History of Present Illness:    Erik Cole is a 53 y.o. male who is being seen today  for the evaluation of markedly elevated cholesterol and palpitations at the request of Saguier, Ramon Dredge, PA-C.  Patient is a pleasant 53 year old male.  He is an active gentleman.  He has past medical history of markedly elevated lipids.  He also underwent pituitary tumor resection.  Subsequently standpoint.  He tells me that he walks 2 miles without any problems.  He has palpitations mostly in the night he tells me that he can feel his heart sensation.  The heart rates are not high but he is just uncomfortable feeling his heartbeat.  No chest pain orthopnea PND dizziness or syncope.  At the time of my evaluation, the patient is alert awake oriented and in no distress.  Past Medical History:  Diagnosis Date   Anxiety    mild, xanax sparingly   Anxiety, mild 11/27/2010   Arthralgia of both hands 02/05/2021   Arthritis    generalized   De Quervain's disease (tenosynovitis) 01/08/2021   Dyslipidemia    Mainly hypertriglyceridemia     Fatigue 05/14/2021   GERD (gastroesophageal reflux disease)    hx of   Globus sensation 04/11/2013   IMO SNOMED Dx Update Oct 2024     Goiter    Healthcare maintenance 12/31/2011   History of chicken pox    History of colonic diverticulitis 04/17/2015   HLD (hyperlipidemia)    previous PCP stopped tricor 2/2 elevated LFTs 07/2010   Hot flashes 05/14/2021  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   Low serum cortisol level 08/11/2021   Low testosterone 08/11/2021   Lymphocytosis 01/08/2021   Myalgia 05/14/2021   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   Obesity    Obesity, Class I, BMI 30-34.9 07/02/2011   Olecranon bursitis of both elbows 07/24/2021   Paresthesia of both hands 05/14/2021   Patellofemoral pain syndrome of right knee 03/12/2021   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   Snoring 07/24/2015   Thyroid disease     Past Surgical History:  Procedure Laterality Date   COLONOSCOPY  10/2015   one polyp, diverticulosis, rpt 5 yrs ([place])   CRANIOTOMY N/A 11/05/2021   Procedure: 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;  Surgeon: Jadene Pierini, MD;  Location: [facility] OR;  Service: Neurosurgery;  Laterality: N/A;   GANGLION CYST EXCISION Right 2008    wrist   PLACEMENT OF LUMBAR DRAIN N/A 11/05/2021   Procedure: PLACEMENT OF LUMBAR DRAIN;  Surgeon: Jadene Pierini, MD;  Location: [facility] OR;  Service: Neurosurgery;  Laterality: N/A;   TRANSPHENOIDAL APPROACH EXPOSURE N/A 11/05/2021   Procedure: TRANSPHENOIDAL APPROACH EXPOSURE;  Surgeon: Osborn Coho, MD;  Location[facility] OR;  Service: ENT;  Laterality: N/A;    Current Medications: Current Meds  Medication Sig   ALPRAZolam (XANAX) 0.5 MG tablet Take 1 tablet (0.5 mg total) by mouth 2 (two) times daily as needed for anxiety.   busPIRone (BUSPAR) 7.5 MG tablet Take 1 tablet (7.5 mg total) by mouth 2 (two) times daily.   Cholecalciferol (VITAMIN D) 125 MCG (5000 UT) CAPS Take 5,000 Units by mouth daily at 6 (six) AM.   Coenzyme Q10 (CO Q 10 PO) Take 1 tablet by mouth daily.   dicyclomine (BENTYL) 10 MG capsule Take 1 capsule (10 mg total) by mouth every 8 (eight) hours as needed for spasms.   fenofibrate (TRICOR) 48 MG tablet Take 1 tablet (48 mg total) by mouth daily.   fluticasone (FLONASE) 50 MCG/ACT nasal spray Place 2 sprays into both nostrils daily as needed for allergies or rhinitis.   Ginger, Zingiber officinalis, (GINGER PO) Take 550 mg by mouth 2 (two) times daily.   hydrOXYzine (ATARAX) 10 MG tablet 1-2 tab po at bedtime prn anxiety or insomnia   levothyroxine (SYNTHROID) 50 MCG tablet Take 1 tablet (50 mcg total) by mouth daily.   NEEDLE, DISP, 25 G 25G X 5/8" MISC 1 Device by Does not apply route every 14 (fourteen) days.   Omega-3  Fatty Acids (FISH OIL PO) Take 1 capsule by mouth at bedtime.   polyethylene glycol (MIRALAX) 17 g packet Take 17 g by mouth daily as needed.   rosuvastatin (CRESTOR) 20 MG tablet Take 1 tablet (20 mg total) by mouth daily.   Syringe/Needle, Disp, 18G X 1" 3 ML MISC 1 Device by Does not apply route every 14 (fourteen) days.   Testosterone Cypionate 200 MG/ML SOLN Inject 0.75 mLs as directed every 14 (fourteen) days.     Allergies:   Patient has no known allergies.   Social History   Socioeconomic History   Marital status: Married    Spouse name: Not on file   Number of children: 2   Years of education: college   Highest education level: Bachelor's degree (e.g., BA, AB, BS)  Occupational History   Occupation: Psychologist, counselling  Employer: OTHER    Comment: [employer]  Tobacco Use   Smoking status: Never   Smokeless tobacco: Never  Vaping Use   Vaping status: Never Used  Substance and Sexual Activity   Alcohol use: Not Currently    Comment: Rare   Drug use: No   Sexual activity: Not on file  Other Topics Concern   Not on file  Social History Narrative   Caffeine: occasionally.Lives with wife [name]), 2 children (son and daughter), 1 catOccupation: Activity: no regular exerciseDiet: good water, daily fruits/vegetables, red meat rarely, fish 1x/wk, cut out fast food      Writes with left hand and does things with his right hand.      Lives in a two story home    Social Drivers of Health   Financial Resource Strain: Low Risk  (08/20/2023)   Overall Financial Resource Strain (CARDIA)    Difficulty of Paying Living Expenses: Not hard at all  Food Insecurity: No Food Insecurity (08/20/2023)   Hunger Vital Sign    Worried About Running Out of Food in the Last Year: Never true    Ran Out of Food in the Last Year: Never true  Transportation Needs: No Transportation Needs (08/20/2023)   PRAPARE - Administrator, Civil Service (Medical): No    Lack of Transportation  (Non-Medical): No  Physical Activity: Sufficiently Active (08/20/2023)   Exercise Vital Sign    Days of Exercise per Week: 6 days    Minutes of Exercise per Session: 30 min  Stress: No Stress Concern Present (08/20/2023)   [employer] of Occupational Health - Occupational Stress Questionnaire    Feeling of Stress : Only a little  Social Connections: Moderately Integrated (08/20/2023)   Social Connection and Isolation Panel [NHANES]    Frequency of Communication with Friends and Family: Once a week    Frequency of Social Gatherings with Friends and Family: Once a week    Attends Religious Services: More than 4 times per year    Active Member of [employer] or Organizations: Yes    Attends Engineer, structural: More than 4 times per year    Marital Status: Married     Family History: The patient's family history includes Breast cancer (age of onset: 96) in an other family member; Colon polyps in his maternal grandfather; Coronary artery disease (age of onset: 44) in his paternal grandfather; Diabetes in his maternal grandmother; Diverticulitis in his father; Hyperlipidemia in his father; Hypertension in his mother. There is no history of Stroke, Colon cancer, Esophageal cancer, Rectal cancer, Stomach cancer, or Adrenal disorder.  ROS:   Please see the history of present illness.    All other systems reviewed and are negative.  EKGs/Labs/Other Studies Reviewed:    The following studies were reviewed today:  EKG Interpretation Date/Time:  Friday October 22 2023 08:42:21 EDT Ventricular Rate:  80 PR Interval:  192 QRS Duration:  84 QT Interval:  352 QTC Calculation: 405 R Axis:   -23  Text Interpretation: Normal sinus rhythm Possible Anterior infarct , age undetermined When compared with ECG of 18-Jul-2018 15:28, Questionable change in QRS axis Inverted T waves have replaced nonspecific T wave abnormality in Inferior leads Confirmed by Belva Crome [phone]) on 10/22/2023 8:50:53 AM      Recent Labs: 03/17/2023: TSH 1.12 08/27/2023: ALT 14; BUN 14; Creatinine, Ser 0.85; Hemoglobin 16.9; Platelets 252.0; Potassium 3.9; Sodium 138  Recent Lipid Panel    Component Value Date/Time   CHOL  272 (H) 08/27/2023 1004   CHOL 181 09/02/2010 0000   TRIG 146.0 08/27/2023 1004   TRIG 387 09/02/2010 0000   HDL 35.60 (L) 08/27/2023 1004   CHOLHDL 8 08/27/2023 1004   VLDL 29.2 08/27/2023 1004   LDLCALC 207 (H) 08/27/2023 1004   LDLDIRECT 126.0 08/12/2022 1012    Physical Exam:    VS:  BP 134/86   Pulse 80   Ht 5\' 11"  (1.803 m)   Wt 216 lb (98 kg)   SpO2 96%   BMI 30.13 kg/m     Wt Readings from Last 3 Encounters:  10/22/23 216 lb (98 kg)  10/15/23 216 lb 12.8 oz (98.3 kg)  08/27/23 222 lb (100.7 kg)     GEN: Patient is in no acute distress HEENT: Normal NECK: No JVD; No carotid bruits LYMPHATICS: No lymphadenopathy CARDIAC: S1 S2 regular, 2/6 systolic murmur at the apex. RESPIRATORY:  Clear to auscultation without rales, wheezing or rhonchi  ABDOMEN: Soft, non-tender, non-distended MUSCULOSKELETAL:  No edema; No deformity  SKIN: Warm and dry NEUROLOGIC:  Alert and oriented x 3 PSYCHIATRIC:  Normal affect    Signed, Garwin Brothers, MD  10/22/2023 9:22 AM    [facility]

## 2023-10-22 NOTE — Patient Instructions (Signed)
 Medication Instructions:  Your physician has recommended you make the following change in your medication:   START: Co Q 10 1 tablet daily START: Rosuvastatin 20 mg daily  *If you need a refill on your cardiac medications before your next appointment, please call your pharmacy*  Lab Work: Your physician recommends that you return for lab work in:   Labs in 6 weeks: BMP, LFT, Lipids  If you have labs (blood work) drawn today and your tests are completely normal, you will receive your results only by: MyChart Message (if you have MyChart) OR A paper copy in the mail If you have any lab test that is abnormal or we need to change your treatment, we will call you to review the results.  Testing/Procedures: Your physician has requested that you have an echocardiogram. Echocardiography is a painless test that uses sound waves to create images of your heart. It provides your doctor with information about the size and shape of your heart and how well your heart's chambers and valves are working. This procedure takes approximately one hour. There are no restrictions for this procedure. Please do NOT wear cologne, perfume, aftershave, or lotions (deodorant is allowed). Please arrive 15 minutes prior to your appointment time.  Please note: We ask at that you not bring children with you during ultrasound (echo/ vascular) testing. Due to room size and safety concerns, children are not allowed in the ultrasound rooms during exams. Our front office staff cannot provide observation of children in our lobby area while testing is being conducted. An adult accompanying a patient to their appointment will only be allowed in the ultrasound room at the discretion of the ultrasound technician under special circumstances. We apologize for any inconvenience.   A zio monitor was ordered today. It will remain on for 14 days. You will then return monitor and event diary in provided box. It takes 1-2 weeks for report to  be downloaded and returned to [place]. We will call you with the results. If monitor falls off or has orange flashing light, please call Zio for further instructions.   We will order CT coronary calcium score. It will cost $99.00 and is due at time of scan.  Please call to schedule.    [facility] at [facility] [address] [place], [place] [address] [phone]  [facility] [address] [place], [place] [address] [phone]     Follow-Up: At [facility], you and your health needs are our priority.  As part of our continuing mission to provide you with exceptional heart care, our providers are all part of one team.  This team includes your primary Cardiologist (physician) and Advanced Practice Providers or APPs (Physician Assistants and Nurse Practitioners) who all work together to provide you with the care you need, when you need it.  Your next appointment:   1 year(s)  Provider:   Belva Crome, MD    We recommend signing up for the patient portal called "MyChart".  Sign up information is provided on this After Visit Summary.  MyChart is used to connect with patients for Virtual Visits (Telemedicine).  Patients are able to view lab/test results, encounter notes, upcoming appointments, etc.  Non-urgent messages can be sent to your provider as well.   To learn more about what you can do with MyChart, go to [email or web address].   Other Instructions None

## 2023-10-25 ENCOUNTER — Encounter: Payer: Self-pay | Admitting: Medical

## 2023-10-26 ENCOUNTER — Other Ambulatory Visit: Payer: Self-pay | Admitting: Cardiology

## 2023-10-26 DIAGNOSIS — R011 Cardiac murmur, unspecified: Secondary | ICD-10-CM

## 2023-10-26 DIAGNOSIS — E66811 Obesity, class 1: Secondary | ICD-10-CM

## 2023-10-26 DIAGNOSIS — R002 Palpitations: Secondary | ICD-10-CM

## 2023-10-26 DIAGNOSIS — E785 Hyperlipidemia, unspecified: Secondary | ICD-10-CM

## 2023-10-28 MED ORDER — ALPRAZOLAM 0.5 MG PO TABS
0.5000 mg | ORAL_TABLET | Freq: Two times a day (BID) | ORAL | 0 refills | Status: DC | PRN
Start: 2023-10-28 — End: 2023-11-18

## 2023-10-28 NOTE — Addendum Note (Signed)
 Addended by: Serafina Damme on: 10/28/2023 05:38 PM   Modules accepted: Orders

## 2023-11-05 ENCOUNTER — Encounter: Payer: Self-pay | Admitting: Podiatry

## 2023-11-05 ENCOUNTER — Other Ambulatory Visit: Payer: Self-pay

## 2023-11-05 ENCOUNTER — Ambulatory Visit

## 2023-11-05 ENCOUNTER — Telehealth: Admitting: Medical

## 2023-11-05 ENCOUNTER — Ambulatory Visit (INDEPENDENT_AMBULATORY_CARE_PROVIDER_SITE_OTHER): Payer: Self-pay | Admitting: Podiatry

## 2023-11-05 DIAGNOSIS — M7752 Other enthesopathy of left foot: Secondary | ICD-10-CM

## 2023-11-05 DIAGNOSIS — F419 Anxiety disorder, unspecified: Secondary | ICD-10-CM | POA: Diagnosis not present

## 2023-11-05 DIAGNOSIS — E038 Other specified hypothyroidism: Secondary | ICD-10-CM

## 2023-11-05 DIAGNOSIS — M7662 Achilles tendinitis, left leg: Secondary | ICD-10-CM

## 2023-11-05 MED ORDER — MELOXICAM 15 MG PO TABS: 15.0000 mg | ORAL_TABLET | Freq: Every day | ORAL | 0 refills | Status: DC

## 2023-11-05 MED ORDER — VENLAFAXINE HCL ER 37.5 MG PO CP24: 37.5000 mg | ORAL_CAPSULE | Freq: Every day | ORAL | 0 refills | Status: DC

## 2023-11-05 NOTE — Progress Notes (Signed)
  Subjective:  Patient ID: Erik Cole, male    DOB: 1970-08-28,   MRN: [id number]  No chief complaint on file.   53 y.o. male presents for concern of left heel pain that has been going on for about a month. Relates first steps after rest are the most painful. Most of the pain in the back of the heel. Denies any current treatments aside from rest . Denies any other pedal complaints. Denies n/v/f/c.   Past Medical History:  Diagnosis Date   Anxiety    mild, xanax  sparingly   Anxiety, mild 11/27/2010   Arthralgia of both hands 02/05/2021   Arthritis    generalized   De Quervain's disease (tenosynovitis) 01/08/2021   Dyslipidemia    Mainly hypertriglyceridemia     Fatigue 05/14/2021   GERD (gastroesophageal reflux disease)    hx of   Globus sensation 04/11/2013   IMO SNOMED Dx Update Oct 2024     Goiter    Healthcare maintenance 12/31/2011   History of chicken pox    History of colonic diverticulitis 04/17/2015   HLD (hyperlipidemia)    previous PCP stopped tricor  2/2 elevated LFTs 07/2010   Hot flashes 05/14/2021   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   Low serum cortisol level 08/11/2021   Low testosterone  08/11/2021   Lymphocytosis 01/08/2021   Myalgia 05/14/2021   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 CFT <3 seconds. Normal hair growth on digits. No edema.  Skin. No lacerations or abrasions bilateral feet.  Musculoskeletal: MMT 5/5  bilateral lower extremities in DF, PF, Inversion and Eversion. Deceased ROM in DF of ankle joint. Tender ot insertion of achilles tendon area. No pain along medial calcaneal tubercle or with calcaneal squeeze.  Neurological: Sensation intact to light touch.   Assessment:   1. Tendonitis, Achilles, left      Plan:  Patient was evaluated and treated and all questions answered. -Xrays reviewed. No acute fractures or dislocations. Spurring noted posterio calcaneus  -Discussed Achilles insertional tendonitis and treatment options with patient.  -Discussed stretching exercises. -Rx Meloxicam provided . Kidney function wnl.  -Heel lifts provided and discussed proper shoewear.  -Discussed if no improvement will consider MRI/PT/EPAT/PRP injections.  -Patient to return to office in 6 weeks for recheck.    Jennefer Moats, DPM

## 2023-11-05 NOTE — Progress Notes (Signed)
 Virtual Visit via Video Note  I connected with Erik Cole on 11/05/23 at 10:40 AM EDT by a video enabled telemedicine application and verified that I am speaking with the correct person using two identifiers.  Location: Patient: Erik Cole at home Provider: office at home   I discussed the limitations of evaluation and management by telemedicine and the availability of in person appointments. The patient expressed understanding and agreed to proceed.  History of Present Illness: Discussed the use of AI scribe software for clinical note transcription with the patient, who gave verbal consent to proceed.   History of Present Illness       Video visit today to discuss his anxiety.  He reports that BuSpar  was making him lightheaded for about 30 minutes.  He describes this occurred more prominently on the 15 mg dose but also did occur on the 7.5 mg dose.  Also reports that it really never helped with anxiety.  He does report that hydroxyzine  does help at times with moderate anxiety.  He still using Xanax  very sparingly.  On discussion he remembers that Effexor  did help with anxiety and that he did not have any side effects when he used it in the past.  Expresses that he does not want to be on BuSpar  any longer and is willing to restart Effexor .  Also patient is been worked up for palpitations and has seen a cardiologist.  At times he describes low level tachycardia with palpitation.  His cardiologist has ordered a Zio patch echo and cardiac CT scoring.  He is about to end  Zio patch.  His cardiac CT and echo are pending.  He is not reporting any current palpitations or chest pain during video visit.  He wonders whether or not his thyroid  levels being off might contribute to palpitations.  Patient is on thyroid  supplementation.     Observations/Objective: General-no acute distress, pleasant, oriented. Lungs- on inspection lungs appear unlabored. Neck- no tracheal deviation or jvd on  inspection. Neuro- gross motor function appears intact.  Assessment and Plan: Assessment and Plan           Patient Instructions  Making Effexor  37.5 mg tab available to start using once a day.  You mentioned that presently feel well and do not have anxiety but I am making the medication available.  If you do start to use send me a MyChart message.  I think using hydroxyzine  on occasion for moderate anxiety reasonable as well.  Discontinue BuSpar .  Will continue to make low-dose Xanax  and a low number of Xanax  to be used on occasion for severe anxiety/panic attack.  Follow through with the cardiology studies.  Placing future TSH and T4 study to get done early next week.  Follow-up with me May 8 or sooner if needed   Sylvia Everts, PA-C    Follow Up Instructions:    I discussed the assessment and treatment plan with the patient. The patient was provided an opportunity to ask questions and all were answered. The patient agreed with the plan and demonstrated an understanding of the instructions.   The patient was advised to call back or seek an in-person evaluation if the symptoms worsen or if the condition fails to improve as anticipated.   Erik Requena, PA-C

## 2023-11-05 NOTE — Patient Instructions (Signed)

## 2023-11-05 NOTE — Patient Instructions (Signed)
 Making Effexor  37.5 mg tab available to start using once a day.  You mentioned that presently feel well and do not have anxiety but I am making the medication available.  If you do start to use send me a MyChart message.  I think using hydroxyzine  on occasion for moderate anxiety reasonable as well.  Discontinue BuSpar .  Will continue to make low-dose Xanax  and a low number of Xanax  to be used on occasion for severe anxiety/panic attack.  Follow through with the cardiology studies.  Placing future TSH and T4 study to get done early next week.  Follow-up with me May 8 or sooner if needed

## 2023-11-09 ENCOUNTER — Other Ambulatory Visit (INDEPENDENT_AMBULATORY_CARE_PROVIDER_SITE_OTHER)

## 2023-11-09 DIAGNOSIS — E038 Other specified hypothyroidism: Secondary | ICD-10-CM | POA: Diagnosis not present

## 2023-11-09 LAB — T4, FREE: Free T4: 0.98 ng/dL (ref 0.60–1.60)

## 2023-11-09 LAB — TSH: TSH: 1.46 u[IU]/mL (ref 0.35–5.50)

## 2023-11-10 ENCOUNTER — Encounter: Payer: Self-pay | Admitting: Medical

## 2023-11-10 ENCOUNTER — Other Ambulatory Visit

## 2023-11-11 ENCOUNTER — Other Ambulatory Visit: Payer: Self-pay | Admitting: Medical

## 2023-11-11 DIAGNOSIS — R002 Palpitations: Secondary | ICD-10-CM | POA: Diagnosis not present

## 2023-11-17 ENCOUNTER — Encounter: Payer: Self-pay | Admitting: Internal Medicine

## 2023-11-17 LAB — CBC
Hematocrit: 53.8 % — ABNORMAL HIGH (ref 37.5–51.0)
Hemoglobin: 17.3 g/dL (ref 13.0–17.7)
MCH: 27.5 pg (ref 26.6–33.0)
MCHC: 32.2 g/dL (ref 31.5–35.7)
MCV: 86 fL (ref 79–97)
Platelets: 202 10*3/uL (ref 150–450)
RBC: 6.28 x10E6/uL — ABNORMAL HIGH (ref 4.14–5.80)
RDW: 15.7 % — ABNORMAL HIGH (ref 11.6–15.4)
WBC: 5.6 10*3/uL (ref 3.4–10.8)

## 2023-11-17 LAB — PSA: Prostate Specific Ag, Serum: 3.2 ng/mL (ref 0.0–4.0)

## 2023-11-17 LAB — TESTOSTERONE, TOTAL, LC/MS/MS: Testosterone, total: 795.3 ng/dL (ref 264.0–916.0)

## 2023-11-18 ENCOUNTER — Ambulatory Visit (HOSPITAL_BASED_OUTPATIENT_CLINIC_OR_DEPARTMENT_OTHER)

## 2023-11-18 ENCOUNTER — Ambulatory Visit (HOSPITAL_BASED_OUTPATIENT_CLINIC_OR_DEPARTMENT_OTHER)
Admission: RE | Admit: 2023-11-18 | Discharge: 2023-11-18 | Disposition: A | Discharge status: Home / Self Care | Payer: Self-pay | Source: Ambulatory Visit | Attending: Cardiology | Admitting: Cardiology

## 2023-11-18 ENCOUNTER — Ambulatory Visit: Admitting: Medical

## 2023-11-18 VITALS — BP 125/78 | HR 71 | Temp 98.4°F | Resp 16 | Ht 71.0 in | Wt 211.0 lb

## 2023-11-18 DIAGNOSIS — Z79899 Other long term (current) drug therapy: Secondary | ICD-10-CM | POA: Diagnosis not present

## 2023-11-18 DIAGNOSIS — F419 Anxiety disorder, unspecified: Secondary | ICD-10-CM | POA: Diagnosis not present

## 2023-11-18 DIAGNOSIS — R002 Palpitations: Secondary | ICD-10-CM | POA: Insufficient documentation

## 2023-11-18 DIAGNOSIS — E785 Hyperlipidemia, unspecified: Secondary | ICD-10-CM | POA: Insufficient documentation

## 2023-11-18 DIAGNOSIS — E66811 Obesity, class 1: Secondary | ICD-10-CM | POA: Insufficient documentation

## 2023-11-18 MED ORDER — VENLAFAXINE HCL ER 75 MG PO CP24: 75.0000 mg | ORAL_CAPSULE | Freq: Every day | ORAL | 3 refills | Status: DC

## 2023-11-18 MED ORDER — ALPRAZOLAM 0.5 MG PO TABS
0.5000 mg | ORAL_TABLET | Freq: Two times a day (BID) | ORAL | 0 refills | Status: DC | PRN
Start: 2023-11-18 — End: 2024-02-03

## 2023-11-18 NOTE — Progress Notes (Signed)
 Subjective:    Patient ID: Erik Cole, male    DOB: 05/26/1971, 53 y.o.   MRN: [id number]  HPI  Erik Cole is a 53 year old male who presents for follow-up on medication management for anxiety.  His anxiety has been improving since starting Effexor . He initially took 37.5 mg for four to five days before increasing to 75 mg, which he has been taking for about a week and a half. In the past, Effexor  took a couple of weeks to show effects. He experiences occasional breakthrough anxiety, particularly in stressful work situations, and describes a rough couple of weeks at work. He uses Xanax  sparingly, typically half a tablet as needed, and historically requires a prescription every three to four months, though recently he needed it more frequently due to increased anxiety.  Palpitations, which were previously a concern, have resolved. He underwent a cardiac workup including a heart monitor and a cardiac CT study.  Ziopatch was normal and cardiac ct study pending.. An echocardiogram has not yet been done. He is currently taking Crestor  and mentions feeling a little weak.  He has a history of using hydroxyzine  for moderat anxiety and insomnia at night, though he has not used it in several weeks. He prefers to manage anxiety without medication when possible, using Xanax  primarily in social situations where he feels anxious.   Review of Systems  Constitutional:  Negative for chills, fatigue and fever.  HENT:  Negative for congestion.   Respiratory:  Negative for cough, chest tightness, wheezing and stridor.   Cardiovascular:  Negative for chest pain and palpitations.  Gastrointestinal:  Negative for abdominal pain, constipation and nausea.  Musculoskeletal:  Negative for back pain.  Neurological:  Negative for dizziness, speech difficulty, weakness and headaches.  Hematological:  Negative for adenopathy. Does not bruise/bleed easily.  Psychiatric/Behavioral:  Positive for sleep  disturbance. Negative for behavioral problems, decreased concentration, dysphoric mood and suicidal ideas. The patient is nervous/anxious.        Occasional insomnia.    Past Medical History:  Diagnosis Date   Anxiety    mild, xanax  sparingly   Anxiety, mild 11/27/2010   Arthralgia of both hands 02/05/2021   Arthritis    generalized   De Quervain's disease (tenosynovitis) 01/08/2021   Dyslipidemia    Mainly hypertriglyceridemia     Fatigue 05/14/2021   GERD (gastroesophageal reflux disease)    hx of   Globus sensation 04/11/2013   IMO SNOMED Dx Update Oct 2024     Goiter    Healthcare maintenance 12/31/2011   History of chicken pox    History of colonic diverticulitis 04/17/2015   HLD (hyperlipidemia)    previous PCP stopped tricor  2/2 elevated LFTs 07/2010   Hot flashes 05/14/2021   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   Low serum cortisol level 08/11/2021   Low testosterone  08/11/2021   Lymphocytosis 01/08/2021   Myalgia 05/14/2021   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   Obesity    Obesity, Class I, BMI 30-34.9 07/02/2011   Olecranon bursitis of both elbows 07/24/2021   Paresthesia of both hands 05/14/2021   Patellofemoral pain syndrome of right knee 03/12/2021   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   Snoring 07/24/2015   Thyroid  disease      Social History   Socioeconomic History   Marital status: Married    Spouse  name: Not on file   Number of children: 2   Years of education: college   Highest education level: Bachelor's degree (e.g., BA, AB, BS)  Occupational History   Occupation: Dealer: OTHER    Comment: [employer]  Tobacco Use   Smoking status: Never   Smokeless tobacco: Never  Vaping Use   Vaping status: Never Used  Substance and  Sexual Activity   Alcohol use: Not Currently    Comment: Rare   Drug use: No   Sexual activity: Not on file  Other Topics Concern   Not on file  Social History Narrative   Caffeine: occasionally.Lives with wife [name]), 2 children (son and daughter), 1 catOccupation: Activity: no regular exerciseDiet: good water, daily fruits/vegetables, red meat rarely, fish 1x/wk, cut out fast food      Writes with left hand and does things with his right hand.      Lives in a two story home    Social Drivers of Health   Financial Resource Strain: Low Risk  (08/20/2023)   Overall Financial Resource Strain (CARDIA)    Difficulty of Paying Living Expenses: Not hard at all  Food Insecurity: No Food Insecurity (08/20/2023)   Hunger Vital Sign    Worried About Running Out of Food in the Last Year: Never true    Ran Out of Food in the Last Year: Never true  Transportation Needs: No Transportation Needs (08/20/2023)   PRAPARE - Administrator, Civil Service (Medical): No    Lack of Transportation (Non-Medical): No  Physical Activity: Sufficiently Active (08/20/2023)   Exercise Vital Sign    Days of Exercise per Week: 6 days    Minutes of Exercise per Session: 30 min  Stress: No Stress Concern Present (08/20/2023)   [employer] of Occupational Health - Occupational Stress Questionnaire    Feeling of Stress : Only a little  Social Connections: Moderately Integrated (08/20/2023)   Social Connection and Isolation Panel [NHANES]    Frequency of Communication with Friends and Family: Once a week    Frequency of Social Gatherings with Friends and Family: Once a week    Attends Religious Services: More than 4 times per year    Active Member of [employer] or Organizations: Yes    Attends Banker Meetings: More than 4 times per year    Marital Status: Married  Catering manager Violence: Not on file    Past Surgical History:  Procedure Laterality Date   COLONOSCOPY  10/2015   one polyp,  diverticulosis, rpt 5 yrs ([facility])   CRANIOTOMY N/A 11/05/2021   Procedure: 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;  Surgeon: Cannon Champion, MD;  Location: [facility] OR;  Service: Neurosurgery;  Laterality: N/A;   GANGLION CYST EXCISION Right 2008    wrist   PLACEMENT OF LUMBAR DRAIN N/A 11/05/2021   Procedure: PLACEMENT OF LUMBAR DRAIN;  Surgeon: Cannon Champion, MD;  Location: [facility] OR;  Service: Neurosurgery;  Laterality: N/A;   TRANSPHENOIDAL APPROACH EXPOSURE N/A 11/05/2021   Procedure: TRANSPHENOIDAL APPROACH EXPOSURE;  Surgeon: Ammon Bales, MD;  Location: [facility] OR;  Service: ENT;  Laterality: N/A;    Family History  Problem Relation Age of Onset   Hypertension Mother    Hyperlipidemia Father        TG   Diverticulitis Father    Diabetes Maternal Grandmother    Colon polyps Maternal Grandfather    Coronary artery disease Paternal Grandfather  55       unhealthy   Breast cancer Other 80       great grandmother, maternal   Stroke Neg Hx    Colon cancer Neg Hx    Esophageal cancer Neg Hx    Rectal cancer Neg Hx    Stomach cancer Neg Hx    Adrenal disorder Neg Hx     No Known Allergies  Current Outpatient Medications on File Prior to Visit  Medication Sig Dispense Refill   Cholecalciferol  (VITAMIN D ) 125 MCG (5000 UT) CAPS Take 5,000 Units by mouth daily at 6 (six) AM.     Coenzyme Q10 (CO Q 10 PO) Take 1 tablet by mouth daily.     dicyclomine  (BENTYL ) 10 MG capsule Take 1 capsule (10 mg total) by mouth every 8 (eight) hours as needed for spasms. 30 capsule 3   fluticasone  (FLONASE ) 50 MCG/ACT nasal spray Place 2 sprays into both nostrils daily as needed for allergies or rhinitis.     Ginger, Zingiber officinalis, (GINGER PO) Take 550 mg by mouth 2 (two) times daily.     hydrOXYzine  (ATARAX ) 10 MG tablet 1-2 tab po at bedtime prn anxiety or insomnia 30 tablet 0   levothyroxine  (SYNTHROID ) 50 MCG tablet Take 1 tablet (50 mcg total) by  mouth daily. 90 tablet 3   meloxicam  (MOBIC ) 15 MG tablet Take 1 tablet (15 mg total) by mouth daily. 30 tablet 0   NEEDLE, DISP, 25 G 25G X 5/8" MISC 1 Device by Does not apply route every 14 (fourteen) days. 10 each 3   Omega-3 Fatty Acids  (FISH OIL PO) Take 1 capsule by mouth at bedtime.     polyethylene glycol (MIRALAX ) 17 g packet Take 17 g by mouth daily as needed. 14 each 0   rosuvastatin  (CRESTOR ) 20 MG tablet Take 1 tablet (20 mg total) by mouth daily. 90 tablet 3   Syringe/Needle, Disp, 18G X 1" 3 ML MISC 1 Device by Does not apply route every 14 (fourteen) days. 10 each 3   Testosterone  Cypionate 200 MG/ML SOLN Inject 0.75 mLs as directed every 14 (fourteen) days. 2 mL 5   venlafaxine  XR (EFFEXOR  XR) 37.5 MG 24 hr capsule Take 1 capsule (37.5 mg total) by mouth daily with breakfast. 30 capsule 0   No current facility-administered medications on file prior to visit.    BP 125/78   Pulse 71   Temp 98.4 F (36.9 C) (Oral)   Resp 16   Ht 5\' 11"  (1.803 m)   Wt 211 lb (95.7 kg)   SpO2 97%   BMI 29.43 kg/m        Objective:   Physical Exam  General Mental Status- Alert. General Appearance- Not in acute distress.   Skin General: Color- Normal Color. Moisture- Normal Moisture.  Neck Carotid Arteries- Normal color. Moisture- Normal Moisture. No carotid bruits. No JVD.  Chest and Lung Exam Auscultation: Breath Sounds:-CTA  Cardiovascular Auscultation:Rythm- RRR. Murmurs & Other Heart Sounds:Auscultation of the heart reveals- No Murmurs.  Abdomen Inspection:-Inspeection Normal. Palpation/Percussion:Note:No mass. Palpation and Percussion of the abdomen reveal- Non Tender, Non Distended + BS, no rebound or guarding.   Neurologic Cranial Nerve exam:- CN III-XII intact(No nystagmus), symmetric smile. Strength:- 5/5 equal and symmetric strength both upper and lower extremities.       Assessment & Plan:   Patient Instructions  Anxiety -improved with effexor .  Occasional rare high anxiety at work requiring rare use of xanax . Recently stress at work more  than had been in past. Continue 75 mg effexor  daily. Refilled xanax . Update contract and got uds today.  -Can continue rare use of hydroxyzine  for anxiety and insomnia at night.   Will follow cardiac study results when cardiologist send those to me.  Follow up in 3 months or sooner if needed.   Chibueze Beasley, PA-C

## 2023-11-19 NOTE — Patient Instructions (Signed)
 Anxiety -improved with effexor . Occasional rare high anxiety at work requiring rare use of xanax . Recently stress at work more than had been in past. Continue 75 mg effexor  daily. Refilled xanax . Update contract and got uds today.  -Can continue rare use of hydroxyzine  for anxiety and insomnia at night.   Will follow cardiac study results when cardiologist send those to me.  Follow up in 3 months or sooner if needed.

## 2023-11-21 LAB — DRUG MONITORING PANEL 376104, URINE
Alphahydroxyalprazolam: 42 ng/mL — ABNORMAL HIGH
Alphahydroxymidazolam: NEGATIVE ng/mL
Alphahydroxytriazolam: NEGATIVE ng/mL
Aminoclonazepam: NEGATIVE ng/mL
Amphetamines: NEGATIVE ng/mL
Barbiturates: NEGATIVE ng/mL
Benzodiazepines: POSITIVE ng/mL — AB
Cocaine Metabolite: NEGATIVE ng/mL
Desmethyltramadol: NEGATIVE ng/mL
Hydroxyethylflurazepam: NEGATIVE ng/mL
Lorazepam: NEGATIVE ng/mL
Nordiazepam: NEGATIVE ng/mL
Opiates: NEGATIVE ng/mL
Oxazepam: NEGATIVE ng/mL
Oxycodone: NEGATIVE ng/mL
Temazepam: NEGATIVE ng/mL
Tramadol: NEGATIVE ng/mL

## 2023-11-21 LAB — DM TEMPLATE

## 2023-11-22 ENCOUNTER — Telehealth: Payer: Self-pay

## 2023-11-22 DIAGNOSIS — E785 Hyperlipidemia, unspecified: Secondary | ICD-10-CM

## 2023-11-22 NOTE — Telephone Encounter (Signed)
 MyChart message

## 2023-11-22 NOTE — Telephone Encounter (Signed)
-----   Message from Micael Adas Revankar sent at 11/22/2023 10:55 AM EDT ----- Diet and exercise. Elevated calcium  score. Get him in for Ll check and BMP in 3 wiks Nelia Balzarine, MD 11/22/2023 10:54 AM cc pcp

## 2023-11-23 ENCOUNTER — Ambulatory Visit: Payer: Self-pay

## 2023-12-17 ENCOUNTER — Ambulatory Visit (INDEPENDENT_AMBULATORY_CARE_PROVIDER_SITE_OTHER): Payer: Self-pay | Admitting: Podiatry

## 2023-12-17 ENCOUNTER — Encounter: Payer: Self-pay | Admitting: Podiatry

## 2023-12-17 DIAGNOSIS — M7662 Achilles tendinitis, left leg: Secondary | ICD-10-CM

## 2023-12-17 MED ORDER — DICLOFENAC SODIUM 75 MG PO TBEC: 75.0000 mg | DELAYED_RELEASE_TABLET | Freq: Two times a day (BID) | ORAL | 0 refills | Status: DC

## 2023-12-17 NOTE — Progress Notes (Signed)
  Subjective:  Patient ID: Erik Cole, male    DOB: 1970/12/02,   MRN: [id number]  Chief Complaint  Patient presents with   Tendonitis    "It's about the same."    53 y.o. male presents for follow-up of left achilles tendonitis. Relates not much changes. Has been stretching and wearing lifts and taking the meloxicam .  Denies any other pedal complaints. Denies n/v/f/c.   Past Medical History:  Diagnosis Date   Anxiety    mild, xanax  sparingly   Anxiety, mild 11/27/2010   Arthralgia of both hands 02/05/2021   Arthritis    generalized   De Quervain's disease (tenosynovitis) 01/08/2021   Dyslipidemia    Mainly hypertriglyceridemia     Fatigue 05/14/2021   GERD (gastroesophageal reflux disease)    hx of   Globus sensation 04/11/2013   IMO SNOMED Dx Update Oct 2024     Goiter    Healthcare maintenance 12/31/2011   History of chicken pox    History of colonic diverticulitis 04/17/2015   HLD (hyperlipidemia)    previous PCP stopped tricor  2/2 elevated LFTs 07/2010   Hot flashes 05/14/2021   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   Low serum cortisol level 08/11/2021   Low testosterone  08/11/2021   Lymphocytosis 01/08/2021   Myalgia 05/14/2021   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 CFT <3 seconds. Normal hair growth on digits. No edema.  Skin. No lacerations or abrasions bilateral feet.  Musculoskeletal: MMT 5/5 bilateral lower extremities  in DF, PF, Inversion and Eversion. Deceased ROM in DF of ankle joint. Tender to insertion of achilles tendon area. No pain along medial calcaneal tubercle or with calcaneal squeeze.  Neurological: Sensation intact to light touch.   Assessment:   1. Tendonitis, Achilles, left       Plan:  Patient was evaluated and treated and all questions answered. -Xrays reviewed. No acute fractures or dislocations. Spurring noted posterio calcaneus  -Discussed Achilles insertional tendonitis and treatment options with patient.  -Continue stretching. Referral to PT placed.  -Rx Diclofenac provided.  -Continue heel lifts.  -Discussed if no improvement will consider MRI/PT/EPAT/PRP injections.  -Patient to return to office in 9 weeks for recheck.    Jennefer Moats, DPM

## 2023-12-18 LAB — LIPID PANEL
Chol/HDL Ratio: 3.7 ratio (ref 0.0–5.0)
Cholesterol, Total: 133 mg/dL (ref 100–199)
HDL: 36 mg/dL — ABNORMAL LOW (ref >39)
LDL Chol Calc (NIH): 76 mg/dL (ref 0–99)
Triglycerides: 116 mg/dL (ref 0–149)
VLDL Cholesterol Cal: 21 mg/dL (ref 5–40)

## 2023-12-18 LAB — HEPATIC FUNCTION PANEL
ALT: 17 IU/L (ref 0–44)
AST: 20 IU/L (ref 0–40)
Albumin: 4.6 g/dL (ref 3.8–4.9)
Alkaline Phosphatase: 72 IU/L (ref 44–121)
Bilirubin Total: 0.6 mg/dL (ref 0.0–1.2)
Bilirubin, Direct: 0.11 mg/dL (ref 0.00–0.40)
Total Protein: 7.1 g/dL (ref 6.0–8.5)

## 2023-12-21 ENCOUNTER — Ambulatory Visit: Payer: Self-pay | Admitting: Cardiology

## 2023-12-29 ENCOUNTER — Ambulatory Visit: Attending: Podiatry | Admitting: Physical Therapy

## 2023-12-29 ENCOUNTER — Other Ambulatory Visit: Payer: Self-pay

## 2023-12-29 ENCOUNTER — Encounter: Payer: Self-pay | Admitting: Physical Therapy

## 2023-12-29 DIAGNOSIS — M25572 Pain in left ankle and joints of left foot: Secondary | ICD-10-CM | POA: Diagnosis present

## 2023-12-29 DIAGNOSIS — M7662 Achilles tendinitis, left leg: Secondary | ICD-10-CM | POA: Insufficient documentation

## 2023-12-29 DIAGNOSIS — R262 Difficulty in walking, not elsewhere classified: Secondary | ICD-10-CM | POA: Insufficient documentation

## 2023-12-29 DIAGNOSIS — G8929 Other chronic pain: Secondary | ICD-10-CM | POA: Diagnosis present

## 2023-12-29 NOTE — Therapy (Signed)
 OUTPATIENT PHYSICAL THERAPY LOWER EXTREMITY EVALUATION   Patient Name: Melvyn Hommes MRN: [id number] DOB:01/13/1971, 53 y.o., male Today's Date: 12/29/2023  END OF SESSION:  PT End of Session - 12/29/23 1620     Visit Number 1    Number of Visits 16    Date for PT Re-Evaluation 02/23/24    Authorization Type BCBS    PT Start Time 1445    PT Stop Time 1525    PT Time Calculation (min) 40 min    Activity Tolerance Patient tolerated treatment well    Behavior During Therapy [facility] for tasks assessed/performed          Past Medical History:  Diagnosis Date   Anxiety    mild, xanax  sparingly   Anxiety, mild 11/27/2010   Arthralgia of both hands 02/05/2021   Arthritis    generalized   De Quervain's disease (tenosynovitis) 01/08/2021   Dyslipidemia    Mainly hypertriglyceridemia     Fatigue 05/14/2021   GERD (gastroesophageal reflux disease)    hx of   Globus sensation 04/11/2013   IMO SNOMED Dx Update Oct 2024     Goiter    Healthcare maintenance 12/31/2011   History of chicken pox    History of colonic diverticulitis 04/17/2015   HLD (hyperlipidemia)    previous PCP stopped tricor  2/2 elevated LFTs 07/2010   Hot flashes 05/14/2021   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   Low serum cortisol level 08/11/2021   Low testosterone  08/11/2021   Lymphocytosis 01/08/2021   Myalgia 05/14/2021   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   Obesity    Obesity, Class I, BMI 30-34.9 07/02/2011   Olecranon bursitis of both elbows 07/24/2021   Paresthesia of both hands 05/14/2021   Patellofemoral pain syndrome of right knee 03/12/2021   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   Snoring 07/24/2015   Thyroid  disease    Past Surgical History:  Procedure Laterality Date   COLONOSCOPY  10/2015    one polyp, diverticulosis, rpt 5 yrs ([facility])   CRANIOTOMY N/A 11/05/2021   Procedure: 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;  Surgeon: Cannon Champion, MD;  Location: [facility] OR;  Service: Neurosurgery;  Laterality: N/A;   GANGLION CYST EXCISION Right 2008    wrist   PLACEMENT OF LUMBAR DRAIN N/A 11/05/2021   Procedure: PLACEMENT OF LUMBAR DRAIN;  Surgeon: Cannon Champion, MD;  Location: [facility] OR;  Service: Neurosurgery;  Laterality: N/A;   TRANSPHENOIDAL APPROACH EXPOSURE N/A 11/05/2021   Procedure: TRANSPHENOIDAL APPROACH EXPOSURE;  Surgeon: Ammon Bales, MD;  Location: [facility] OR;  Service: ENT;  Laterality: N/A;   Patient Active Problem List   Diagnosis Date Noted   Obesity (BMI 30.0-34.9) 10/22/2023   Palpitations 10/22/2023   Cardiac murmur 10/22/2023   Anxiety    Arthritis    Goiter    History of chicken pox    HLD (hyperlipidemia)    Obesity    Thyroid  disease    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9 07/02/2011   Anxiety, mild 11/27/2010   Dyslipidemia    GERD (gastroesophageal reflux disease)     PCP:  Barron Borg PROVIDER: Jennefer Moats  REFERRING DIAG: Lt achilles tendonitis  THERAPY DIAG:  Chronic pain of left ankle  Difficulty in walking, not elsewhere classified  Rationale for Evaluation and Treatment: Rehabilitation  ONSET DATE: December 2024  SUBJECTIVE:   SUBJECTIVE STATEMENT: Pt states that at the end of last year he began having pain in his posterior heel. He says he had begun walking more and thinks that this may have caused some increased pain. He states that MD prescribed meds which helped but when he stopped taking the meds the pain returned. Pain increases when standing after prolonged sitting or sleeping. Pain will increase after walking > 30 minutes  PERTINENT HISTORY: None reported PAIN:  Are you having pain? Yes: NPRS scale: 1/10 currently, 8/10 at worst Pain location: Lt achilles insertion Pain description: sore Aggravating factors: standing and walking after prolonged sitting or lying Relieving factors: meds, rest  PRECAUTIONS: None  RED FLAGS: None   WEIGHT BEARING RESTRICTIONS: No  FALLS:  Has patient fallen in last 6 months? 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  DIAGNOSTIC FINDINGS: Lt foot x ray: 1. Mild great toe metatarsophalangeal joint osteoarthritis. 2. Mild chronic enthesopathic change at the Achilles insertion on the calcaneus.    COGNITION: Overall cognitive status: Within functional limits for tasks assessed     SENSATION: WFL  EDEMA:  None noted on eval   PALPATION: TTP Lt achilles insertion No muscle spasticity noted in calf musculature  Hypomobile TC mobs  LOWER EXTREMITY ROM:  Active ROM Right eval Left eval  Hip flexion    Hip extension    Hip abduction    Hip adduction     Hip internal rotation    Hip external rotation    Knee flexion    Knee extension    Ankle dorsiflexion 2 2  Ankle plantarflexion 75 74  Ankle inversion 29 31 - pain end range  Ankle eversion 8 10   (Blank rows = not tested)  LOWER EXTREMITY MMT:  MMT Right eval Left eval  Hip flexion    Hip extension    Hip abduction    Hip adduction    Hip internal rotation    Hip external rotation    Great toe flexion 4 4  Great toe extension 4 4 - pain  Ankle dorsiflexion 4+ 4  Ankle plantarflexion 4 4-  Ankle inversion 4+ 4  Ankle eversion 4 4   (Blank rows = not tested)    FUNCTIONAL TESTS:  SLS: Rt > 20 seconds, Lt 9 seconds Increased pain with eccentric lowering from heel raise  GAIT: Distance walked: 50' Assistive device utilized: None Level of assistance: Complete Independence Comments: increased supination bilat feet  TREATMENT DATE: 12/29/23 See HEP Pt eduated on PT POC and goals, rationale for treatment, relevant anatomy    PATIENT EDUCATION:  Education details: PT POC and goals, HEP, ionto Person educated: Patient Education method: Explanation, Demonstration, and Handouts Education comprehension: verbalized understanding and returned demonstration  HOME EXERCISE PROGRAM: Access Code: [email or web address] URL: [email or web address] Date: 12/29/2023 Prepared by: Lowery Rue  Exercises - Soleus Stretch on Wall  - 1 x daily - 7 x weekly - 1 sets - 3 reps - 20-30 seconds hold - Gastroc Stretch on Wall  - 1 x daily - 7 x weekly - 1 sets - 3 reps - 20-30 seconds hold - Eccentric Heel Lowering on Step  - 1 x daily - 7 x weekly - 3 sets - 10 reps - Ankle Inversion Eversion Towel Slide  - 1 x daily - 7 x weekly - 3 sets - 10 reps  ASSESSMENT:  CLINICAL IMPRESSION: Patient is a 53 y.o. male who was seen today for physical therapy  evaluation and treatment for Lt achilles tendonitis. He presents with decreased ROM, increased pain, decreased functional mobility. Pt will benefit from skilled PT to address deficits and improve functional mobility with decreased pain.   OBJECTIVE IMPAIRMENTS: decreased activity tolerance, decreased ROM, decreased strength, hypomobility, and pain.   ACTIVITY LIMITATIONS: locomotion level  PARTICIPATION LIMITATIONS: community activity  PERSONAL FACTORS: Time since onset of injury/illness/exacerbation are also affecting patient's functional outcome.   REHAB POTENTIAL: Good  CLINICAL DECISION MAKING: Evolving/moderate complexity  EVALUATION COMPLEXITY: Moderate   GOALS: Goals reviewed with patient? Yes  SHORT TERM GOALS: Target date: 01/26/2024   Pt will be independent in initial HEP Baseline: Goal status: INITIAL  2.  Pt will report pain <= 5/10 after standing from prolonged sitting or sleeping Baseline:  Goal status: INITIAL    LONG TERM GOALS: Target date: 02/23/2024    Pt will be independent with advanced HEP Baseline:  Goal status: INITIAL  2.  Pt will walk  with pain <= 2/10 after prolonged sitting or sleep  Baseline:  Goal status: INITIAL  3.  Pt will tolerate Lt SLS x 20 seconds without increase in pain Baseline:  Goal status: INITIAL    PLAN:  PT FREQUENCY: 1-2x/week  PT DURATION: 8 weeks  PLANNED INTERVENTIONS: 97164- PT Re-evaluation, 97110-Therapeutic exercises, 97530- Therapeutic activity, [id number]- Neuromuscular re-education, 97535- Self Care, 11914- Manual therapy, [id number]- Aquatic Therapy, [id number]- Electrical stimulation (unattended), 97016- Vasopneumatic device, [id number]- Ultrasound, 62130- Ionotophoresis 4mg /ml Dexamethasone , Patient/Family education, Balance training, Stair training, Taping, Cryotherapy, and Moist heat  PLAN FOR NEXT SESSION: try Ionto - ankle eccentrics, foot/toe strengthening   Shresta Risden, PT 12/29/2023, 4:23 PM

## 2024-01-11 ENCOUNTER — Ambulatory Visit: Attending: Podiatry | Admitting: Physical Therapy

## 2024-01-11 ENCOUNTER — Encounter: Payer: Self-pay | Admitting: Physical Therapy

## 2024-01-11 DIAGNOSIS — G8929 Other chronic pain: Secondary | ICD-10-CM | POA: Diagnosis present

## 2024-01-11 DIAGNOSIS — M25572 Pain in left ankle and joints of left foot: Secondary | ICD-10-CM | POA: Diagnosis present

## 2024-01-11 DIAGNOSIS — R262 Difficulty in walking, not elsewhere classified: Secondary | ICD-10-CM | POA: Insufficient documentation

## 2024-01-11 NOTE — Therapy (Signed)
 OUTPATIENT PHYSICAL THERAPY LOWER EXTREMITY TREATMENT   Patient Name: Erik Cole MRN: [id number] DOB:Aug 02, 1970, 53 y.o., male Today's Date: 01/11/2024  END OF SESSION:  PT End of Session - 01/11/24 1522     Visit Number 2    Number of Visits 16    Date for PT Re-Evaluation 02/23/24    Authorization Type BCBS    PT Start Time 1445    PT Stop Time 1523    PT Time Calculation (min) 38 min    Activity Tolerance Patient tolerated treatment well    Behavior During Therapy WFL for tasks assessed/performed           Past Medical History:  Diagnosis Date   Anxiety    mild, xanax  sparingly   Anxiety, mild 11/27/2010   Arthralgia of both hands 02/05/2021   Arthritis    generalized   De Quervain's disease (tenosynovitis) 01/08/2021   Dyslipidemia    Mainly hypertriglyceridemia     Fatigue 05/14/2021   GERD (gastroesophageal reflux disease)    hx of   Globus sensation 04/11/2013   IMO SNOMED Dx Update Oct 2024     Goiter    Healthcare maintenance 12/31/2011   History of chicken pox    History of colonic diverticulitis 04/17/2015   HLD (hyperlipidemia)    previous PCP stopped tricor  2/2 elevated LFTs 07/2010   Hot flashes 05/14/2021   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   Low serum cortisol level 08/11/2021   Low testosterone  08/11/2021   Lymphocytosis 01/08/2021   Myalgia 05/14/2021   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   Obesity    Obesity, Class I, BMI 30-34.9 07/02/2011   Olecranon bursitis of both elbows 07/24/2021   Paresthesia of both hands 05/14/2021   Patellofemoral pain syndrome of right knee 03/12/2021   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   Snoring 07/24/2015   Thyroid  disease    Past Surgical History:  Procedure Laterality Date   COLONOSCOPY  10/2015    one polyp, diverticulosis, rpt 5 yrs ([facility])   CRANIOTOMY N/A 11/05/2021   Procedure: 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;  Surgeon: Cheryle Debby LABOR, MD;  Location: [facility] OR;  Service: Neurosurgery;  Laterality: N/A;   GANGLION CYST EXCISION Right 2008    wrist   PLACEMENT OF LUMBAR DRAIN N/A 11/05/2021   Procedure: PLACEMENT OF LUMBAR DRAIN;  Surgeon: Cheryle Debby LABOR, MD;  Location: [facility] OR;  Service: Neurosurgery;  Laterality: N/A;   TRANSPHENOIDAL APPROACH EXPOSURE N/A 11/05/2021   Procedure: TRANSPHENOIDAL APPROACH EXPOSURE;  Surgeon: Mable Lenis, MD;  Location: [facility] OR;  Service: ENT;  Laterality: N/A;   Patient Active Problem List   Diagnosis Date Noted   Obesity (BMI 30.0-34.9) 10/22/2023   Palpitations 10/22/2023   Cardiac murmur 10/22/2023   Anxiety    Arthritis    Goiter    History of chicken pox    HLD (hyperlipidemia)    Obesity    Thyroid  disease    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9 07/02/2011   Anxiety, mild 11/27/2010   Dyslipidemia    GERD (gastroesophageal reflux disease)     PCP:  Dorina Dallas [facility] PROVIDER: Joya Stabs  REFERRING DIAG: Lt achilles tendonitis  THERAPY DIAG:  Chronic pain of left ankle  Difficulty in walking, not elsewhere classified  Rationale for Evaluation and Treatment: Rehabilitation  ONSET DATE: December 2024  SUBJECTIVE:   SUBJECTIVE STATEMENT: Pt states the pain is still there but is not as bad. He has not been walking outside as much due to the heat but he does get 10,000 steps a day at work  PERTINENT HISTORY: None reported Pt states that at the end of last year he began having pain in his posterior heel. He says he had begun walking more and thinks that this may have caused some increased pain. He states that MD prescribed meds which helped but when he stopped taking the meds the pain returned. Pain increases when standing after prolonged sitting or sleeping. Pain will increase after walking > 30 minutes PAIN:  Are you having pain? Yes: NPRS scale: 1/10 currently, 8/10 at worst Pain location: Lt achilles insertion Pain description: sore Aggravating factors: standing and walking after prolonged sitting or lying Relieving factors: meds, rest  PRECAUTIONS: None  RED FLAGS: None   WEIGHT BEARING RESTRICTIONS: No  FALLS:  Has patient fallen in last 6 months? 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  DIAGNOSTIC FINDINGS: Lt foot x ray: 1. Mild great toe metatarsophalangeal joint osteoarthritis. 2. Mild chronic enthesopathic change at the Achilles insertion on the calcaneus.    COGNITION: Overall cognitive status: Within functional limits for tasks assessed     SENSATION: WFL  EDEMA:  None noted on eval   PALPATION: TTP Lt achilles insertion No muscle spasticity noted in calf  musculature  Hypomobile TC mobs  LOWER EXTREMITY ROM:  Active ROM Right eval Left eval  Hip flexion    Hip extension    Hip abduction    Hip adduction    Hip internal rotation    Hip external rotation    Knee flexion    Knee extension    Ankle dorsiflexion 2 2  Ankle plantarflexion 75 74  Ankle inversion 29 31 - pain end range  Ankle eversion 8 10   (Blank rows = not tested)  LOWER EXTREMITY MMT:  MMT Right eval Left eval  Hip flexion    Hip extension    Hip abduction    Hip adduction    Hip internal rotation    Hip external rotation    Great toe flexion 4 4  Great toe extension 4 4 - pain  Ankle dorsiflexion 4+ 4  Ankle plantarflexion 4 4-  Ankle inversion 4+ 4  Ankle eversion 4 4   (Blank rows = not tested)    FUNCTIONAL TESTS:  SLS: Rt > 20  seconds, Lt 9 seconds Increased pain with eccentric lowering from heel raise  GAIT: Distance walked: 50' Assistive device utilized: None Level of assistance: Complete Independence Comments: increased supination bilat feet                                                                                                                                [facility] Adult PT Treatment:                                                DATE: 01/11/24 Therapeutic Exercise: Eccentric heel lowering off step x 12 Heel raises toes out, toes in, x 12  SLS on ground --> on foam Standing Gastroc stretch x 30 sec Standing Soleus stretch x 30 sec Seated toe ext x 12 Toe splay x 10 Arch lift x 10 Inversion/eversion green TB x 12  Manual Therapy: STM Lt gastroc  Modalities: Ionto 4 hr patch 1ml dexamethasone    TREATMENT DATE: 12/29/23 See HEP Pt eduated on PT POC and goals, rationale for treatment, relevant anatomy    PATIENT EDUCATION:  Education details: PT POC and goals, HEP, ionto Person educated: Patient Education method: Explanation, Demonstration, and Handouts Education comprehension: verbalized understanding and returned  demonstration  HOME EXERCISE PROGRAM: Access Code: [email or web address] URL: [email or web address] Date: 12/29/2023 Prepared by: Darice Conine  Exercises - Soleus Stretch on Wall  - 1 x daily - 7 x weekly - 1 sets - 3 reps - 20-30 seconds hold - Gastroc Stretch on Wall  - 1 x daily - 7 x weekly - 1 sets - 3 reps - 20-30 seconds hold - Eccentric Heel Lowering on Step  - 1 x daily - 7 x weekly - 3 sets - 10 reps - Ankle Inversion Eversion Towel Slide  - 1 x daily - 7 x weekly - 3 sets - 10 reps  ASSESSMENT:  CLINICAL IMPRESSION: Pt better able to tolerate stretches and exercises today. Most pain with wt bearing on Lt heel (toe raise). Trial of ionto patch today for pain control  OBJECTIVE IMPAIRMENTS: decreased activity tolerance, decreased ROM, decreased strength, hypomobility, and pain.      GOALS: Goals reviewed with patient? Yes  SHORT TERM GOALS: Target date: 01/26/2024   Pt will be independent in initial HEP Baseline: Goal status: INITIAL  2.  Pt will report pain <= 5/10 after standing from prolonged sitting or sleeping Baseline:  Goal status: INITIAL    LONG TERM GOALS: Target date: 02/23/2024    Pt will be independent with advanced HEP Baseline:  Goal status: INITIAL  2.  Pt will walk  with pain <= 2/10 after prolonged sitting or sleep  Baseline:  Goal status: INITIAL  3.  Pt will tolerate Lt SLS x 20 seconds without increase in pain Baseline:  Goal status: INITIAL  PLAN:  PT FREQUENCY: 1-2x/week  PT DURATION: 8 weeks  PLANNED INTERVENTIONS: 97164- PT Re-evaluation, 97110-Therapeutic exercises, 97530- Therapeutic activity, [id number]- Neuromuscular re-education, 97535- Self Care, 02859- Manual therapy, [phone]- Aquatic Therapy, [phone]- Electrical stimulation (unattended), 97016- Vasopneumatic device, [id number]- Ultrasound, [id number]- Ionotophoresis 4mg /ml Dexamethasone , Patient/Family education, Balance training, Stair training, Taping, Cryotherapy, and Moist  heat  PLAN FOR NEXT SESSION: how was ionto? ankle eccentrics, foot/toe strengthening   Tavaria Mackins, PT 01/11/2024, 3:23 PM

## 2024-01-12 ENCOUNTER — Encounter: Payer: Self-pay | Admitting: Physical Therapy

## 2024-01-12 ENCOUNTER — Ambulatory Visit: Admitting: Physical Therapy

## 2024-01-12 DIAGNOSIS — M25572 Pain in left ankle and joints of left foot: Secondary | ICD-10-CM | POA: Diagnosis not present

## 2024-01-12 DIAGNOSIS — G8929 Other chronic pain: Secondary | ICD-10-CM

## 2024-01-12 DIAGNOSIS — R262 Difficulty in walking, not elsewhere classified: Secondary | ICD-10-CM

## 2024-01-12 NOTE — Therapy (Signed)
 OUTPATIENT PHYSICAL THERAPY LOWER EXTREMITY TREATMENT   Patient Name: Erik Cole MRN: [id number] DOB:1970-11-17, 53 y.o., male Today's Date: 01/12/2024  END OF SESSION:  PT End of Session - 01/12/24 0928     Visit Number 3    Number of Visits 16    Date for PT Re-Evaluation 02/23/24    Authorization Type BCBS    PT Start Time 0840    PT Stop Time 0920    PT Time Calculation (min) 40 min    Activity Tolerance Patient tolerated treatment well    Behavior During Therapy [facility] for tasks assessed/performed            Past Medical History:  Diagnosis Date   Anxiety    mild, xanax  sparingly   Anxiety, mild 11/27/2010   Arthralgia of both hands 02/05/2021   Arthritis    generalized   De Quervain's disease (tenosynovitis) 01/08/2021   Dyslipidemia    Mainly hypertriglyceridemia     Fatigue 05/14/2021   GERD (gastroesophageal reflux disease)    hx of   Globus sensation 04/11/2013   IMO SNOMED Dx Update Oct 2024     Goiter    Healthcare maintenance 12/31/2011   History of chicken pox    History of colonic diverticulitis 04/17/2015   HLD (hyperlipidemia)    previous PCP stopped tricor  2/2 elevated LFTs 07/2010   Hot flashes 05/14/2021   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   Low serum cortisol level 08/11/2021   Low testosterone  08/11/2021   Lymphocytosis 01/08/2021   Myalgia 05/14/2021   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   Obesity    Obesity, Class I, BMI 30-34.9 07/02/2011   Olecranon bursitis of both elbows 07/24/2021   Paresthesia of both hands 05/14/2021   Patellofemoral pain syndrome of right knee 03/12/2021   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 OR;  Service: Neurosurgery;  Laterality: N/A;   GANGLION CYST EXCISION Right 2008    wrist   PLACEMENT OF LUMBAR DRAIN N/A 11/05/2021   Procedure: PLACEMENT OF LUMBAR DRAIN;  Surgeon: Cheryle Debby LABOR, MD;  Location: [facility] OR;  Service: Neurosurgery;  Laterality: N/A;   TRANSPHENOIDAL APPROACH EXPOSURE N/A 11/05/2021   Procedure: TRANSPHENOIDAL APPROACH EXPOSURE;  Surgeon: Mable Lenis, MD;  Location: [facility] OR;  Service: ENT;  Laterality: N/A;   Patient Active Problem List   Diagnosis Date Noted   Obesity (BMI 30.0-34.9) 10/22/2023   Palpitations 10/22/2023   Cardiac murmur 10/22/2023   Anxiety    Arthritis    Goiter    History of chicken pox    HLD (hyperlipidemia)    Obesity    Thyroid  disease    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9 07/02/2011   Anxiety, mild 11/27/2010   Dyslipidemia    GERD (gastroesophageal reflux disease)     PCP:  Dorina Dallas [facility] PROVIDER: Joya Stabs  REFERRING DIAG: Lt achilles tendonitis  THERAPY DIAG:  Chronic pain of left ankle  Difficulty in walking, not elsewhere classified  Rationale for Evaluation and Treatment: Rehabilitation  ONSET DATE: December 2024  SUBJECTIVE:   SUBJECTIVE STATEMENT: Pt states the ionto helped a little  PERTINENT HISTORY: None reported Pt states that at the end of last year he began having pain in his posterior heel. He says he had begun walking more and thinks that this may have caused some increased pain. He states that MD prescribed meds which helped but when he stopped taking the meds the pain returned. Pain increases when standing after prolonged sitting or sleeping. Pain will increase after walking > 30 minutes PAIN:  Are you having pain? Yes: NPRS scale: 1/10 currently, 8/10 at worst Pain location: Lt achilles insertion Pain description: sore Aggravating factors: standing and walking after prolonged sitting or lying Relieving factors: meds, rest  PRECAUTIONS: None  RED FLAGS: None   WEIGHT BEARING RESTRICTIONS: No  FALLS:  Has patient fallen in last 6 months? 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  DIAGNOSTIC FINDINGS: Lt foot x ray: 1. Mild great toe metatarsophalangeal joint osteoarthritis. 2. Mild chronic enthesopathic change at the Achilles insertion on the calcaneus.    COGNITION: Overall cognitive status: Within functional limits for tasks assessed     SENSATION: WFL  EDEMA:  None noted on eval   PALPATION: TTP Lt achilles insertion No muscle spasticity noted in calf musculature  Hypomobile TC mobs  LOWER EXTREMITY ROM:  Active ROM Right eval Left eval  Hip flexion    Hip  extension    Hip abduction    Hip adduction    Hip internal rotation    Hip external rotation    Knee flexion    Knee extension    Ankle dorsiflexion 2 2  Ankle plantarflexion 75 74  Ankle inversion 29 31 - pain end range  Ankle eversion 8 10   (Blank rows = not tested)  LOWER EXTREMITY MMT:  MMT Right eval Left eval  Hip flexion    Hip extension    Hip abduction    Hip adduction    Hip internal rotation    Hip external rotation    Great toe flexion 4 4  Great toe extension 4 4 - pain  Ankle dorsiflexion 4+ 4  Ankle plantarflexion 4 4-  Ankle inversion 4+ 4  Ankle eversion 4 4   (Blank rows = not tested)    FUNCTIONAL TESTS:  SLS: Rt > 20 seconds, Lt 9 seconds Increased pain with eccentric lowering from heel raise  GAIT: Distance walked: 50' Assistive device utilized: None Level of assistance: Complete Independence Comments:  increased supination bilat feet                                                                                                                                [facility] Adult PT Treatment:                                                DATE: 01/12/24 Therapeutic Exercise/NMR: Rocker board laterally x 1 min, A/P x 1 min SLS with arch unsupported SL heel raise on Lt 2 LEs up, Lt LE only down for heel raises Wt shifts focus on Lt toe off --> Runner's step up Lunge Rt foot forward focus on wt shifts Seated soleus calf raise 40#KB Manual Therapy: IASTM and STM Lt achilles, gastroc, soleus  Modalities: Ionto 4 hr patch 1ml dexamethasone    [facility] Adult PT Treatment:                                                DATE: 01/11/24 Therapeutic Exercise: Eccentric heel lowering off step x 12 Heel raises toes out, toes in, x 12  SLS on ground --> on foam Standing Gastroc stretch x 30 sec Standing Soleus stretch x 30 sec Seated toe ext x 12 Toe splay x 10 Arch lift x 10 Inversion/eversion green TB x 12  Manual Therapy: STM Lt  gastroc  Modalities: Ionto 4 hr patch 1ml dexamethasone    TREATMENT DATE: 12/29/23 See HEP Pt eduated on PT POC and goals, rationale for treatment, relevant anatomy    PATIENT EDUCATION:  Education details: PT POC and goals, HEP, ionto Person educated: Patient Education method: Explanation, Demonstration, and Handouts Education comprehension: verbalized understanding and returned demonstration  HOME EXERCISE PROGRAM: Access Code: [email or web address] URL: [email or web address] Date: 01/12/2024 Prepared by: Darice Conine  Exercises - Soleus Stretch on Wall  - 1 x daily - 7 x weekly - 1 sets - 3 reps - 20-30 seconds hold - Gastroc Stretch on Wall  - 1 x daily - 7 x weekly - 1 sets - 3 reps - 20-30 seconds hold - Arch Lifting  - 1 x daily - 7 x weekly - 3 sets - 10 reps - Single Leg Heel Raise  - 1 x daily - 7 x weekly - 3 sets - 10 reps - Standard Lunge  - 1 x daily - 7 x weekly - 3 sets - 10 reps  ASSESSMENT:  CLINICAL IMPRESSION: Pt with increased pain with mid stance and toe off phase of gait. Incorporated SL eccentric heel raises and lunges to progress strength with less pain irritability. Updated HEP and applied 2nd ionto patch  OBJECTIVE IMPAIRMENTS: decreased activity tolerance, decreased ROM, decreased strength, hypomobility,  and pain.      GOALS: Goals reviewed with patient? Yes  SHORT TERM GOALS: Target date: 01/26/2024   Pt will be independent in initial HEP Baseline: Goal status: INITIAL  2.  Pt will report pain <= 5/10 after standing from prolonged sitting or sleeping Baseline:  Goal status: INITIAL    LONG TERM GOALS: Target date: 02/23/2024    Pt will be independent with advanced HEP Baseline:  Goal status: INITIAL  2.  Pt will walk  with pain <= 2/10 after prolonged sitting or sleep  Baseline:  Goal status: INITIAL  3.  Pt will tolerate Lt SLS x 20 seconds without increase in pain Baseline:  Goal status: INITIAL    PLAN:  PT  FREQUENCY: 1-2x/week  PT DURATION: 8 weeks  PLANNED INTERVENTIONS: 97164- PT Re-evaluation, 97110-Therapeutic exercises, 97530- Therapeutic activity, 97112- Neuromuscular re-education, 97535- Self Care, 02859- Manual therapy, [id number]- Aquatic Therapy, H9716- Electrical stimulation (unattended), 97016- Vasopneumatic device, [id number]- Ultrasound, 02966- Ionotophoresis 4mg /ml Dexamethasone , Patient/Family education, Balance training, Stair training, Taping, Cryotherapy, and Moist heat  PLAN FOR NEXT SESSION: ankle eccentrics, foot/toe strengthening   Khoury Siemon, PT 01/12/2024, 9:29 AM

## 2024-01-19 ENCOUNTER — Ambulatory Visit: Admitting: Physical Therapy

## 2024-01-19 ENCOUNTER — Encounter: Payer: Self-pay | Admitting: Physical Therapy

## 2024-01-19 DIAGNOSIS — R262 Difficulty in walking, not elsewhere classified: Secondary | ICD-10-CM

## 2024-01-19 DIAGNOSIS — G8929 Other chronic pain: Secondary | ICD-10-CM

## 2024-01-19 DIAGNOSIS — M25572 Pain in left ankle and joints of left foot: Secondary | ICD-10-CM | POA: Diagnosis not present

## 2024-01-19 NOTE — Therapy (Signed)
 OUTPATIENT PHYSICAL THERAPY LOWER EXTREMITY TREATMENT   Patient Name: Erik Cole MRN: [id number] DOB:[birth year], 53 y.o., male Today's Date: 01/19/2024  END OF SESSION:  PT End of Session - 01/19/24 0922     Visit Number 4    Number of Visits 16    Date for PT Re-Evaluation 02/23/24    PT Start Time 0844    PT Stop Time 0922    PT Time Calculation (min) 38 min    Activity Tolerance Patient tolerated treatment well    Behavior During Therapy [facility] for tasks assessed/performed             Past Medical History:  Diagnosis Date   Anxiety    mild, xanax  sparingly   Anxiety, mild 11/27/2010   Arthralgia of both hands 02/05/2021   Arthritis    generalized   De Quervain's disease (tenosynovitis) 01/08/2021   Dyslipidemia    Mainly hypertriglyceridemia     Fatigue 05/14/2021   GERD (gastroesophageal reflux disease)    hx of   Globus sensation 04/11/2013   IMO SNOMED Dx Update Oct 2024     Goiter    Healthcare maintenance 12/31/2011   History of chicken pox    History of colonic diverticulitis 04/17/2015   HLD (hyperlipidemia)    previous PCP stopped tricor  2/2 elevated LFTs 07/2010   Hot flashes 05/14/2021   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   Low serum cortisol level 08/11/2021   Low testosterone  08/11/2021   Lymphocytosis 01/08/2021   Myalgia 05/14/2021   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   Obesity    Obesity, Class I, BMI 30-34.9 07/02/2011   Olecranon bursitis of both elbows 07/24/2021   Paresthesia of both hands 05/14/2021   Patellofemoral pain syndrome of right knee 03/12/2021   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   Snoring 07/24/2015   Thyroid  disease    Past Surgical History:  Procedure Laterality Date   COLONOSCOPY  10/2015   one polyp,  diverticulosis, rpt 5 yrs ([facility])   CRANIOTOMY N/A 11/05/2021   Procedure: 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;  Surgeon: Cheryle Debby LABOR, MD;  Location: [facility] OR;  Service: Neurosurgery;  Laterality: N/A;   GANGLION CYST EXCISION Right 2008    wrist   PLACEMENT OF LUMBAR DRAIN N/A 11/05/2021   Procedure: PLACEMENT OF LUMBAR DRAIN;  Surgeon: Cheryle Debby LABOR, MD;  Location: [facility] OR;  Service: Neurosurgery;  Laterality: N/A;   TRANSPHENOIDAL APPROACH EXPOSURE N/A 11/05/2021   Procedure: TRANSPHENOIDAL APPROACH EXPOSURE;  Surgeon: Mable Lenis, MD;  Location: [facility] OR;  Service: ENT;  Laterality: N/A;   Patient Active Problem List   Diagnosis Date Noted   Obesity (BMI 30.0-34.9) 10/22/2023   Palpitations 10/22/2023   Cardiac murmur 10/22/2023   Anxiety    Arthritis    Goiter    History of chicken pox    HLD (hyperlipidemia)    Obesity    Thyroid  disease    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   Low serum cortisol level 08/11/2021   Low testosterone  08/11/2021   Olecranon bursitis of both elbows 07/24/2021   Myalgia 05/14/2021   Fatigue 05/14/2021   Paresthesia of both  hands 05/14/2021   Hot flashes 05/14/2021   Patellofemoral pain syndrome of right knee 03/12/2021   Arthralgia of both hands 02/05/2021   De Quervain's disease (tenosynovitis) 01/08/2021   Lymphocytosis 01/08/2021   Snoring 07/24/2015   History of colonic diverticulitis 04/17/2015   Nontoxic multinodular goiter 07/20/2014   Globus sensation 04/11/2013   Healthcare maintenance 12/31/2011   Obesity, Class I, BMI 30-34.9 07/02/2011   Anxiety, mild 11/27/2010   Dyslipidemia    GERD (gastroesophageal reflux disease)     PCP: [facility] PROVIDER: Joya Stabs  REFERRING DIAG: Lt achilles tendonitis  THERAPY DIAG:  Chronic pain of left ankle  Difficulty in walking, not elsewhere classified  Rationale for Evaluation and Treatment: Rehabilitation  ONSET DATE: December 2024  SUBJECTIVE:   SUBJECTIVE STATEMENT: Pt states his ankle was irritated with ankle eccentrics. He states no real change in pain since starting PT  PERTINENT HISTORY: None reported Pt states that at the end of last year he began having pain in his posterior heel. He says he had begun walking more and thinks that this may have caused some increased pain. He states that MD prescribed meds which helped but when he stopped taking the meds the pain returned. Pain increases when standing after prolonged sitting or sleeping. Pain will increase after walking > 30 minutes PAIN:  Are you having pain? Yes: NPRS scale: 1/10 currently, 8/10 at worst Pain location: Lt achilles insertion Pain description: sore Aggravating factors: standing and walking after prolonged sitting or lying Relieving factors: meds, rest  PRECAUTIONS: None  RED FLAGS: None   WEIGHT BEARING RESTRICTIONS: No  FALLS:  Has patient fallen in last 6 months? 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  DIAGNOSTIC FINDINGS: Lt foot x ray: 1. Mild great toe metatarsophalangeal joint osteoarthritis. 2. Mild chronic enthesopathic change at the Achilles insertion on the calcaneus.    COGNITION: Overall cognitive status: Within functional limits for tasks assessed     SENSATION: WFL  EDEMA:  None noted on eval   PALPATION: TTP Lt achilles insertion No muscle spasticity noted in calf musculature  Hypomobile TC mobs  LOWER EXTREMITY  ROM:  Active ROM Right eval Left eval  Hip flexion    Hip extension    Hip abduction    Hip adduction    Hip internal rotation    Hip external rotation    Knee flexion    Knee extension    Ankle dorsiflexion 2 2  Ankle plantarflexion 75 74  Ankle inversion 29 31 - pain end range  Ankle eversion 8 10   (Blank rows = not tested)  LOWER EXTREMITY MMT:  MMT Right eval Left eval  Hip flexion    Hip extension    Hip abduction    Hip adduction    Hip internal rotation    Hip external rotation    Great toe flexion 4 4  Great toe extension 4 4 - pain  Ankle dorsiflexion 4+ 4  Ankle plantarflexion 4 4-  Ankle inversion 4+ 4  Ankle eversion 4 4   (Blank rows = not tested)    FUNCTIONAL TESTS:  SLS: Rt > 20 seconds, Lt 9 seconds Increased pain with eccentric lowering from heel raise  GAIT: Distance walked: 19' Assistive  device utilized: None Level of assistance: Complete Independence Comments: increased supination bilat feet                                                                                                                                [facility] Adult PT Treatment:                                                DATE: 01/18/24 Therapeutic Exercise/Activity: SL calf raise on leg press 65# 3 x 10 DL up, SL down heel raise x 15 Heel raises with ball between heels x 10 SL pallof 10# x 10 bilat Side step red TB around feet Pt education on relevant anatomy, rationale for treatment, predicted outcomes of treatment   [facility] Adult PT Treatment:                                                DATE: 01/12/24 Therapeutic Exercise/NMR: Rocker board laterally x 1 min, A/P x 1 min SLS with arch unsupported SL heel raise on Lt 2 LEs up, Lt LE only down for heel raises Wt shifts focus on Lt toe off --> Runner's step up Lunge Rt foot forward focus on wt shifts Seated soleus calf raise 40#KB Manual Therapy: IASTM and STM Lt achilles, gastroc, soleus  Modalities: Ionto 4  hr patch 1ml dexamethasone    [facility] Adult PT Treatment:                                                DATE: 01/11/24 Therapeutic Exercise: Eccentric heel lowering off step x 12 Heel raises toes out, toes in, x 12  SLS on ground --> on foam Standing Gastroc stretch x 30 sec Standing Soleus stretch x 30 sec Seated toe ext x 12 Toe splay x 10 Arch lift x 10 Inversion/eversion green TB x 12  Manual Therapy: STM Lt gastroc  Modalities: Ionto 4 hr patch 1ml dexamethasone    PATIENT EDUCATION:  Education details: PT POC and goals, HEP, ionto Person educated: Patient Education method: Programmer, multimedia, Facilities manager, and Handouts Education comprehension: verbalized understanding and returned demonstration  HOME EXERCISE PROGRAM: Access Code: [email or web address] URL: [email or web address] Date: 01/19/2024 Prepared by: Darice Conine  Exercises - Soleus Stretch on Wall  - 1 x daily - 7 x weekly - 1 sets - 3 reps - 20-30 seconds hold - Gastroc Stretch on Wall  - 1 x daily - 7 x weekly - 1 sets - 3 reps - 20-30 seconds hold - Arch Lifting  - 1 x daily - 7  x weekly - 3 sets - 10 reps - Single Leg Heel Raise  - 1 x daily - 7 x weekly - 3 sets - 10 reps - Standard Lunge  - 1 x daily - 7 x weekly - 3 sets - 10 reps - Single-Leg Anti-Rotation Press With Anchored Resistance  - 1 x daily - 7 x weekly - 3 sets - 10 reps - Side Stepping with Resistance at Feet  - 1 x daily - 7 x weekly - 3 sets - 10 reps  ASSESSMENT:  CLINICAL IMPRESSION: Pt continues with increased pain at achilles insertion with push off and mid stance phase especially on stairs. Focused todays session on foot and calf strengthening and stability. Updated HEP. Advised pt to try to take  medication that was prescribed by MD to reduce irritation to better tolerate exercise  OBJECTIVE IMPAIRMENTS: decreased activity tolerance, decreased ROM, decreased strength, hypomobility, and pain.      GOALS: Goals reviewed with  patient? Yes  SHORT TERM GOALS: Target date: 01/26/2024   Pt will be independent in initial HEP Baseline: Goal status: INITIAL  2.  Pt will report pain <= 5/10 after standing from prolonged sitting or sleeping Baseline:  Goal status: INITIAL    LONG TERM GOALS: Target date: 02/23/2024    Pt will be independent with advanced HEP Baseline:  Goal status: INITIAL  2.  Pt will walk  with pain <= 2/10 after prolonged sitting or sleep  Baseline:  Goal status: INITIAL  3.  Pt will tolerate Lt SLS x 20 seconds without increase in pain Baseline:  Goal status: INITIAL    PLAN:  PT FREQUENCY: 1-2x/week  PT DURATION: 8 weeks  PLANNED INTERVENTIONS: 97164- PT Re-evaluation, 97110-Therapeutic exercises, 97530- Therapeutic activity, [id number]- Neuromuscular re-education, 97535- Self Care, [id number]- Manual therapy, [id number]- Aquatic Therapy, H9716- Electrical stimulation (unattended), 97016- Vasopneumatic device, [id number]- Ultrasound, 02966- Ionotophoresis 4mg /ml Dexamethasone , Patient/Family education, Balance training, Stair training, Taping, Cryotherapy, and Moist heat  PLAN FOR NEXT SESSION: ankle eccentrics, foot/toe strengthening   Lonnette Shrode, PT 01/19/2024, 9:22 AM

## 2024-01-21 ENCOUNTER — Encounter: Payer: Self-pay | Admitting: Physical Therapy

## 2024-01-21 ENCOUNTER — Ambulatory Visit: Admitting: Physical Therapy

## 2024-01-21 DIAGNOSIS — M25572 Pain in left ankle and joints of left foot: Secondary | ICD-10-CM | POA: Diagnosis not present

## 2024-01-21 DIAGNOSIS — G8929 Other chronic pain: Secondary | ICD-10-CM

## 2024-01-21 DIAGNOSIS — R262 Difficulty in walking, not elsewhere classified: Secondary | ICD-10-CM

## 2024-01-21 NOTE — Therapy (Signed)
 OUTPATIENT PHYSICAL THERAPY LOWER EXTREMITY TREATMENT   Patient Name: Erik Cole MRN: [id number] DOB:12-24-1970, 53 y.o., male Today's Date: 01/21/2024  END OF SESSION:  PT End of Session - 01/21/24 0923     Visit Number 5    Number of Visits 16    Date for PT Re-Evaluation 02/23/24    PT Start Time 0845    PT Stop Time 0918    PT Time Calculation (min) 33 min    Activity Tolerance Patient tolerated treatment well    Behavior During Therapy [facility] for tasks assessed/performed              Past Medical History:  Diagnosis Date   Anxiety    mild, xanax  sparingly   Anxiety, mild 11/27/2010   Arthralgia of both hands 02/05/2021   Arthritis    generalized   De Quervain's disease (tenosynovitis) 01/08/2021   Dyslipidemia    Mainly hypertriglyceridemia     Fatigue 05/14/2021   GERD (gastroesophageal reflux disease)    hx of   Globus sensation 04/11/2013   IMO SNOMED Dx Update Oct 2024     Goiter    Healthcare maintenance 12/31/2011   History of chicken pox    History of colonic diverticulitis 04/17/2015   HLD (hyperlipidemia)    previous PCP stopped tricor  2/2 elevated LFTs 07/2010   Hot flashes 05/14/2021   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   Low serum cortisol level 08/11/2021   Low testosterone  08/11/2021   Lymphocytosis 01/08/2021   Myalgia 05/14/2021   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   Obesity    Obesity, Class I, BMI 30-34.9 07/02/2011   Olecranon bursitis of both elbows 07/24/2021   Paresthesia of both hands 05/14/2021   Patellofemoral pain syndrome of right knee 03/12/2021   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   Snoring 07/24/2015   Thyroid  disease    Past Surgical History:  Procedure Laterality Date   COLONOSCOPY  10/2015   one polyp,  diverticulosis, rpt 5 yrs ([facility])   CRANIOTOMY N/A 11/05/2021   Procedure: 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;  Surgeon: Cheryle Debby LABOR, MD;  Location: [facility] OR;  Service: Neurosurgery;  Laterality: N/A;   GANGLION CYST EXCISION Right 2008    wrist   PLACEMENT OF LUMBAR DRAIN N/A 11/05/2021   Procedure: PLACEMENT OF LUMBAR DRAIN;  Surgeon: Cheryle Debby LABOR, MD;  Location: [facility] OR;  Service: Neurosurgery;  Laterality: N/A;   TRANSPHENOIDAL APPROACH EXPOSURE N/A 11/05/2021   Procedure: TRANSPHENOIDAL APPROACH EXPOSURE;  Surgeon: Mable Lenis, MD;  Location: [facility] OR;  Service: ENT;  Laterality: N/A;   Patient Active Problem List   Diagnosis Date Noted   Obesity (BMI 30.0-34.9) 10/22/2023   Palpitations 10/22/2023   Cardiac murmur 10/22/2023   Anxiety    Arthritis    Goiter    History of chicken pox    HLD (hyperlipidemia)    Obesity    Thyroid  disease    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   Low serum cortisol level 08/11/2021   Low testosterone  08/11/2021   Olecranon bursitis of both elbows 07/24/2021   Myalgia 05/14/2021   Fatigue 05/14/2021   Paresthesia of  both hands 05/14/2021   Hot flashes 05/14/2021   Patellofemoral pain syndrome of right knee 03/12/2021   Arthralgia of both hands 02/05/2021   De Quervain's disease (tenosynovitis) 01/08/2021   Lymphocytosis 01/08/2021   Snoring 07/24/2015   History of colonic diverticulitis 04/17/2015   Nontoxic multinodular goiter 07/20/2014   Globus sensation 04/11/2013   Healthcare maintenance 12/31/2011   Obesity, Class I, BMI 30-34.9 07/02/2011   Anxiety, mild 11/27/2010   Dyslipidemia    GERD (gastroesophageal reflux disease)     PCP: [facility] [place] [facility] PROVIDER: Joya Stabs  REFERRING DIAG: Lt achilles tendonitis  THERAPY DIAG:  Chronic pain of left ankle  Difficulty in walking, not elsewhere classified  Rationale for Evaluation and Treatment: Rehabilitation  ONSET DATE: December 2024  SUBJECTIVE:   SUBJECTIVE STATEMENT: Pt states he has been taking medication prescribed by MD and that he has no real pain. He states he can be aware of it if he really thinks about it but is overall having no pain  PERTINENT HISTORY: None reported Pt states that at the end of last year he began having pain in his posterior heel. He says he had begun walking more and thinks that this may have caused some increased pain. He states that MD prescribed meds which helped but when he stopped taking the meds the pain returned. Pain increases when standing after prolonged sitting or sleeping. Pain will increase after walking > 30 minutes PAIN:  Are you having pain? Yes: NPRS scale: 0/10 currently, 2/10 at worst Pain location: Lt achilles insertion Pain description: sore Aggravating factors: standing and walking after prolonged sitting or lying Relieving factors: meds, rest  PRECAUTIONS: None  RED FLAGS: None   WEIGHT BEARING RESTRICTIONS: No  FALLS:  Has patient fallen in last 6 months? 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  DIAGNOSTIC FINDINGS: Lt foot x ray: 1. Mild great toe metatarsophalangeal joint osteoarthritis. 2. Mild chronic enthesopathic change at the Achilles insertion on the calcaneus.    COGNITION: Overall cognitive status: Within functional limits for tasks assessed     SENSATION: WFL  EDEMA:  None noted on eval   PALPATION: TTP Lt achilles insertion No muscle spasticity  noted in calf musculature  Hypomobile TC mobs  LOWER EXTREMITY ROM:  Active ROM Right eval Left eval  Hip flexion    Hip extension    Hip abduction    Hip adduction    Hip internal rotation    Hip external rotation    Knee flexion    Knee extension    Ankle dorsiflexion 2 2  Ankle plantarflexion 75 74  Ankle inversion 29 31 - pain end range  Ankle eversion 8 10   (Blank rows = not tested)  LOWER EXTREMITY MMT:  MMT Right eval Left eval  Hip flexion    Hip extension    Hip abduction    Hip adduction    Hip internal rotation    Hip external rotation    Great toe flexion 4 4  Great toe extension 4 4 - pain  Ankle dorsiflexion 4+ 4  Ankle plantarflexion 4 4-  Ankle inversion 4+ 4  Ankle eversion 4 4   (Blank rows = not tested)    FUNCTIONAL TESTS:  SLS: Rt >  20 seconds, Lt 9 seconds Increased pain with eccentric lowering from heel raise  GAIT: Distance walked: 50' Assistive device utilized: None Level of assistance: Complete Independence Comments: increased supination bilat feet                                                                                                                                [facility] Adult PT Treatment:                                                DATE: 01/21/24 Therapeutic Exercise/Activity: DL up, SL down heel raise x 15 DL heel raise/lower off step x 15 Lunge Lt foot in back x 10 Runners step up focus on push off on Lt foot Rocker board A/P x 1 min SL heel raise x 10 SL pallof 10# x 10 bilat Side step red TB around feet Stair negotiation in stairwell taking 2 steps at a time (this is what pt does at work) - no pain Manual Therapy: STM Lt achilles, gastroc PROM Lt ankle all directions   [facility] Adult PT Treatment:                                                DATE: 01/18/24 Therapeutic Exercise/Activity: SL calf raise on leg press 65# 3 x 10 DL up, SL down heel raise x 15 Heel raises with ball between heels x 10 SL pallof  10# x 10 bilat Side step red TB around feet Pt education on relevant anatomy, rationale for treatment, predicted outcomes of treatment   [facility] Adult PT Treatment:                                                DATE: 01/12/24 Therapeutic Exercise/NMR: Rocker board laterally x 1 min, A/P x 1 min SLS with arch unsupported SL heel raise on Lt 2 LEs up, Lt LE only down for heel raises Wt shifts focus on Lt toe off --> Runner's step up Lunge Rt foot forward focus on wt shifts Seated soleus calf raise 40#KB Manual Therapy: IASTM and STM Lt achilles, gastroc, soleus  Modalities: Ionto 4 hr patch 1ml dexamethasone      PATIENT EDUCATION:  Education details: PT POC and goals, HEP, ionto Person educated: Patient Education method: Programmer, multimedia, Demonstration, and Handouts Education comprehension: verbalized understanding and returned demonstration  HOME EXERCISE PROGRAM: Access Code: [email or web address] URL: [email or web address] Date: 01/19/2024 Prepared by: Darice Conine  Exercises - Soleus Stretch on Wall  - 1 x daily - 7 x weekly - 1 sets -  3 reps - 20-30 seconds hold - Gastroc Stretch on Wall  - 1 x daily - 7 x weekly - 1 sets - 3 reps - 20-30 seconds hold - Arch Lifting  - 1 x daily - 7 x weekly - 3 sets - 10 reps - Single Leg Heel Raise  - 1 x daily - 7 x weekly - 3 sets - 10 reps - Standard Lunge  - 1 x daily - 7 x weekly - 3 sets - 10 reps - Single-Leg Anti-Rotation Press With Anchored Resistance  - 1 x daily - 7 x weekly - 3 sets - 10 reps - Side Stepping with Resistance at Feet  - 1 x daily - 7 x weekly - 3 sets - 10 reps  ASSESSMENT:  CLINICAL IMPRESSION: Pt better able to tolerate exercises since he is taking medication as prescribed by MD. Progressed strengthening with decreased rest breaks and decreased pain today. Continued to encourage pt to perform strengthening and mobility exercises to take advantage of decreased pain while on medication  OBJECTIVE  IMPAIRMENTS: decreased activity tolerance, decreased ROM, decreased strength, hypomobility, and pain.      GOALS: Goals reviewed with patient? Yes  SHORT TERM GOALS: Target date: 01/26/2024   Pt will be independent in initial HEP Baseline: Goal status: MET  2.  Pt will report pain <= 5/10 after standing from prolonged sitting or sleeping Baseline:  Goal status: INITIAL    LONG TERM GOALS: Target date: 02/23/2024    Pt will be independent with advanced HEP Baseline:  Goal status: INITIAL  2.  Pt will walk  with pain <= 2/10 after prolonged sitting or sleep  Baseline:  Goal status: INITIAL  3.  Pt will tolerate Lt SLS x 20 seconds without increase in pain Baseline:  Goal status: INITIAL    PLAN:  PT FREQUENCY: 1-2x/week  PT DURATION: 8 weeks  PLANNED INTERVENTIONS: 97164- PT Re-evaluation, 97110-Therapeutic exercises, 97530- Therapeutic activity, 97112- Neuromuscular re-education, 97535- Self Care, 02859- Manual therapy, [id number]- Aquatic Therapy, H9716- Electrical stimulation (unattended), 97016- Vasopneumatic device, [id number]- Ultrasound, [id number]- Ionotophoresis 4mg /ml Dexamethasone , Patient/Family education, Balance training, Stair training, Taping, Cryotherapy, and Moist heat  PLAN FOR NEXT SESSION: ankle eccentrics, foot/toe strengthening   Kerigan Narvaez, PT 01/21/2024, 9:23 AM

## 2024-01-26 ENCOUNTER — Ambulatory Visit: Admitting: Physical Therapy

## 2024-01-26 ENCOUNTER — Encounter: Payer: Self-pay | Admitting: Physical Therapy

## 2024-01-26 DIAGNOSIS — G8929 Other chronic pain: Secondary | ICD-10-CM

## 2024-01-26 DIAGNOSIS — M25572 Pain in left ankle and joints of left foot: Secondary | ICD-10-CM | POA: Diagnosis not present

## 2024-01-26 DIAGNOSIS — R262 Difficulty in walking, not elsewhere classified: Secondary | ICD-10-CM

## 2024-01-26 NOTE — Therapy (Signed)
 OUTPATIENT PHYSICAL THERAPY LOWER EXTREMITY TREATMENT   Patient Name: Erik Cole MRN: [id number] DOB:[birth year], 53 y.o., male Today's Date: 01/26/2024  END OF SESSION:  PT End of Session - 01/26/24 1524     Visit Number 6    Number of Visits 16    Date for PT Re-Evaluation 02/23/24    Authorization Type BCBS    PT Start Time 1445    PT Stop Time 1523    PT Time Calculation (min) 38 min    Activity Tolerance Patient tolerated treatment well    Behavior During Therapy WFL for tasks assessed/performed               Past Medical History:  Diagnosis Date   Anxiety    mild, xanax  sparingly   Anxiety, mild 11/27/2010   Arthralgia of both hands 02/05/2021   Arthritis    generalized   De Quervain's disease (tenosynovitis) 01/08/2021   Dyslipidemia    Mainly hypertriglyceridemia     Fatigue 05/14/2021   GERD (gastroesophageal reflux disease)    hx of   Globus sensation 04/11/2013   IMO SNOMED Dx Update Oct 2024     Goiter    Healthcare maintenance 12/31/2011   History of chicken pox    History of colonic diverticulitis 04/17/2015   HLD (hyperlipidemia)    previous PCP stopped tricor  2/2 elevated LFTs 07/2010   Hot flashes 05/14/2021   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   Low serum cortisol level 08/11/2021   Low testosterone  08/11/2021   Lymphocytosis 01/08/2021   Myalgia 05/14/2021   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   Obesity    Obesity, Class I, BMI 30-34.9 07/02/2011   Olecranon bursitis of both elbows 07/24/2021   Paresthesia of both hands 05/14/2021   Patellofemoral pain syndrome of right knee 03/12/2021   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   Snoring 07/24/2015   Thyroid  disease    Past Surgical History:  Procedure Laterality Date   COLONOSCOPY   10/2015   one polyp, diverticulosis, rpt 5 yrs ([facility])   CRANIOTOMY N/A 11/05/2021   Procedure: 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;  Surgeon: Cheryle Debby LABOR, MD;  Location: [facility] OR;  Service: Neurosurgery;  Laterality: N/A;   GANGLION CYST EXCISION Right 2008    wrist   PLACEMENT OF LUMBAR DRAIN N/A 11/05/2021   Procedure: PLACEMENT OF LUMBAR DRAIN;  Surgeon: Cheryle Debby LABOR, MD;  Location: [facility] OR;  Service: Neurosurgery;  Laterality: N/A;   TRANSPHENOIDAL APPROACH EXPOSURE N/A 11/05/2021   Procedure: TRANSPHENOIDAL APPROACH EXPOSURE;  Surgeon: Mable Lenis, MD;  Location: [facility] OR;  Service: ENT;  Laterality: N/A;   Patient Active Problem List   Diagnosis Date Noted   Obesity (BMI 30.0-34.9) 10/22/2023   Palpitations 10/22/2023   Cardiac murmur 10/22/2023   Anxiety    Arthritis    Goiter    History of chicken pox    HLD (hyperlipidemia)    Obesity    Thyroid  disease    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9 07/02/2011   Anxiety, mild 11/27/2010   Dyslipidemia    GERD (gastroesophageal reflux disease)      PCP: Dorina Dallas [facility] PROVIDER: Joya Stabs  REFERRING DIAG: Lt achilles tendonitis  THERAPY DIAG:  Chronic pain of left ankle  Difficulty in walking, not elsewhere classified  Rationale for Evaluation and Treatment: Rehabilitation  ONSET DATE: December 2024  SUBJECTIVE:   SUBJECTIVE STATEMENT: Pt state that he continues to have hardly any pain. He has been taking meds 2 x a day,  took meds this morning and plans to cut back to 1x a day starting today.  PERTINENT HISTORY: None reported Pt states that at the end of last year he began having pain in his posterior heel. He says he had begun walking more and thinks that this may have caused some increased pain. He states that MD prescribed meds which helped but when he stopped taking the meds the pain returned. Pain increases when standing after prolonged sitting or sleeping. Pain will increase after walking > 30 minutes PAIN:  Are you having pain? Yes: NPRS scale: 0/10 currently, 2/10 at worst Pain location: Lt achilles insertion Pain description: sore Aggravating factors: standing and walking after prolonged sitting or lying Relieving factors: meds, rest  PRECAUTIONS: None  RED FLAGS: None   WEIGHT BEARING RESTRICTIONS: No  FALLS:  Has patient fallen in last 6 months? 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  DIAGNOSTIC FINDINGS: Lt foot x ray: 1. Mild great toe metatarsophalangeal joint osteoarthritis. 2. Mild chronic enthesopathic change at the Achilles insertion on the calcaneus.    COGNITION: Overall cognitive status: Within functional limits for tasks assessed     SENSATION: WFL  EDEMA:  None noted on eval   PALPATION: TTP Lt achilles insertion No muscle spasticity  noted in calf musculature  Hypomobile TC mobs  LOWER EXTREMITY ROM:  Active ROM Right eval Left eval Left 7/16  Hip flexion     Hip extension     Hip abduction     Hip adduction     Hip internal rotation     Hip external rotation     Knee flexion     Knee extension     Ankle dorsiflexion 2 2 4   Ankle plantarflexion 75 74 75  Ankle inversion 29 31 - pain end range 40  Ankle eversion 8 10 15    (Blank rows = not tested)  LOWER EXTREMITY MMT:  MMT Right eval Left eval Left 01/26/24  Hip flexion     Hip extension     Hip abduction     Hip adduction     Hip internal rotation     Hip external rotation     Great toe flexion 4 4 4   Great toe extension 4 4 - pain 4  Ankle dorsiflexion 4+ 4 4+  Ankle  plantarflexion 4 4- 4+  Ankle inversion 4+ 4 4+  Ankle eversion 4 4 4+   (Blank rows = not tested)    FUNCTIONAL TESTS:  SLS: Rt > 20 seconds, Lt 9 seconds Increased pain with eccentric lowering from heel raise  GAIT: Distance walked: 50' Assistive device utilized: None Level of assistance: Complete Independence Comments: increased supination bilat feet                                                                                                                                [facility] Adult PT Treatment:                                                DATE: 01/26/24 Therapeutic Exercise/Activity: ROM and strength measurements Rocker board laterally x 1 min Step up/over Bosu ball Lateral lunge onto Bosu ball Squat on upside down Bosu Runners step up 10'' step Walking up/down steep incline outdoors   [facility] Adult PT Treatment:                                                DATE: 01/21/24 Therapeutic Exercise/Activity: DL up, SL down heel raise x 15 DL heel raise/lower off step x 15 Lunge Lt foot in back x 10 Runners step up focus on push off on Lt foot Rocker board A/P x 1 min SL heel raise x 10 SL pallof 10# x 10 bilat Side step red TB around feet Stair negotiation  in stairwell taking 2 steps at a time (this is what pt does at work) - no pain Manual Therapy: STM Lt achilles, gastroc PROM Lt ankle all directions   [facility] Adult PT Treatment:                                                DATE: 01/18/24 Therapeutic Exercise/Activity: SL calf raise on leg press 65# 3 x 10 DL up, SL down heel raise x 15 Heel raises with ball between heels x 10 SL pallof 10# x 10 bilat Side step red TB around feet Pt education on relevant anatomy, rationale for treatment, predicted outcomes of treatment      PATIENT EDUCATION:  Education details: PT POC and goals, HEP, ionto Person educated: Patient Education method: Explanation, Demonstration, and Handouts Education comprehension: verbalized understanding and returned demonstration  HOME EXERCISE PROGRAM: Access Code: [email or web address] URL: [email or web address] Date: 01/19/2024 Prepared by: Darice Conine  Exercises - Soleus Stretch on Wall  - 1 x daily - 7 x weekly - 1 sets - 3 reps -  20-30 seconds hold - Gastroc Stretch on Wall  - 1 x daily - 7 x weekly - 1 sets - 3 reps - 20-30 seconds hold - Arch Lifting  - 1 x daily - 7 x weekly - 3 sets - 10 reps - Single Leg Heel Raise  - 1 x daily - 7 x weekly - 3 sets - 10 reps - Standard Lunge  - 1 x daily - 7 x weekly - 3 sets - 10 reps - Single-Leg Anti-Rotation Press With Anchored Resistance  - 1 x daily - 7 x weekly - 3 sets - 10 reps - Side Stepping with Resistance at Feet  - 1 x daily - 7 x weekly - 3 sets - 10 reps  ASSESSMENT:  CLINICAL IMPRESSION: Tried advanced exercises with pt able to perform all without increase in pain. Pt has improved ankle ROM and strength since eval  OBJECTIVE IMPAIRMENTS: decreased activity tolerance, decreased ROM, decreased strength, hypomobility, and pain.      GOALS: Goals reviewed with patient? Yes  SHORT TERM GOALS: Target date: 01/26/2024   Pt will be independent in initial HEP Baseline: Goal status:  MET  2.  Pt will report pain <= 5/10 after standing from prolonged sitting or sleeping Baseline:  Goal status: MET    LONG TERM GOALS: Target date: 02/23/2024    Pt will be independent with advanced HEP Baseline:  Goal status: INITIAL  2.  Pt will walk  with pain <= 2/10 after prolonged sitting or sleep  Baseline:  Goal status: INITIAL  3.  Pt will tolerate Lt SLS x 20 seconds without increase in pain Baseline:  Goal status: INITIAL    PLAN:  PT FREQUENCY: 1-2x/week  PT DURATION: 8 weeks  PLANNED INTERVENTIONS: 97164- PT Re-evaluation, 97110-Therapeutic exercises, 97530- Therapeutic activity, 97112- Neuromuscular re-education, 97535- Self Care, 02859- Manual therapy, [id number]- Aquatic Therapy, H9716- Electrical stimulation (unattended), 97016- Vasopneumatic device, [id number]- Ultrasound, 02966- Ionotophoresis 4mg /ml Dexamethasone , Patient/Family education, Balance training, Stair training, Taping, Cryotherapy, and Moist heat  PLAN FOR NEXT SESSION: ankle eccentrics, foot/toe strengthening   Nichele Slawson, PT 01/26/2024, 3:24 PM

## 2024-01-28 ENCOUNTER — Ambulatory Visit: Admitting: Physical Therapy

## 2024-01-28 ENCOUNTER — Encounter: Payer: Self-pay | Admitting: Physical Therapy

## 2024-01-28 DIAGNOSIS — G8929 Other chronic pain: Secondary | ICD-10-CM

## 2024-01-28 DIAGNOSIS — R262 Difficulty in walking, not elsewhere classified: Secondary | ICD-10-CM

## 2024-01-28 DIAGNOSIS — M25572 Pain in left ankle and joints of left foot: Secondary | ICD-10-CM | POA: Diagnosis not present

## 2024-01-28 NOTE — Therapy (Addendum)
 OUTPATIENT PHYSICAL THERAPY LOWER EXTREMITY TREATMENT AND DISCHARGE   Patient Name: Erik Cole MRN: [id number] DOB:03/07/1971, 53 y.o., male Today's Date: 01/28/2024  END OF SESSION:  PT End of Session - 01/28/24 0908     Visit Number 7    Number of Visits 16    Date for PT Re-Evaluation 02/23/24    Authorization Type BCBS    PT Start Time 0840    PT Stop Time 0908    PT Time Calculation (min) 28 min    Activity Tolerance Patient tolerated treatment well    Behavior During Therapy [facility] for tasks assessed/performed                Past Medical History:  Diagnosis Date   Anxiety    mild, xanax  sparingly   Anxiety, mild 11/27/2010   Arthralgia of both hands 02/05/2021   Arthritis    generalized   De Quervain's disease (tenosynovitis) 01/08/2021   Dyslipidemia    Mainly hypertriglyceridemia     Fatigue 05/14/2021   GERD (gastroesophageal reflux disease)    hx of   Globus sensation 04/11/2013   IMO SNOMED Dx Update Oct 2024     Goiter    Healthcare maintenance 12/31/2011   History of chicken pox    History of colonic diverticulitis 04/17/2015   HLD (hyperlipidemia)    previous PCP stopped tricor  2/2 elevated LFTs 07/2010   Hot flashes 05/14/2021   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   Low serum cortisol level 08/11/2021   Low testosterone  08/11/2021   Lymphocytosis 01/08/2021   Myalgia 05/14/2021   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   Obesity    Obesity, Class I, BMI 30-34.9 07/02/2011   Olecranon bursitis of both elbows 07/24/2021   Paresthesia of both hands 05/14/2021   Patellofemoral pain syndrome of right knee 03/12/2021   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   Snoring 07/24/2015   Thyroid  disease    Past Surgical History:  Procedure Laterality Date    COLONOSCOPY  10/2015   one polyp, diverticulosis, rpt 5 yrs ([facility])   CRANIOTOMY N/A 11/05/2021   Procedure: 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;  Surgeon: Cheryle Debby LABOR, MD;  Location: [facility] OR;  Service: Neurosurgery;  Laterality: N/A;   GANGLION CYST EXCISION Right 2008    wrist   PLACEMENT OF LUMBAR DRAIN N/A 11/05/2021   Procedure: PLACEMENT OF LUMBAR DRAIN;  Surgeon: Cheryle Debby LABOR, MD;  Location: [facility] OR;  Service: Neurosurgery;  Laterality: N/A;   TRANSPHENOIDAL APPROACH EXPOSURE N/A 11/05/2021   Procedure: TRANSPHENOIDAL APPROACH EXPOSURE;  Surgeon: Mable Lenis, MD;  Location: [facility] OR;  Service: ENT;  Laterality: N/A;   Patient Active Problem List   Diagnosis Date Noted   Obesity (BMI 30.0-34.9) 10/22/2023   Palpitations 10/22/2023   Cardiac murmur 10/22/2023   Anxiety    Arthritis    Goiter    History of chicken pox    HLD (hyperlipidemia)    Obesity    Thyroid  disease    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9 07/02/2011   Anxiety, mild 11/27/2010   Dyslipidemia    GERD (gastroesophageal reflux  disease)     PCP: Dorina Dallas [facility] PROVIDER: Joya Stabs  REFERRING DIAG: Lt achilles tendonitis  THERAPY DIAG:  Chronic pain of left ankle  Difficulty in walking, not elsewhere classified  Rationale for Evaluation and Treatment: Rehabilitation  ONSET DATE: December 2024  SUBJECTIVE:   SUBJECTIVE STATEMENT: Pt states he can feel his ankle a little more since beginning taking meds 1x a day. He was a little sore after last session  PERTINENT HISTORY: None reported Pt states that at the end of last year he began having pain in his posterior heel. He says he had begun walking more and thinks that this may have caused some increased pain. He states that MD prescribed meds which helped but when he stopped taking the meds the pain returned. Pain increases when standing after prolonged sitting or sleeping. Pain will increase after walking > 30 minutes PAIN:  Are you having pain? Yes: NPRS scale: 0/10 currently, 2/10 at worst Pain location: Lt achilles insertion Pain description: sore Aggravating factors: standing and walking after prolonged sitting or lying Relieving factors: meds, rest  PRECAUTIONS: None  RED FLAGS: None   WEIGHT BEARING RESTRICTIONS: No  FALLS:  Has patient fallen in last 6 months? 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  DIAGNOSTIC FINDINGS: Lt foot x ray: 1. Mild great toe metatarsophalangeal joint osteoarthritis. 2. Mild chronic enthesopathic change at the Achilles insertion on the calcaneus.    COGNITION: Overall cognitive status: Within functional limits for tasks assessed     SENSATION: WFL  EDEMA:  None noted on eval   PALPATION: TTP Lt achilles insertion No muscle spasticity noted in calf  musculature  Hypomobile TC mobs  LOWER EXTREMITY ROM:  Active ROM Right eval Left eval Left 7/16  Hip flexion     Hip extension     Hip abduction     Hip adduction     Hip internal rotation     Hip external rotation     Knee flexion     Knee extension     Ankle dorsiflexion 2 2 4   Ankle plantarflexion 75 74 75  Ankle inversion 29 31 - pain end range 40  Ankle eversion 8 10 15    (Blank rows = not tested)  LOWER EXTREMITY MMT:  MMT Right eval Left eval Left 01/26/24  Hip flexion     Hip extension     Hip abduction     Hip adduction     Hip internal rotation     Hip external rotation     Great toe flexion 4 4 4   Great toe extension 4 4 - pain 4  Ankle dorsiflexion 4+ 4 4+  Ankle plantarflexion 4 4- 4+  Ankle  inversion 4+ 4 4+  Ankle eversion 4 4 4+   (Blank rows = not tested)    FUNCTIONAL TESTS:  SLS: Rt > 20 seconds, Lt 9 seconds Increased pain with eccentric lowering from heel raise  GAIT: Distance walked: 50' Assistive device utilized: None Level of assistance: Complete Independence Comments: increased supination bilat feet                                                                                                                                [facility] Adult PT Treatment:                                                DATE: 01/28/24 Therapeutic Exercise/Activity: Step up and over Bosu ball Lateral lung on Bosu Squats on upside down Bosu Stairs 2 at a time 2 flights - very slight pain Steep incline outdoors - very slight pain SLS Lt LE 30 seconds no c/o pain Discussion of progress, recommendation to return to MD for next steps   [facility] Adult PT Treatment:                                                DATE: 01/26/24 Therapeutic Exercise/Activity: ROM and strength measurements Rocker board laterally x 1 min Step up/over Bosu ball Lateral lunge onto Bosu ball Squat on upside down Bosu Runners step up 10'' step Walking up/down steep incline  outdoors   [facility] Adult PT Treatment:                                                DATE: 01/21/24 Therapeutic Exercise/Activity: DL up, SL down heel raise x 15 DL heel raise/lower off step x 15 Lunge Lt foot in back x 10 Runners step up focus on push off on Lt foot Rocker board A/P x 1 min SL heel raise x 10 SL pallof 10# x 10 bilat Side step red TB around feet Stair negotiation in stairwell taking 2 steps at a time (this is what pt does at work) - no pain Manual Therapy: STM Lt achilles, gastroc PROM Lt ankle all directions      PATIENT EDUCATION:  Education details: PT POC and goals, HEP, ionto Person educated: Patient Education method: Explanation, Demonstration, and Handouts Education comprehension: verbalized understanding and returned demonstration  HOME EXERCISE PROGRAM: Access Code: [email or web address] URL: [email or web address] Date: 01/19/2024 Prepared by: Darice Conine  Exercises - Soleus Stretch on Wall  - 1 x daily - 7 x weekly - 1 sets - 3 reps - 20-30 seconds hold -  Gastroc Stretch on Wall  - 1 x daily - 7 x weekly - 1 sets - 3 reps - 20-30 seconds hold - Arch Lifting  - 1 x daily - 7 x weekly - 3 sets - 10 reps - Single Leg Heel Raise  - 1 x daily - 7 x weekly - 3 sets - 10 reps - Standard Lunge  - 1 x daily - 7 x weekly - 3 sets - 10 reps - Single-Leg Anti-Rotation Press With Anchored Resistance  - 1 x daily - 7 x weekly - 3 sets - 10 reps - Side Stepping with Resistance at Feet  - 1 x daily - 7 x weekly - 3 sets - 10 reps  ASSESSMENT:  CLINICAL IMPRESSION: Pt was able to complete all the same activities on 7/16 and today. 7/16 he was taking medication 2x a day, Today, taking medication 1x a day, he had very minimal pain. He states he can feel it but it is not bad. PT recommends pt return to MD for next steps. PT educated pt on recommendations for continued HEP, continuing ankle strength and mobility at home. Plan to hold PT at this time until pt  returns to MD  OBJECTIVE IMPAIRMENTS: decreased activity tolerance, decreased ROM, decreased strength, hypomobility, and pain.      GOALS: Goals reviewed with patient? Yes  SHORT TERM GOALS: Target date: 01/26/2024   Pt will be independent in initial HEP Baseline: Goal status: MET  2.  Pt will report pain <= 5/10 after standing from prolonged sitting or sleeping Baseline:  Goal status: MET    LONG TERM GOALS: Target date: 02/23/2024    Pt will be independent with advanced HEP Baseline:  Goal status: INITIAL  2.  Pt will walk  with pain <= 2/10 after prolonged sitting or sleep  Baseline:  Goal status: MET  3.  Pt will tolerate Lt SLS x 20 seconds without increase in pain Baseline:  Goal status: MET    PLAN:  PT FREQUENCY: 1-2x/week  PT DURATION: 8 weeks  PLANNED INTERVENTIONS: 97164- PT Re-evaluation, 97110-Therapeutic exercises, 97530- Therapeutic activity, 97112- Neuromuscular re-education, 97535- Self Care, 02859- Manual therapy, [id number]- Aquatic Therapy, H9716- Electrical stimulation (unattended), 97016- Vasopneumatic device, 97035- Ultrasound, 02966- Ionotophoresis 4mg /ml Dexamethasone , Patient/Family education, Balance training, Stair training, Taping, Cryotherapy, and Moist heat  PLAN FOR NEXT SESSION: HOLD PT at this time, ankle eccentrics, foot/toe strengthening   Anjanette Gilkey, PT 01/28/2024, 9:08 AM   PHYSICAL THERAPY DISCHARGE SUMMARY  Visits from Start of Care: 7  Current functional level related to goals / functional outcomes: Improving activity tolerance   Remaining deficits: See above   Education / Equipment: HEP   Patient agrees to discharge. Patient goals were partially met. Patient is being discharged due to Pt to return to MD for next steps.  Darice Conine, PT,DPT11/25/2510:59 AM

## 2024-02-02 ENCOUNTER — Encounter: Admitting: Physical Therapy

## 2024-02-02 ENCOUNTER — Encounter: Payer: Self-pay | Admitting: Medical

## 2024-02-03 MED ORDER — ALPRAZOLAM 0.5 MG PO TABS: 0.5000 mg | ORAL_TABLET | Freq: Two times a day (BID) | ORAL | 0 refills | Status: DC | PRN

## 2024-02-03 NOTE — Telephone Encounter (Signed)
 Requesting: xanax  Contract:12/09/23 UDS:11/18/23 Last Visit:11/18/23 Next Visit:n/a Last Refill:11/18/23  Please Advise

## 2024-02-04 ENCOUNTER — Encounter: Admitting: Physical Therapy

## 2024-02-15 ENCOUNTER — Other Ambulatory Visit (HOSPITAL_BASED_OUTPATIENT_CLINIC_OR_DEPARTMENT_OTHER)

## 2024-02-18 ENCOUNTER — Encounter: Payer: Self-pay | Admitting: Podiatry

## 2024-02-18 ENCOUNTER — Ambulatory Visit: Admitting: Podiatry

## 2024-02-18 DIAGNOSIS — M7662 Achilles tendinitis, left leg: Secondary | ICD-10-CM

## 2024-02-18 NOTE — Progress Notes (Signed)
  Subjective:  Patient ID: Erik Cole, male    DOB: January 04, 1971,   MRN: [id number]  Chief Complaint  Patient presents with   Tendonitis    It's probably the same.    53 y.o. male presents for follow-up of left achilles tendonitis. Relates not much changes. Has been stretching and wearing lifts and taking the diclofenac  which helps but only while on it. He has been in PT and helps maybe a little but not much still getting achiness in the heel.  Denies any other pedal complaints. Denies n/v/f/c.   Past Medical History:  Diagnosis Date   Anxiety    mild, xanax  sparingly   Anxiety, mild 11/27/2010   Arthralgia of both hands 02/05/2021   Arthritis    generalized   De Quervain's disease (tenosynovitis) 01/08/2021   Dyslipidemia    Mainly hypertriglyceridemia     Fatigue 05/14/2021   GERD (gastroesophageal reflux disease)    hx of   Globus sensation 04/11/2013   IMO SNOMED Dx Update Oct 2024     Goiter    Healthcare maintenance 12/31/2011   History of chicken pox    History of colonic diverticulitis 04/17/2015   HLD (hyperlipidemia)    previous PCP stopped tricor  2/2 elevated LFTs 07/2010   Hot flashes 05/14/2021   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   Low serum cortisol level 08/11/2021   Low testosterone  08/11/2021   Lymphocytosis 01/08/2021   Myalgia 05/14/2021   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 CFT <3 seconds. Normal hair  growth on digits. No edema.  Skin. No lacerations or abrasions bilateral feet.  Musculoskeletal: MMT 5/5 bilateral lower extremities in DF, PF, Inversion and Eversion. Deceased ROM in DF of ankle joint. Tender to insertion of achilles tendon area. No pain along medial calcaneal tubercle or with calcaneal squeeze.  Neurological: Sensation intact to light touch.   Assessment:   1. Tendonitis, Achilles, left        Plan:  Patient was evaluated and treated and all questions answered. -Xrays reviewed. No acute fractures or dislocations. Spurring noted posterio calcaneus  -Discussed Achilles insertional tendonitis and treatment options with patient.  -Continue stretching.  -Discussed MRI but would like to hold off.  -Would like to consider PRP injection.  -Discussed if no improvement will consider MRI/EPAT/PRP injections.  -Patient to return to office in future for possible PRP Injection with ultrasound.    Asberry Failing, DPM

## 2024-02-18 NOTE — Patient Instructions (Signed)
 PRP injection of left achilles with ultrasound guidance and evaluation.

## 2024-02-25 ENCOUNTER — Encounter: Payer: Self-pay | Admitting: Internal Medicine

## 2024-02-25 ENCOUNTER — Ambulatory Visit: Payer: BC Managed Care – PPO | Admitting: Internal Medicine

## 2024-02-25 VITALS — BP 126/80 | HR 80 | Ht 71.0 in | Wt 214.0 lb

## 2024-02-25 DIAGNOSIS — D751 Secondary polycythemia: Secondary | ICD-10-CM

## 2024-02-25 DIAGNOSIS — E230 Hypopituitarism: Secondary | ICD-10-CM

## 2024-02-25 DIAGNOSIS — E893 Postprocedural hypopituitarism: Secondary | ICD-10-CM

## 2024-02-25 DIAGNOSIS — E038 Other specified hypothyroidism: Secondary | ICD-10-CM | POA: Diagnosis not present

## 2024-02-25 MED ORDER — LEVOTHYROXINE SODIUM 50 MCG PO TABS: 50.0000 ug | ORAL_TABLET | Freq: Every day | ORAL | 3 refills | Status: AC

## 2024-02-25 MED ORDER — SYRINGE/NEEDLE (DISP) 18G X 1" 3 ML MISC: 1.0000 | 3 refills | Status: AC

## 2024-02-25 MED ORDER — TESTOSTERONE CYPIONATE 200 MG/ML IJ SOLN: 0.7500 mL | INTRAMUSCULAR | 5 refills | Status: AC

## 2024-02-25 NOTE — Patient Instructions (Addendum)
 Please have your blood work drawn next Friday, August 22,2025 at [facility]

## 2024-02-25 NOTE — Progress Notes (Signed)
 Name: Erik Cole  MRN/ DOB: [id number], [birth year]    Age/ Sex: 53 y.o., male     PCP: Dorina Dallas RIGGERS   Reason for Endocrinology Evaluation: Pituitary macroadenoma     Initial Endocrinology Clinic Visit: 08/11/2021    PATIENT IDENTIFIER: Erik Cole is a 53 y.o., male with a past medical history of Pituitary macroadenoma , S/P transphenoidal sx 10/2021. He has followed with [facility] since 08/11/2021 for consultative assistance with management of his pituitary macroadenoma.   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  He is S/P transsphenoidal pituitary resection with Dr. Cheryle 11/05/2021  Postoperative MRI showed surgical changes   Preoperatively the patient had low serum cortisol at 1 UG/DL with a low normal ACTH  at 13 PG/mL, low LH 0.41 mIU/mL , low normal FSH, normal prolactin as well as TSH but low free T4 at 0.34 NG/DL.  Patient also has been noted with undetectable testosterone   He was started on LT-for replacement, and prednisone  by his previous endocrinologist   We discontinued hydrocortisone  05/14/2022 with an ACTH  of 29 PG/mL, cosyntropin  stimulation test was normal 09/2022  He was started on topical testosterone  therapy 12/2021, but this was switched to intramuscular by 04/2023   Testosterone  dose was decreased from 200 mg to 275 mg due to erythrocytosis in February, 2025  SUBJECTIVE:    Today (02/25/2024):  Mr. [name] is here for adrenal insufficiency and hypothyroidism.   He is S/P transsphenoidal pituitary resection in April 2023  Patient has been evaluated by podiatry for left Achilles tendinitis, considering PRP injection Patient had a follow-up with cardiology for dyslipidemia and palpitations, CT cardiac scoring was 260 this was 95th percentile for  age-race  He continues to follow-up with integrative provider  Patient has been noted with weight loss over the last 6 months  Denies local neck swelling  Denies palpitations  No recent  headaches except head cold a few weeks  Denies visual changes  Denies  ED   Last dose of Testosterone  2 weeks ago    Medication Levothyroxine  50 mcg daily Testosterone  cypionate 200mg /mL, 0.75 mL every 14 days    HISTORY:  Past Medical History:  Past Medical History:  Diagnosis Date   Anxiety    mild, xanax  sparingly   Anxiety, mild 11/27/2010   Arthralgia of both hands 02/05/2021   Arthritis    generalized   De Quervain's disease (tenosynovitis) 01/08/2021   Dyslipidemia    Mainly hypertriglyceridemia     Fatigue 05/14/2021   GERD (gastroesophageal reflux disease)    hx of   Globus sensation 04/11/2013   IMO SNOMED Dx Update Oct 2024     Goiter    Healthcare maintenance 12/31/2011   History of chicken pox    History of colonic diverticulitis 04/17/2015   HLD (hyperlipidemia)    previous PCP stopped tricor  2/2 elevated LFTs 07/2010   Hot flashes 05/14/2021   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   Low serum cortisol level 08/11/2021   Low testosterone  08/11/2021   Lymphocytosis 01/08/2021   Myalgia 05/14/2021   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   Obesity    Obesity, Class I, BMI 30-34.9 07/02/2011   Olecranon bursitis of both elbows 07/24/2021   Paresthesia of both hands 05/14/2021   Patellofemoral pain syndrome of right knee 03/12/2021   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   Snoring 07/24/2015   Thyroid  disease    Past Surgical History:  Past Surgical History:  Procedure Laterality Date   COLONOSCOPY  10/2015   one polyp, diverticulosis, rpt 5 yrs ([facility])   CRANIOTOMY  N/A 11/05/2021   Procedure: 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;  Surgeon: Cheryle Debby LABOR, MD;  Location: [facility] OR;  Service: Neurosurgery;  Laterality: N/A;   GANGLION CYST EXCISION Right 2008    wrist   PLACEMENT OF LUMBAR DRAIN N/A 11/05/2021   Procedure: PLACEMENT OF LUMBAR DRAIN;  Surgeon: Cheryle Debby LABOR, MD;  Location: [facility] OR;  Service: Neurosurgery;  Laterality: N/A;   TRANSPHENOIDAL APPROACH EXPOSURE N/A 11/05/2021   Procedure: TRANSPHENOIDAL APPROACH EXPOSURE;  Surgeon: Mable Lenis, MD;  Location[facility] OR;  Service: ENT;  Laterality: N/A;   Social History:  reports that he has never smoked. He has never used smokeless tobacco. He reports that he does not currently use alcohol. He reports that he does not use drugs. Family History:  Family History  Problem Relation Age of Onset   Hypertension Mother    Hyperlipidemia Father        TG   Diverticulitis Father    Diabetes Maternal Grandmother    Colon polyps Maternal Grandfather    Coronary artery disease Paternal Grandfather 66       unhealthy   Breast cancer Other 66       great grandmother, maternal   Stroke Neg Hx    Colon cancer Neg Hx    Esophageal cancer Neg Hx    Rectal cancer Neg Hx    Stomach cancer Neg Hx    Adrenal disorder Neg Hx      HOME MEDICATIONS: Allergies as of 02/25/2024   No Known Allergies      Medication List        Accurate as of February 25, 2024  8:08 AM. If you have any questions, ask your nurse or doctor.          ALPRAZolam  0.5 MG tablet Commonly known as: Xanax  Take 1 tablet (0.5 mg total) by mouth 2 (two) times daily as needed for anxiety.   CO Q 10 PO Take 1 tablet by mouth daily.   diclofenac  75 MG EC tablet Commonly known as: VOLTAREN  Take 1 tablet (75 mg total) by mouth 2 (two) times daily.   dicyclomine  10 MG capsule Commonly known as: BENTYL  Take 1 capsule (10 mg total) by mouth every 8 (eight) hours as needed for  spasms.   FISH OIL PO Take 1 capsule by mouth at bedtime.   fluticasone  50 MCG/ACT nasal spray Commonly known as: FLONASE  Place 2 sprays into both nostrils daily as needed for allergies or rhinitis.   GINGER PO Take 550 mg by mouth 2 (two) times daily.   hydrOXYzine  10 MG tablet Commonly known as: ATARAX  1-2 tab po at bedtime prn anxiety or insomnia   levothyroxine  50 MCG tablet Commonly known as: SYNTHROID  Take 1 tablet (50 mcg total) by mouth daily.   NEEDLE (DISP) 25 G 25G X 5/8 Misc 1 Device by Does not apply route every 14 (fourteen) days.   polyethylene glycol 17 g packet Commonly known as: MiraLax  Take 17 g by mouth daily as needed.   rosuvastatin  20 MG tablet Commonly known as: CRESTOR  Take 1 tablet (20 mg  total) by mouth daily.   Syringe/Needle (Disp) 18G X 1 3 ML Misc 1 Device by Does not apply route every 14 (fourteen) days.   Testosterone  Cypionate 200 MG/ML Soln Inject 0.75 mLs as directed every 14 (fourteen) days.   venlafaxine  XR 37.5 MG 24 hr capsule Commonly known as: Effexor  XR Take 1 capsule (37.5 mg total) by mouth daily with breakfast.   venlafaxine  XR 75 MG 24 hr capsule Commonly known as: Effexor  XR Take 1 capsule (75 mg total) by mouth daily with breakfast.   Vitamin D  125 MCG (5000 UT) Caps Take 5,000 Units by mouth daily at 6 (six) AM.          OBJECTIVE:   PHYSICAL EXAM: VS: BP 126/80 (BP Location: Left Arm, Patient Position: Sitting, Cuff Size: Normal)   Pulse 80   Ht 5' 11 (1.803 m)   Wt 214 lb (97.1 kg)   SpO2 96%   BMI 29.85 kg/m    Body surface area is 2.21 meters squared.   Filed Weights   02/25/24 0804  Weight: 214 lb (97.1 kg)      EXAM: General: Pt appears well and is in NAD  Eyes: External eye exam normal   Lungs: Clear with good BS bilat   Heart: Auscultation: RRR.  Abdomen: Soft, nontender  Extremities:  BL LE: No pretibial edema  Mental Status: Judgment, insight: Intact Orientation: Oriented  to time, place, and person Mood and affect: No depression, anxiety, or agitation     DATA REVIEWED:    Latest Reference Range & Units 11/12/23 08:35  Testosterone , total 264.0 - 916.0 ng/dL 204.6     87/68/7975  RBC 6.1 H/H 16.6/52.9 GFR 77 A1c 5.3% TSH 1.25 Estradiol 32.8 (7.6-42.6) Testosterone   475     11/21/2021  Testosterone  9 ng/dL  FT4 9.16 ng/dL TSH 8.319 uIU/mL  LH 0.8 mIU/mL  FSH 1.5 ACTH  34.5 pg/mL  IGF-1 169 ng/mL  AM cortisol 10.6 ug/dL     Cosyntropin  Stim test 09/11/2022  Latest Reference Range & Units 09/11/22 07:41 09/11/22 08:28 09/11/22 08:58  Cortisol, Plasma ug/dL 89.6 81.2 80.3       MRI Brain 07/10/2022 Brain: Chronic tumoral expansion of the sella with resolution of postoperative blood products. Residual enhancing tissue mainly in the right aspect of the sella, contiguous with the rightward deflected infundibulum, likely normal pituitary. 3 x 4 mm cystic space in the left sella without mass effect or invasive features. No residual enhancing tumor seen in the left sella. Downward positioning of the chiasm after tumor decompression, no tumor mass effect on the chiasm.   No infarct, hemorrhage, hydrocephalus, or collection.   Vascular: Major flow voids and vascular enhancements are preserved   Skull and upper cervical spine: Normal with marrow signal.   Sinuses/Orbits: Negative   IMPRESSION: Resolution of postoperative blood products, only a small cystic space remains in the left para median sella. No tumoral mass effect on adjacent structures.    Old records , labs and images have been reviewed.   ASSESSMENT / PLAN / RECOMMENDATIONS:   Hx pituitary macroadenoma, S/P transphenoidal pituitary resection 10/2021  - He is S/P transsphenoidal pituitary resection in April, 2023 - MRI 06/2022 showed resolution of post-op blood products    2.  Secondary hypothyroidism  - Patient is clinically euthyroid - No local neck  symptoms - Patient will have TFTs done through [facility] next week, orders were printed   Medication Continue levothyroxine  50 mcg daily     3.  Hypogonadotropic hypogonadism:   -He was on oral  testosterone  replacement therapy  through integrative health without benefit -His fasting testosterone  was low at 177.51 NG/DL 89/7976 -He was started on topical testosterone  replacement therapy 12/2021 -Patient was switched to intramuscular injection by 03/2023 -We had decreased testosterone  dose due to elevated H/H in February, 2025 -Patient will have testosterone  drawn next Friday through [facility], lab orders were printed   Medication  Continue testosterone  cypionate 200 Mg/mL,  0.75 mL every 14 days    4. Erythrocytosis:  - This is due to testosterone  therapy  -We decreased testosterone  due to elevated H&H -Patient asymptomatic -Will recheck CBC next Friday, lab orders were provided for [facility]  Follow-up in 6 months    Signed electronically by: Stefano Redgie Butts, MD  [facility]  [facility] [address] [place] [place], [place] [address] Phone: [phone] FAX: [phone]      CC: Dorina Dallas RIGGERS [address] [place] [address] Phone: [phone]  Fax: [phone]   Return to Endocrinology clinic as below: Future Appointments  Date Time Provider Department Center  02/25/2024  8:10 AM Jaquarious Grey, Donell Redgie, MD [id number] None

## 2024-03-07 ENCOUNTER — Ambulatory Visit: Payer: Self-pay | Admitting: Internal Medicine

## 2024-03-07 LAB — CBC
Hematocrit: 55.6 % — ABNORMAL HIGH (ref 37.5–51.0)
Hemoglobin: 17.4 g/dL (ref 13.0–17.7)
MCH: 28 pg (ref 26.6–33.0)
MCHC: 31.3 g/dL — ABNORMAL LOW (ref 31.5–35.7)
MCV: 89 fL (ref 79–97)
Platelets: 234 x10E3/uL (ref 150–450)
RBC: 6.22 x10E6/uL — ABNORMAL HIGH (ref 4.14–5.80)
RDW: 13.5 % (ref 11.6–15.4)
WBC: 5.9 x10E3/uL (ref 3.4–10.8)

## 2024-03-07 LAB — TSH: TSH: 1.18 u[IU]/mL (ref 0.450–4.500)

## 2024-03-07 LAB — TESTOSTERONE, TOTAL, LC/MS/MS: Testosterone, total: 720.2 ng/dL (ref 264.0–916.0)

## 2024-03-07 LAB — T4, FREE: Free T4: 1.39 ng/dL (ref 0.82–1.77)

## 2024-03-27 ENCOUNTER — Other Ambulatory Visit (HOSPITAL_COMMUNITY): Payer: Self-pay

## 2024-03-27 ENCOUNTER — Telehealth: Payer: Self-pay

## 2024-03-27 NOTE — Telephone Encounter (Signed)
 Pharmacy Patient Advocate Encounter   Received notification from CoverMyMeds that prior authorization for Testosterone  Cypionate 200MG /ML intramuscular solution is required/requested.   Insurance verification completed.   The patient is insured through [employer] .   Per test claim: The current 30 day co-pay is, $15.  No PA needed at this time. This test claim was processed through [facility]- copay amounts may vary at other pharmacies due to pharmacy/plan contracts, or as the patient moves through the different stages of their insurance plan.

## 2024-03-28 ENCOUNTER — Encounter: Payer: Self-pay | Admitting: Internal Medicine

## 2024-03-29 ENCOUNTER — Other Ambulatory Visit: Payer: Self-pay

## 2024-03-29 MED ORDER — NEEDLE (DISP) 25G X 5/8" MISC: 1.0000 | 3 refills | Status: AC

## 2024-04-10 ENCOUNTER — Other Ambulatory Visit: Payer: Self-pay | Admitting: Podiatry

## 2024-05-04 ENCOUNTER — Other Ambulatory Visit: Payer: Self-pay | Admitting: Family

## 2024-05-07 ENCOUNTER — Encounter: Payer: Self-pay | Admitting: Medical

## 2024-05-08 ENCOUNTER — Other Ambulatory Visit: Payer: Self-pay

## 2024-05-08 ENCOUNTER — Telehealth: Payer: Self-pay | Admitting: Medical

## 2024-05-08 MED ORDER — ALPRAZOLAM 0.5 MG PO TABS: 0.5000 mg | ORAL_TABLET | Freq: Two times a day (BID) | ORAL | 0 refills | Status: AC | PRN

## 2024-05-08 NOTE — Telephone Encounter (Signed)
 Requesting: xanax  Contract: Yes 12/09/23 UDS: 11/18/23 Last Visit: 11/18/2023 Next Visit: Visit date not found Last Refill: 09/06/23   Rx sent to pt pharmacy. Get him scheduled for 6 month controlled med visit please.

## 2024-05-08 NOTE — Telephone Encounter (Signed)
 Called pt got him scheduled for 6 month med visit November 5th

## 2024-05-12 ENCOUNTER — Ambulatory Visit: Admitting: Medical

## 2024-05-12 VITALS — BP 122/80 | HR 75 | Temp 98.1°F | Resp 15 | Ht 71.0 in | Wt 212.8 lb

## 2024-05-12 DIAGNOSIS — E038 Other specified hypothyroidism: Secondary | ICD-10-CM | POA: Diagnosis not present

## 2024-05-12 DIAGNOSIS — Z79899 Other long term (current) drug therapy: Secondary | ICD-10-CM | POA: Diagnosis not present

## 2024-05-12 DIAGNOSIS — M7662 Achilles tendinitis, left leg: Secondary | ICD-10-CM

## 2024-05-12 DIAGNOSIS — F419 Anxiety disorder, unspecified: Secondary | ICD-10-CM

## 2024-05-12 DIAGNOSIS — R972 Elevated prostate specific antigen [PSA]: Secondary | ICD-10-CM

## 2024-05-12 DIAGNOSIS — Z86018 Personal history of other benign neoplasm: Secondary | ICD-10-CM

## 2024-05-12 DIAGNOSIS — E785 Hyperlipidemia, unspecified: Secondary | ICD-10-CM

## 2024-05-12 NOTE — Progress Notes (Signed)
 Subjective:    Patient ID: Erik Cole, male    DOB: 08/03/1970, 53 y.o.   MRN: [id number]  HPI   Last AVS below in   Anxiety -improved with effexor . Occasional rare high anxiety at work requiring rare use of xanax . Recently stress at work more than had been in past. Continue 75 mg effexor  daily. Refilled xanax . Update contract and got uds today.  -Can continue rare use of hydroxyzine  for anxiety and insomnia at night.    Will follow cardiac study results when cardiologist send those to me.    Erik Cole is a 53 year old male who presents for a medication check and follow-up on multiple medical conditions.  His anxiety, previously requiring daily medication, is now more situational and less frequent. He has tapered off Effexor  over the past two months. No only using xanax  very sparingly for rare high level anxiety/near panic attacks. Often times will use if flies or public speaks. Other times if work stress and other issues occur.  He has a history of left Achilles tendinitis, which flares up occasionally, causing difficulty walking. He uses diclofenac  as needed for pain management. He has undergone physical therapy and is scheduled for a PRP injection with a podiatrist.  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 He reports that his previous neurosurgeon wanted him to have yearly MRIs after his 2023 surgery, and he recalls having MRIs at the end of 2023 and 2024. He is seeking a new neurosurgeon for continued monitoring.  He has a history of elevated coronary calcium  score of 260, placing him in the 94th percentile. He continues to take Crestor  and follows a low-fat, high-fiber diet. Recent lab results show improved cholesterol levels.  He is on levothyroxine  for thyroid  management, with recent thyroid  function tests showing normal TSH and T4 levels.  He has a history of increased prostate-specific antigen (PSA) levels, with a  recent value of 3.20.     Review of Systems  Constitutional:  Negative for chills and fatigue.  Respiratory:  Negative for chest tightness, shortness of breath and wheezing.   Cardiovascular:  Negative for chest pain and palpitations.  Gastrointestinal:  Negative for anal bleeding.  Musculoskeletal:  Negative for back pain, myalgias and neck stiffness.  Skin:  Negative for rash.  Neurological:  Negative for dizziness, tremors and light-headedness.  Hematological:  Negative for adenopathy.  Psychiatric/Behavioral:  Negative for behavioral problems and hallucinations. The patient is nervous/anxious.     Past Medical History:  Diagnosis Date   Anxiety    mild, xanax  sparingly   Anxiety, mild 11/27/2010   Arthralgia of both hands 02/05/2021   Arthritis    generalized   De Quervain's disease (tenosynovitis) 01/08/2021   Dyslipidemia    Mainly hypertriglyceridemia     Fatigue 05/14/2021   GERD (gastroesophageal reflux disease)    hx of   Globus sensation 04/11/2013   IMO SNOMED Dx Update Oct 2024     Goiter    Healthcare maintenance 12/31/2011   History of chicken pox    History of colonic diverticulitis 04/17/2015   HLD (hyperlipidemia)    previous PCP stopped tricor  2/2 elevated LFTs 07/2010   Hot flashes 05/14/2021   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   Low serum cortisol level 08/11/2021   Low testosterone  08/11/2021   Lymphocytosis 01/08/2021   Myalgia 05/14/2021   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   Obesity    Obesity, Class I, BMI 30-34.9 07/02/2011  Olecranon bursitis of both elbows 07/24/2021   Paresthesia of both hands 05/14/2021   Patellofemoral pain syndrome of right knee 03/12/2021   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/2022   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   Snoring 07/24/2015   Thyroid  disease      Social History    Socioeconomic History   Marital status: Married    Spouse name: Not on file   Number of children: 2   Years of education: college   Highest education level: Bachelor's degree (e.g., BA, AB, BS)  Occupational History   Occupation: Dealer: OTHER    Comment: [employer]  Tobacco Use   Smoking status: Never   Smokeless tobacco: Never  Vaping Use   Vaping status: Never Used  Substance and Sexual Activity   Alcohol use: Not Currently    Comment: Rare   Drug use: No   Sexual activity: Not on file  Other Topics Concern   Not on file  Social History Narrative   Caffeine: occasionally.Lives with wife Oscar), 2 children (son and daughter), 1 catOccupation: Activity: no regular exerciseDiet: good water, daily fruits/vegetables, red meat rarely, fish 1x/wk, cut out fast food      Writes with left hand and does things with his right hand.      Lives in a two story home    Social Drivers of Health   Financial Resource Strain: Low Risk  (05/11/2024)   Overall Financial Resource Strain (CARDIA)    Difficulty of Paying Living Expenses: Not very hard  Food Insecurity: No Food Insecurity (05/11/2024)   Hunger Vital Sign    Worried About Running Out of Food in the Last Year: Never true    Ran Out of Food in the Last Year: Never true  Transportation Needs: No Transportation Needs (05/11/2024)   PRAPARE - Administrator, Civil Service (Medical): No    Lack of Transportation (Non-Medical): No  Physical Activity: Sufficiently Active (05/11/2024)   Exercise Vital Sign    Days of Exercise per Week: 5 days    Minutes of Exercise per Session: 30 min  Stress: No Stress Concern Present (05/11/2024)   [employer] of Occupational Health - Occupational Stress Questionnaire    Feeling of Stress: Not at all  Social Connections: Moderately Integrated (05/11/2024)   Social Connection and Isolation Panel    Frequency of Communication with Friends and Family: Once  a week    Frequency of Social Gatherings with Friends and Family: Once a week    Attends Religious Services: More than 4 times per year    Active Member of Clubs or Organizations: Yes    Attends Banker Meetings: More than 4 times per year    Marital Status: Married  Catering Manager Violence: Not on file    Past Surgical History:  Procedure Laterality Date   COLONOSCOPY  10/2015   one polyp, diverticulosis, rpt 5 yrs ([facility])   CRANIOTOMY N/A 11/05/2021   Procedure: 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;  Surgeon: Cheryle Debby LABOR, MD;  Location: [facility] OR;  Service: Neurosurgery;  Laterality: N/A;   GANGLION CYST EXCISION Right 2008    wrist   PLACEMENT OF LUMBAR DRAIN N/A 11/05/2021   Procedure: PLACEMENT OF LUMBAR DRAIN;  Surgeon: Cheryle Debby LABOR, MD;  Location: [facility] OR;  Service: Neurosurgery;  Laterality: N/A;   TRANSPHENOIDAL APPROACH EXPOSURE N/A  11/05/2021   Procedure: TRANSPHENOIDAL APPROACH EXPOSURE;  Surgeon: Mable Lenis, MD;  Location: [facility] OR;  Service: ENT;  Laterality: N/A;    Family History  Problem Relation Age of Onset   Hypertension Mother    Hyperlipidemia Father        TG   Diverticulitis Father    Diabetes Maternal Grandmother    Colon polyps Maternal Grandfather    Coronary artery disease Paternal Grandfather 18       unhealthy   Breast cancer Other 87       great grandmother, maternal   Stroke Neg Hx    Colon cancer Neg Hx    Esophageal cancer Neg Hx    Rectal cancer Neg Hx    Stomach cancer Neg Hx    Adrenal disorder Neg Hx     No Known Allergies  Current Outpatient Medications on File Prior to Visit  Medication Sig Dispense Refill   ALPRAZolam  (XANAX ) 0.5 MG tablet Take 1 tablet (0.5 mg total) by mouth 2 (two) times daily as needed for anxiety. 10 tablet 0   Cholecalciferol  (VITAMIN D ) 125 MCG (5000 UT) CAPS Take 5,000 Units by mouth daily at 6 (six) AM.     Coenzyme Q10 (CO Q 10 PO)  Take 1 tablet by mouth daily.     diclofenac  (VOLTAREN ) 75 MG EC tablet TAKE 1 TABLET BY MOUTH TWICE A DAY 30 tablet 0   dicyclomine  (BENTYL ) 10 MG capsule Take 1 capsule (10 mg total) by mouth every 8 (eight) hours as needed for spasms. 30 capsule 3   Ginger, Zingiber officinalis, (GINGER PO) Take 550 mg by mouth 2 (two) times daily.     hydrOXYzine  (ATARAX ) 10 MG tablet 1-2 tab po at bedtime prn anxiety or insomnia 30 tablet 0   levothyroxine  (SYNTHROID ) 50 MCG tablet Take 1 tablet (50 mcg total) by mouth daily. 90 tablet 3   NEEDLE, DISP, 25 G 25G X 5/8 MISC 1 Device by Does not apply route every 14 (fourteen) days. 10 each 3   Omega-3 Fatty Acids  (FISH OIL PO) Take 1 capsule by mouth at bedtime.     polyethylene glycol (MIRALAX ) 17 g packet Take 17 g by mouth daily as needed. 14 each 0   rosuvastatin  (CRESTOR ) 20 MG tablet Take 1 tablet (20 mg total) by mouth daily. 90 tablet 3   Syringe/Needle, Disp, 18G X 1 3 ML MISC 1 Device by Does not apply route every 14 (fourteen) days. 10 each 3   Testosterone  Cypionate 200 MG/ML SOLN Inject 0.75 mLs as directed every 14 (fourteen) days. 2 mL 5   No current facility-administered medications on file prior to visit.    BP 122/80   Pulse 75   Temp 98.1 F (36.7 C) (Oral)   Resp 15   Ht 5' 11 (1.803 m)   Wt 212 lb 12.8 oz (96.5 kg)   SpO2 95%   BMI 29.68 kg/m        Objective:   Physical Exam  General Mental Status- Alert. General Appearance- Not in acute distress.   Skin General: Color- Normal Color. Moisture- Normal Moisture.  Neck Carotid Arteries- Normal color. Moisture- Normal Moisture. No carotid bruits. No JVD.  Chest and Lung Exam Auscultation: Breath Sounds:-CTA  Cardiovascular Auscultation:Rythm- RRR Murmurs & Other Heart Sounds:Auscultation of the heart reveals- No Murmurs.  Abdomen Inspection:-Inspeection Normal. Palpation/Percussion:Note:No mass. Palpation and Percussion of the abdomen reveal- Non Tender, Non  Distended + BS, no rebound or guarding.  Neurologic Cranial Nerve exam:- CN III-XII intact(No nystagmus), symmetric smile. Strength:- 5/5 equal and symmetric strength both upper and lower extremities.       Assessment & Plan:   Pituitary macroadenoma, post-surgical, under surveillance Post-surgical surveillance with yearly MRIs as per previous neurosurgeon's recommendation. Original neurosurgeon unavailable. - Refer to [facility] for follow-up and MRI. - If no response from [facility], consider referral to another neurosurgeon.  Elevated prostate specific antigen (PSA) Elevated PSA, previously 3.20, approaching 4. Monitored by [facility] with no current issues. - Order PSA test within the next month. - Schedule lab appointment for PSA test. - Fax PSA results to [facility] for further evaluation.  Hyperlipidemia Managed with Crestor  and lifestyle modifications. Coronary calcium  score of 260, 94th percentile, indicating higher cardiovascular risk. - Continue Crestor . - Maintain low-fat, high-fiber diet. - Continue fish oil or krill oil supplementation.  Hypothyroidism Well-controlled with normal TSH and T4 levels. Continues levothyroxine . - Continue levothyroxine .  Situational anxiety Well-controlled with occasional episodes. Tapered off venlafaxine , uses alprazolam  sparingly. - Discontinue venlafaxine . - Continue alprazolam  0.5 mg as needed, with a prescription of 10 tablets.  Left Achilles tendinitis Occasional flare-ups likely due to bone spurs. Uses diclofenac  for pain, has undergone physical therapy. - Continue diclofenac  as needed for pain. - Proceed with PRP injection by podiatrist.  Follow up in 6 months or sooner if needed

## 2024-05-12 NOTE — Patient Instructions (Signed)
 Pituitary macroadenoma, post-surgical, under surveillance Post-surgical surveillance with yearly MRIs as per previous neurosurgeon's recommendation. Original neurosurgeon unavailable. - Refer to [facility] for follow-up and MRI. - If no response from [facility], consider referral to another neurosurgeon.  Elevated prostate specific antigen (PSA) Elevated PSA, previously 3.20, approaching 4. Monitored by [facility] with no current issues. - Order PSA test within the next month. - Schedule lab appointment for PSA test. - Fax PSA results to [facility] for further evaluation.  Hyperlipidemia Managed with Crestor  and lifestyle modifications. Coronary calcium  score of 260, 94th percentile, indicating higher cardiovascular risk. - Continue Crestor . - Maintain low-fat, high-fiber diet. - Continue fish oil or krill oil supplementation.  Hypothyroidism Well-controlled with normal TSH and T4 levels. Continues levothyroxine . - Continue levothyroxine .  Situational anxiety Well-controlled with occasional episodes. Tapered off venlafaxine , uses alprazolam  sparingly. - Discontinue venlafaxine . - Continue alprazolam  0.5 mg as needed, with a prescription of 10 tablets.  Left Achilles tendinitis Occasional flare-ups likely due to bone spurs. Uses diclofenac  for pain, has undergone physical therapy. - Continue diclofenac  as needed for pain. - Proceed with PRP injection by podiatrist.  Follow up in 6 months or sooner if needed

## 2024-05-15 ENCOUNTER — Encounter

## 2024-05-15 ENCOUNTER — Ambulatory Visit: Admitting: Podiatry

## 2024-05-17 ENCOUNTER — Ambulatory Visit: Admitting: Medical

## 2024-05-29 ENCOUNTER — Ambulatory Visit (INDEPENDENT_AMBULATORY_CARE_PROVIDER_SITE_OTHER): Admitting: Podiatry

## 2024-05-29 DIAGNOSIS — M7662 Achilles tendinitis, left leg: Secondary | ICD-10-CM

## 2024-05-29 NOTE — Patient Instructions (Signed)

## 2024-05-29 NOTE — Progress Notes (Signed)
  Subjective:  Patient ID: Erik Cole, male    DOB: November 01, 1970,   MRN: [id number]  Chief Complaint  Patient presents with   PRP w/ US     53 y.o. male presents for follow-up of left achilles tendonitis.  Care for PRP injection and ultrasound.  Denies any other pedal complaints. Denies n/v/f/c.   Past Medical History:  Diagnosis Date   Anxiety    mild, xanax  sparingly   Anxiety, mild 11/27/2010   Arthralgia of both hands 02/05/2021   Arthritis    generalized   De Quervain's disease (tenosynovitis) 01/08/2021   Dyslipidemia    Mainly hypertriglyceridemia     Fatigue 05/14/2021   GERD (gastroesophageal reflux disease)    hx of   Globus sensation 04/11/2013   IMO SNOMED Dx Update Oct 2024     Goiter    Healthcare maintenance 12/31/2011   History of chicken pox    History of colonic diverticulitis 04/17/2015   HLD (hyperlipidemia)    previous PCP stopped tricor  2/2 elevated LFTs 07/2010   Hot flashes 05/14/2021   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   Low serum cortisol level 08/11/2021   Low testosterone  08/11/2021   Lymphocytosis 01/08/2021   Myalgia 05/14/2021   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 CFT <3 seconds. Normal hair growth on digits. No edema.  Skin. No lacerations or abrasions bilateral feet.  Musculoskeletal: MMT 5/5 bilateral lower extremities in DF, PF, Inversion and Eversion. Deceased ROM in DF of ankle joint. Tender to insertion of achilles  tendon area. No pain along medial calcaneal tubercle or with calcaneal squeeze.  Neurological: Sensation intact to light touch.   Assessment:   1. Tendonitis, Achilles, left        Plan:  Patient was evaluated and treated and all questions answered. -Xrays reviewed. No acute fractures or dislocations. Spurring noted posterio calcaneus  -Discussed Achilles insertional tendonitis and treatment options with patient.  -Continue stretching.  - PRP injection provided procedure below -Ultrasound performed procedure below -Discussed if no improvement will consider MRI/surgery  -Patient to return to office in 4 weeks for recheck  Procedure: Injection Tendon/Ligament Discussed alternatives, risks, complications and verbal consent was obtained.  Location: Left Achilles tendon. Skin Prep: Betadine. Injectate: 3 cc 2% lidocaine  plain, 5 cc PRP injectate Disposition: Patient tolerated procedure well. Injection site dressed with a band-aid.  Post-injection care was discussed and return precautions discussed.   Ultrsound: Butterfly ultrasound machine used to evaluate left Achilles tendon.  Anatomical structures identified no tears or acute ruptures noted.  Increase in thickness of Achilles tendon noted and mild increase in edema within the tendon noted.    Asberry Failing, DPM

## 2024-05-31 ENCOUNTER — Encounter: Payer: Self-pay | Admitting: Podiatry

## 2024-06-02 ENCOUNTER — Other Ambulatory Visit (HOSPITAL_BASED_OUTPATIENT_CLINIC_OR_DEPARTMENT_OTHER): Payer: Self-pay | Admitting: Neurosurgery

## 2024-06-02 DIAGNOSIS — D352 Benign neoplasm of pituitary gland: Secondary | ICD-10-CM

## 2024-06-30 ENCOUNTER — Encounter: Payer: Self-pay | Admitting: Podiatry

## 2024-06-30 ENCOUNTER — Ambulatory Visit: Admitting: Podiatry

## 2024-06-30 DIAGNOSIS — M7662 Achilles tendinitis, left leg: Secondary | ICD-10-CM | POA: Diagnosis not present

## 2024-06-30 NOTE — Progress Notes (Signed)
"  °  Subjective:  Patient ID: Erik Cole, male    DOB: [birth year],   MRN: [id number]  Chief Complaint  Patient presents with   Tendonitis    I don't know.  It seems to be okay.  It seemed to have started acting up yesterday.  I have a weird sensation on one side.    53 y.o. male presents for follow-up of left achilles tendonitis.  Follow-up after PRP injection. Relates seems to be doing ok was doing some better and then yesterday acted up again.  Denies any other pedal complaints. Denies n/v/f/c.   Past Medical History:  Diagnosis Date   Anxiety    mild, xanax  sparingly   Anxiety, mild 11/27/2010   Arthralgia of both hands 02/05/2021   Arthritis    generalized   De Quervain's disease (tenosynovitis) 01/08/2021   Dyslipidemia    Mainly hypertriglyceridemia     Fatigue 05/14/2021   GERD (gastroesophageal reflux disease)    hx of   Globus sensation 04/11/2013   IMO SNOMED Dx Update Oct 2024     Goiter    Healthcare maintenance 12/31/2011   History of chicken pox    History of colonic diverticulitis 04/17/2015   HLD (hyperlipidemia)    previous PCP stopped tricor  2/2 elevated LFTs 07/2010   Hot flashes 05/14/2021   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   Low serum cortisol level 08/11/2021   Low testosterone  08/11/2021   Lymphocytosis 01/08/2021   Myalgia 05/14/2021   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 CFT <3 seconds. Normal hair growth on digits. No edema.   Skin. No lacerations or abrasions bilateral feet.  Musculoskeletal: MMT 5/5 bilateral lower extremities in DF, PF, Inversion and Eversion. Deceased ROM in DF of ankle joint.  Minimally tender to insertion of achilles tendon area. No pain along medial calcaneal tubercle or with calcaneal squeeze.  Neurological: Sensation intact to light touch.   Assessment:   1. Tendonitis, Achilles, left         Plan:  Patient was evaluated and treated and all questions answered. -Xrays reviewed. No acute fractures or dislocations. Spurring noted posterio calcaneus  -Discussed Achilles insertional tendonitis and treatment options with patient.  -Continue stretching.  - Advised PRP injection will continue to work and hopefully will improve things in the next 4 weeks.  If not can consider another injection.  Also discussed possibility for surgery in the future if needed.  Patient would like to hold off on this if at all possible. -Discussed if no improvement will consider MRI/surgery  -Patient to return to office in 4 weeks for recheck     Asberry Failing, DPM    "

## 2024-08-04 ENCOUNTER — Ambulatory Visit: Admitting: Podiatry

## 2024-08-04 ENCOUNTER — Encounter: Payer: Self-pay | Admitting: Podiatry

## 2024-08-04 DIAGNOSIS — M7662 Achilles tendinitis, left leg: Secondary | ICD-10-CM | POA: Diagnosis not present

## 2024-08-04 NOTE — Progress Notes (Signed)
"  °  Subjective:  Patient ID: Erik Erik, male    DOB: May 17, 1971,   MRN: [id number]  Chief Complaint  Patient presents with   Tendonitis    It's better.    54 y.o. male presents for follow-up of left achilles tendonitis.  Follow-up after PRP injection. 8 weeks out and relates it is doing better.   Denies any other pedal complaints. Denies n/v/f/c.   Past Medical History:  Diagnosis Date   Anxiety    mild, xanax  sparingly   Anxiety, mild 11/27/2010   Arthralgia of both hands 02/05/2021   Arthritis    generalized   De Quervain's disease (tenosynovitis) 01/08/2021   Dyslipidemia    Mainly hypertriglyceridemia     Fatigue 05/14/2021   GERD (gastroesophageal reflux disease)    hx of   Globus sensation 04/11/2013   IMO SNOMED Dx Update Oct 2024     Goiter    Healthcare maintenance 12/31/2011   History of chicken pox    History of colonic diverticulitis 04/17/2015   HLD (hyperlipidemia)    previous PCP stopped tricor  2/2 elevated LFTs 07/2010   Hot flashes 05/14/2021   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   Low serum cortisol level 08/11/2021   Low testosterone  08/11/2021   Lymphocytosis 01/08/2021   Myalgia 05/14/2021   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 CFT <3 seconds. Normal hair growth on digits. No edema.  Skin. No lacerations or abrasions bilateral feet.  Musculoskeletal: MMT 5/5 bilateral lower extremities in DF, PF, Inversion and Eversion. Deceased  ROM in DF of ankle joint.  Minimally tender to insertion of achilles tendon area. No pain along medial calcaneal tubercle or with calcaneal squeeze.  Neurological: Sensation intact to light touch.   Assessment:   1. Tendonitis, Achilles, left         Plan:  Patient was evaluated and treated and all questions answered. -Xrays reviewed. No acute fractures or dislocations. Spurring noted posterio calcaneus  -Discussed Achilles insertional tendonitis and treatment options with patient.  -Continue stretching.  - Will hold of on injections and surgery for now but if any worsening int he future advised these may be options.  -Discussed if no improvement will consider MRI/surgery  -Patient to return to office as needed   Asberry Failing, DPM    "

## 2024-09-13 ENCOUNTER — Ambulatory Visit: Admitting: Internal Medicine
